# Patient Record
Sex: Male | Born: 1935 | Race: Black or African American | Hispanic: No | Marital: Married | Smoking: Former smoker
Health system: Southern US, Community
[De-identification: ages and names within clinical notes are randomized; demographics above are authoritative.]

## PROBLEM LIST (undated history)

## (undated) DIAGNOSIS — M1990 Unspecified osteoarthritis, unspecified site: Secondary | ICD-10-CM

## (undated) DIAGNOSIS — I10 Essential (primary) hypertension: Secondary | ICD-10-CM

## (undated) DIAGNOSIS — I429 Cardiomyopathy, unspecified: Secondary | ICD-10-CM

## (undated) DIAGNOSIS — I259 Chronic ischemic heart disease, unspecified: Secondary | ICD-10-CM

## (undated) DIAGNOSIS — I350 Nonrheumatic aortic (valve) stenosis: Secondary | ICD-10-CM

## (undated) DIAGNOSIS — E785 Hyperlipidemia, unspecified: Secondary | ICD-10-CM

## (undated) DIAGNOSIS — I2510 Atherosclerotic heart disease of native coronary artery without angina pectoris: Secondary | ICD-10-CM

## (undated) DIAGNOSIS — E119 Type 2 diabetes mellitus without complications: Secondary | ICD-10-CM

## (undated) DIAGNOSIS — I739 Peripheral vascular disease, unspecified: Secondary | ICD-10-CM

## (undated) HISTORY — DX: Unspecified osteoarthritis, unspecified site: M19.90

## (undated) HISTORY — DX: Peripheral vascular disease, unspecified: I73.9

## (undated) HISTORY — DX: Atherosclerotic heart disease of native coronary artery without angina pectoris: I25.10

## (undated) HISTORY — PX: NO PAST SURGERIES: SHX2092

## (undated) HISTORY — DX: Type 2 diabetes mellitus without complications: E11.9

## (undated) HISTORY — DX: Essential (primary) hypertension: I10

## (undated) HISTORY — DX: Nonrheumatic aortic (valve) stenosis: I35.0

## (undated) HISTORY — DX: Chronic ischemic heart disease, unspecified: I25.9

## (undated) HISTORY — DX: Cardiomyopathy, unspecified: I42.9

## (undated) HISTORY — DX: Hyperlipidemia, unspecified: E78.5

---

## 2003-09-22 ENCOUNTER — Ambulatory Visit (HOSPITAL_COMMUNITY): Admission: RE | Admit: 2003-09-22 | Discharge: 2003-09-22 | Payer: Self-pay | Admitting: Internal Medicine

## 2004-02-29 ENCOUNTER — Ambulatory Visit: Payer: Self-pay

## 2005-12-06 ENCOUNTER — Ambulatory Visit: Payer: Self-pay | Admitting: Cardiovascular Disease

## 2006-05-14 ENCOUNTER — Ambulatory Visit: Payer: Self-pay | Admitting: Cardiovascular Disease

## 2006-05-15 ENCOUNTER — Ambulatory Visit: Payer: Self-pay | Admitting: Vascular Surgery

## 2006-05-22 ENCOUNTER — Encounter (HOSPITAL_COMMUNITY): Admission: RE | Admit: 2006-05-22 | Discharge: 2006-06-21 | Payer: Self-pay | Admitting: Cardiovascular Disease

## 2006-05-22 ENCOUNTER — Ambulatory Visit: Payer: Self-pay | Admitting: Cardiovascular Disease

## 2006-07-05 ENCOUNTER — Ambulatory Visit: Payer: Self-pay | Admitting: Cardiovascular Disease

## 2006-12-04 ENCOUNTER — Ambulatory Visit: Payer: Self-pay | Admitting: Vascular Surgery

## 2007-01-28 ENCOUNTER — Ambulatory Visit: Payer: Self-pay | Admitting: Cardiovascular Disease

## 2007-06-11 ENCOUNTER — Ambulatory Visit: Payer: Self-pay | Admitting: Vascular Surgery

## 2007-07-31 ENCOUNTER — Ambulatory Visit: Payer: Self-pay | Admitting: Cardiovascular Disease

## 2007-08-05 ENCOUNTER — Emergency Department (HOSPITAL_COMMUNITY): Admission: EM | Admit: 2007-08-05 | Discharge: 2007-08-05 | Payer: Self-pay | Admitting: Emergency Medicine

## 2007-12-17 ENCOUNTER — Ambulatory Visit: Payer: Self-pay | Admitting: Vascular Surgery

## 2008-03-05 ENCOUNTER — Ambulatory Visit: Payer: Self-pay | Admitting: Cardiology

## 2008-03-06 DIAGNOSIS — I2510 Atherosclerotic heart disease of native coronary artery without angina pectoris: Secondary | ICD-10-CM | POA: Insufficient documentation

## 2008-03-06 DIAGNOSIS — I6529 Occlusion and stenosis of unspecified carotid artery: Secondary | ICD-10-CM | POA: Insufficient documentation

## 2008-03-06 DIAGNOSIS — E782 Mixed hyperlipidemia: Secondary | ICD-10-CM | POA: Insufficient documentation

## 2008-03-10 ENCOUNTER — Encounter: Payer: Self-pay | Admitting: Cardiology

## 2008-03-10 ENCOUNTER — Ambulatory Visit (HOSPITAL_COMMUNITY): Admission: RE | Admit: 2008-03-10 | Discharge: 2008-03-10 | Payer: Self-pay | Admitting: Cardiology

## 2008-03-10 ENCOUNTER — Ambulatory Visit: Payer: Self-pay | Admitting: Cardiology

## 2008-05-14 ENCOUNTER — Ambulatory Visit: Payer: Self-pay | Admitting: Cardiology

## 2008-05-14 ENCOUNTER — Observation Stay (HOSPITAL_COMMUNITY): Admission: EM | Admit: 2008-05-14 | Discharge: 2008-05-15 | Payer: Self-pay | Admitting: Emergency Medicine

## 2008-05-20 ENCOUNTER — Encounter (HOSPITAL_COMMUNITY): Admission: RE | Admit: 2008-05-20 | Discharge: 2008-06-19 | Payer: Self-pay | Admitting: Cardiology

## 2008-05-20 ENCOUNTER — Ambulatory Visit: Payer: Self-pay | Admitting: Cardiology

## 2008-06-02 ENCOUNTER — Encounter (INDEPENDENT_AMBULATORY_CARE_PROVIDER_SITE_OTHER): Payer: Self-pay | Admitting: *Deleted

## 2008-09-22 ENCOUNTER — Ambulatory Visit: Payer: Self-pay | Admitting: Vascular Surgery

## 2008-10-27 ENCOUNTER — Encounter (INDEPENDENT_AMBULATORY_CARE_PROVIDER_SITE_OTHER): Payer: Self-pay | Admitting: *Deleted

## 2008-10-27 LAB — CONVERTED CEMR LAB
ALT: 22 units/L
AST: 24 units/L
Bilirubin, Direct: 0.2 mg/dL
LDL Cholesterol: 56 mg/dL

## 2009-03-04 ENCOUNTER — Ambulatory Visit: Payer: Self-pay | Admitting: Vascular Surgery

## 2009-08-30 ENCOUNTER — Encounter (INDEPENDENT_AMBULATORY_CARE_PROVIDER_SITE_OTHER): Payer: Self-pay | Admitting: *Deleted

## 2009-09-02 ENCOUNTER — Ambulatory Visit: Payer: Self-pay | Admitting: Cardiology

## 2009-09-03 ENCOUNTER — Encounter: Payer: Self-pay | Admitting: Cardiology

## 2009-11-24 ENCOUNTER — Ambulatory Visit: Payer: Self-pay | Admitting: Vascular Surgery

## 2010-03-02 NOTE — Miscellaneous (Signed)
Summary: LABS LIVER,LIPID,A1C,10/27/2008  Clinical Lists Changes  Observations: Added new observation of ALBUMIN: 4.1 g/dL (04/54/0981 19:14) Added new observation of PROTEIN, TOT: 7.2 g/dL (78/29/5621 30:86) Added new observation of SGPT (ALT): 22 units/L (10/27/2008 16:04) Added new observation of SGOT (AST): 24 units/L (10/27/2008 16:04) Added new observation of ALK PHOS: 70 units/L (10/27/2008 16:04) Added new observation of BILI DIRECT: 0.2 mg/dL (57/84/6962 95:28) Added new observation of LDL: 56 mg/dL (41/32/4401 02:72) Added new observation of HDL: 40 mg/dL (53/66/4403 47:42) Added new observation of TRIGLYC TOT: 87 mg/dL (59/56/3875 64:33) Added new observation of CHOLESTEROL: 113 mg/dL (29/51/8841 66:06) Added new observation of HGB: 143 g/dL (30/16/0109 32:35) Added new observation of HGBA1C: 7.3 % (10/27/2008 16:04)

## 2010-03-02 NOTE — Assessment & Plan Note (Signed)
Summary: past due for f/u per [name]/tg  Medications Added PLAVIX 75 MG TABS (CLOPIDOGREL BISULFATE) TAKE 1 TAB DAILY VYTORIN 10-20 MG TABS (EZETIMIBE-SIMVASTATIN) TAKE 1 TAB DAILY METFORMIN HCL 500 MG TABS (METFORMIN HCL) TAKE 1 TAB DAILY METOPROLOL TARTRATE 50 MG TABS (METOPROLOL TARTRATE) TAKE 1/2 TAB two times a day AMLODIPINE BESYLATE 5 MG TABS (AMLODIPINE BESYLATE) TAKE 1 TAB DAILY ASPIR-TRIN 325 MG TBEC (ASPIRIN) TAKE 1 TAB DAILY      Allergies Added: NKDA  Visit Type:  Follow-up Primary Provider:  Dr. Butch Penny   History of Present Illness: 77 year old male presents for followup. He continues to deny any problems with angina and has NYHA class II dyspnea on exertion. Medical regimen is reviewed below. He had recent lab work with Dr. Renard Matter which we will review.  He continues to followup carotid artery disease with Dr. Edilia Bo, last duplex outlined below. He has moderate disease bilaterally.  No reports of orthopnea or PND. No palpitations or syncope. In general, LVEF has improved over time despite evidence of previous infarct scar in the inferior wall.  He remains comfortable with observation on medical therapy.    Current Medications (verified): 1)  Lisinopril 40 Mg Tabs (Lisinopril) .... Take One Tablet By Mouth Daily 2)  Plavix 75 Mg Tabs (Clopidogrel Bisulfate) .... Take 1 Tab Daily 3)  Vytorin 10-20 Mg Tabs (Ezetimibe-Simvastatin) .... Take 1 Tab Daily 4)  Metformin Hcl 500 Mg Tabs (Metformin Hcl) .... Take 1 Tab Daily 5)  Metoprolol Tartrate 50 Mg Tabs (Metoprolol Tartrate) .... Take 1/2 Tab Two Times A Day 6)  Amlodipine Besylate 5 Mg Tabs (Amlodipine Besylate) .... Take 1 Tab Daily 7)  Aspir-Trin 325 Mg Tbec (Aspirin) .... Take 1 Tab Daily  Allergies (verified): No Known Drug Allergies  Past History:  Social History: Last updated: 03/06/2008 Retired  Tobacco Use - Former.  Alcohol Use - no Regular Exercise - no  Past Medical  History: Arthritis CAD - based on Myoview, LVEF 43% Diabetes Type 2 Hyperlipidemia Myocardial Infarction - IMI Carotid artery disease - Dr. Edilia Bo Ischemic cardiomyopathy  Past Surgical History: Unremarkable  Review of Systems  The patient denies anorexia, fever, chest pain, syncope, dyspnea on exertion, peripheral edema, melena, and hematochezia.         Otherwise reviewed and negative.  Vital Signs:  Patient profile:   77 year old male Height:      67 inches Weight:      181 pounds BMI:     28.45 Pulse rate:   62 / minute BP sitting:   165 / 62  (right arm)  Vitals Entered By: Dreama Saa, CNA (September 02, 2009 11:30 AM)  Physical Exam  Additional Exam:  Normally nourished appearing male in no acute distress. HEENT: Conjunctiva and lids normal, oropharynx clear. Neck: Supple, no elevated JVP, bilateral carotid bruits, left greater than right. Lungs: Clear to auscultation, nonlabored. Cardiac: Regular rate and rhythm, no S3. Soft systolic murmur at the base. Abdomen: Soft, nontender, bowel sounds present. Extremities: No pitting edema.   Nuclear Study  Procedure date:  05/20/2008  Findings:      Scintigraphic Data: Acquisition notable for minor movement during   the resting portion of the study as well as mild diaphragmatic   attenuation.  Left ventricular size was at the upper limit of   normal.  On tomographic images reconstructed in standard planes,   there was a moderate sized defect  primarily involving the basilar   and mid inferolateral segments,  but extending inferoapically as   well.  By comparison to the resting portion of the study, this   defect was essentially fixed with no significant reversibility   appreciated.  The gated reconstruction demonstrated moderate to   severe hypokinesis of the inferior and inferoapical segments with   mild impairment in overall LV systolic function.  Estimated   ejection fraction was 0.43.  There was absent  systolic accentuation   of activity inferolaterally.    IMPRESSION:   Abnormal combined exercise and pharmacologic stress nuclear   myocardial study revealing impaired exercise capacity, chronotropic   incompetence, borderline left ventricular enlargement and the   mildly impaired LV systolic function in a segmental pattern.  There   was a moderate sized area of inferolateral infarction without   scintigraphic or electrocardiographic evidence for myocardial   ischemia.  Other findings as noted.   Echocardiogram  Procedure date:  03/10/2008  Findings:      SUMMARY   -  Overall left ventricular systolic function was normal. Left         ventricular ejection fraction was estimated to be 55 %.         Thinning and severe hypokinesis of the posterior wall. Left         ventricular wall thickness was moderately increased.   -  The aortic valve was mildly calcified. There was trivial aortic         valvular regurgitation.   -  There was mild fibrocalcific change of the aortic root.   -  There was mild mitral annular calcification. The effective         orifice of mitral regurgitation by proximal isovelocity         surface area was 0.03 cm^2. The volume of mitral         regurgitation by proximal isovelocity surface area was 7 cc.   -  Left atrial size was at the upper limits of normal.  Carotid Doppler  Procedure date:  03/04/2009  Findings:       IMPRESSION:   1. Bilateral internal carotid arteries suggests 40%-59% stenosis.   2. Greater than 50% stenosis of the left distal common carotid artery.   3. Antegrade flow in bilateral vertebrals.   4. Stable study in comparison to previous study.   EKG  Procedure date:  09/02/2009  Findings:      Sus rhythm at 63 beats per minute with LVH and repolarization abnormalities.  Impression & Recommendations:  Problem # 1:  CORONARY ATHEROSCLEROSIS NATIVE CORONARY ARTERY (ICD-414.01)  Symptomatically stable on medical therapy.  Continue observation. Myoview from last year was reviewed. Followup in 6 months.  His updated medication list for this problem includes:    Lisinopril 40 Mg Tabs (Lisinopril) .Marland Kitchen... Take one tablet by mouth daily    Plavix 75 Mg Tabs (Clopidogrel bisulfate) .Marland Kitchen... Take 1 tab daily    Metoprolol Tartrate 50 Mg Tabs (Metoprolol tartrate) .Marland Kitchen... Take 1/2 tab two times a day    Amlodipine Besylate 5 Mg Tabs (Amlodipine besylate) .Marland Kitchen... Take 1 tab daily    Aspir-trin 325 Mg Tbec (Aspirin) .Marland Kitchen... Take 1 tab daily  Problem # 2:  CAROTID ARTERY DISEASE (ICD-433.10)  Moderate bilateral disease, followed by Dr. Edilia Bo.  His updated medication list for this problem includes:    Plavix 75 Mg Tabs (Clopidogrel bisulfate) .Marland Kitchen... Take 1 tab daily    Aspir-trin 325 Mg Tbec (Aspirin) .Marland Kitchen... Take 1 tab daily  Problem # 3:  MIXED  HYPERLIPIDEMIA (ICD-272.2)  Followed by Dr. Renard Matter. Will request labs for review.  His updated medication list for this problem includes:    Vytorin 10-20 Mg Tabs (Ezetimibe-simvastatin) .Marland Kitchen... Take 1 tab daily  Patient Instructions: 1)  Your physician recommends that you schedule a follow-up appointment in: 6 MONTHS 2)  Your physician recommends that you continue on your current medications as directed. Please refer to the Current Medication list given to you today.

## 2010-03-02 NOTE — Letter (Signed)
Summary: progress note dr Renard Matter 07-28-09  progress note dr Renard Matter 07-28-09   Imported By: Faythe Ghee 09/03/2009 10:35:08  _____________________________________________________________________  External Attachment:    Type:   Image     Comment:   External Document

## 2010-05-11 LAB — GLUCOSE, CAPILLARY
Glucose-Capillary: 122 mg/dL — ABNORMAL HIGH (ref 70–99)
Glucose-Capillary: 130 mg/dL — ABNORMAL HIGH (ref 70–99)
Glucose-Capillary: 158 mg/dL — ABNORMAL HIGH (ref 70–99)

## 2010-05-11 LAB — POCT CARDIAC MARKERS
CKMB, poc: 6.8 ng/mL (ref 1.0–8.0)
Myoglobin, poc: 168 ng/mL (ref 12–200)
Myoglobin, poc: 206 ng/mL (ref 12–200)
Troponin i, poc: <0.05 ng/mL (ref 0.00–0.09)

## 2010-05-11 LAB — DIFFERENTIAL
Eosinophils Relative: 4 % (ref 0–5)
Lymphocytes Relative: 42 % (ref 12–46)
Lymphs Abs: 2.2 10*3/uL (ref 0.7–4.0)
Monocytes Absolute: 0.5 10*3/uL (ref 0.1–1.0)

## 2010-05-11 LAB — COMPREHENSIVE METABOLIC PANEL
ALT: 19 U/L (ref 0–53)
AST: 26 U/L (ref 0–37)
CO2: 24 mEq/L (ref 19–32)
Calcium: 9.6 mg/dL (ref 8.4–10.5)
Potassium: 3.7 mEq/L (ref 3.5–5.1)
Sodium: 137 mEq/L (ref 135–145)
Total Protein: 7.2 g/dL (ref 6.0–8.3)

## 2010-05-11 LAB — CARDIAC PANEL(CRET KIN+CKTOT+MB+TROPI): Relative Index: 2 (ref 0.0–2.5)

## 2010-05-11 LAB — CBC
MCV: 85.6 fL (ref 78.0–100.0)
Platelets: 160 10*3/uL (ref 150–400)
WBC: 5.3 10*3/uL (ref 4.0–10.5)

## 2010-05-25 ENCOUNTER — Other Ambulatory Visit (INDEPENDENT_AMBULATORY_CARE_PROVIDER_SITE_OTHER): Payer: Medicare Other

## 2010-05-25 DIAGNOSIS — I6529 Occlusion and stenosis of unspecified carotid artery: Secondary | ICD-10-CM

## 2010-06-01 NOTE — Procedures (Unsigned)
CAROTID DUPLEX EXAM  INDICATION:  Follow up carotid disease.  HISTORY: Diabetes:  Yes. Cardiac:  Yes. Hypertension:  Yes. Smoking:  No. Previous Surgery:  No. CV History: Amaurosis Fugax No, Paresthesias No, Hemiparesis No.                                      RIGHT               LEFT Brachial systolic pressure:         138                 140 Brachial Doppler waveforms:         WNL                 WNL Vertebral direction of flow:        Antegrade           Abnormal antegrade DUPLEX VELOCITIES (cm/sec) CCA peak systolic                   126                 95 (P), 319 (M) ECA peak systolic                   118                 177 ICA peak systolic                   69                  134 ICA end diastolic                   15                  29 PLAQUE MORPHOLOGY:                  Heterogenous/calcific                  Heterogenous/calcific PLAQUE AMOUNT:                      Mild                Moderate PLAQUE LOCATION:                    CCA/ECA/ICA         CCA/ECA/ICA  IMPRESSION: 1. 1% to 39% bilateral internal carotid artery stenosis, although left     is difficult to grade due to common carotid artery stenosis. 2. 50% to 75% left common carotid artery stenosis. 3. Abnormal antegrade left vertebral artery with essentially normal     left subclavian artery. 4. Right vertebral artery is within normal limits. 5. Stable results compared to previous study of 11/24/2009.  ___________________________________________ Di Kindle. Edilia Bo, M.D.  LT/MEDQ  D:  05/25/2010  T:  05/26/2010  Job:  [id number]

## 2010-06-14 NOTE — Assessment & Plan Note (Signed)
[facility]                       [place] CARDIOLOGY OFFICE NOTE   NAME:Grandmaison, KOLBEY TEICHERT                       MRN:          [id number]  DATE:01/28/2007                            DOB:          [birth year]    [name] is seen today in followup   The patient has a history of a coronary artery disease, previous  inferior wall MI in the 22s.  He also has apparently significant carotid  disease.  His last Myoview this year was nonischemic with an EF of 38%,  previous inferior wall infarction.   He has been doing well.  He has not had any significant chest pain.  He  has not had PND or orthopnea.  There has been no syncope or evidence of  ventricular arrhythmias.   He has not had any TIAs or CVA-like symptoms.   The patient sees Dr. Edilia Bo, apparently, for his carotid disease.  However, in talking to the patient he said the last two times he has  only had ultrasounds and has actually not seen a physician.  His last  carotid duplex did not apparently show significant ICA stenosis but  there was a question of greater than 80% left subclavian stenosis with  peak systolic velocities greater than 3 meters per second.  I told Huntley  that I wanted him to actually see Dr. Edilia Bo to go over his serial  studies.  The format in which CVTS does their exam in [facility] are  somewhat different and I am a little bit concerned.  I questioned him  closely regarding any possible symptoms of subclavian steal.  He has not  had any dizziness.  He does get pain in the left shoulder but this does  not appear to be due to ischemic arterial insufficiency.   REVIEW OF SYSTEMS:  Otherwise negative.   CURRENT MEDICATIONS:  1. Glucophage 500 mg a day.  2. An aspirin a day.  3. Vytorin 10/20.  4. Plavix 75 a day.  5. Metoprolol 25 b.i.d.  6. Lisinopril 40 a day.   EXAMINATION:  Remarkable for a healthy-appearing middle-aged black male  in no distress.  Blood pressures  are equal in each arm at 150/80.  There  is no discrepancy.  Pulse is 60 and regular, afebrile, weight 177,  respiratory rate 16.  HEENT:  Remarkable for a left subclavian bruit.  No JVD elevation,  lymphadenopathy, thyromegaly.  LUNGS:  Clear with good diaphragmatic motion.  No wheezing.  S1, S2 with normal heart sounds.  PMI normal.  ABDOMEN:  Benign, bowel sounds positive.  There is no bruit, no  hepatosplenomegaly, no hepatojugular reflux, no tenderness.  Distal  pulses are intact.  No edema.  NEUROLOGIC:  Nonfocal.  SKIN:  Warm and dry.  No muscular weakness.   IMPRESSION:  1. Coronary disease, previous inferior myocardial infarction,      nonischemic Myoview.  Follow up with Myoview again in March.      Continue aspirin and beta blocker.  2. History of carotid disease, not clearly defined as far as I can      tell.  He clearly has left subclavian stenosis but appears to be      asymptomatic from it with no blood pressure discrepancies.  At some      point in the future he may need a CTA.  I took the liberty of      calling CVTS to get him a followup with Dr. Edilia Bo.  He needs to      see a physician and not just have serial carotid duplexes.      Continue aspirin and Plavix.  3. Hypercholesterolemia.  LDL 60.  Continue Vytorin 10/20 in the face      of vascular disease.  4. Hypertension, currently better controlled.  Continue lisinopril 40      a day.  Low salt diet.   I will see him back in about 6 months.     Noralyn Pick. Eden Emms, MD, [facility]  Electronically Signed    PCN/MedQ  DD: 01/28/2007  DT: 01/28/2007  Job #: [id number]   cc:   Angus G. Renard Matter, MD  Di Kindle. Edilia Bo, M.D.

## 2010-06-14 NOTE — Assessment & Plan Note (Signed)
[facility]                       [place] CARDIOLOGY OFFICE NOTE   NAME:Charles Lester, Charles Lester                       MRN:          [id number]  DATE:03/05/2008                            DOB:          03/05/1924    PRIMARY CARE PHYSICIAN:  Charles G. Renard Matter, MD   REASON FOR VISIT:  Routine followup.   HISTORY OF PRESENT ILLNESS:  Charles Lester was last seen in the office by  Charles Lester in July 2009.  He has a history of cardiovascular disease  status post previous inferior wall myocardial infarction resulting in  ischemic cardiomyopathy, ejection fraction of 38% based on Myoview from  April 2008.  At that time, he had an evidence of inferior wall scar  without ischemia and does not report any active anginal symptoms.  He  has NYHA class II, dyspnea on exertion, and no orthopnea.  He had no  palpitations or syncope.  Medications were reviewed and are outlined  below.  These have been stable.  His electromyogram today shows sinus  rhythm at 60 beats per minute with anterolateral ST-segment elevation  which is old and likely reflective of repolarization abnormalities.  These also extend to the inferior leads and are stable.  Lipid control  has been good based on old labs and Charles Lester states that Charles Lester  has been following this closely.  Carotid artery disease is now being  followed by Charles Lester who saw Charles Lester in November.  He has  moderate bilateral disease, more prominent on the left.   ALLERGIES:  No known drug allergies.   MEDICATIONS:  1. Enteric-coated aspirin 325 mg p.o. daily.  2. Vytorin 10/20 mg p.o. daily.  3. Plavix 75 mg p.o. daily.  4. Metoprolol 25 mg p.o. b.i.d.  5. Lisinopril 40 mg p.o. daily.  6. Glucophage XR 500 mg p.o. daily.   REVIEW OF SYSTEMS:  As described in history of present illness.  No  focal weakness, speech problems, or visual problems.  He reports chronic  leg pain.  Peripheral edema.  Otherwise  negative.   PHYSICAL EXAMINATION:  VITAL SIGNS:  Blood pressure 138/80, heart rate  is 60, weight is 183 pounds which is stable.  GENERAL:  The patient is comfortable in no acute distress.  HEENT:  Conjunctivae are normal.  Oropharynx is clear.  NECK:  Supple.  No jugular venous pressure and left carotid bruit is  noted.  No thyromegaly.  LUNGS:  Clear.  Diminished breath sounds.  CARDIAC:  Regular rate and rhythm.  2/6 systolic murmur at the base,  preserved second heart sound.  No S3 gallop or pericardial rub.  ABDOMEN:  Soft, nontender.  Normoactive bowel sounds.  EXTREMITIES:  Exhibit no frank pitting edema.  Distal pulses are 1+.  SKIN:  Warm and dry.  MUSCULOSKELETAL:  No kyphosis noted.  NEUROPSYCHIATRIC:  The patient is alert and oriented x3.  Affect is  appropriate.   IMPRESSION AND RECOMMENDATIONS:  1. Ischemic cardiomyopathy with an ejection fraction of 38% based on      Myoview from April 2008.  At that time, he had evidence  of inferior      wall scar without ischemia and has had no progressive symptoms.  I      plan to continue medical therapy and follow up with her 2-D      echocardiogram for reassessment of cardiac function.  He will see      [place] back over the next 6 months.  2. Hyperlipidemia and diabetes mellitus, followed by Charles Lester.  3. Bilateral carotid artery disease, followed by Charles Lester.     Charles Sidle, MD  Electronically Signed    SGM/MedQ  DD: 03/05/2008  DT: 03/06/2008  Job #: [id number]   cc:   Charles G. Renard Matter, MD

## 2010-06-14 NOTE — Procedures (Signed)
CAROTID DUPLEX EXAM   INDICATION:  Carotid artery disease.   HISTORY:  Diabetes:  Yes.  Cardiac:  MI.  Hypertension:  Yes.  Smoking:  Previous.  Previous Surgery:  No.  CV History:  Currently asymptomatic.  Amaurosis Fugax No, Paresthesias No, Hemiparesis No                                       RIGHT             LEFT  Brachial systolic pressure:         156               150  Brachial Doppler waveforms:         Normal            Normal  Vertebral direction of flow:        Antegrade         Antegrade  DUPLEX VELOCITIES (cm/sec)  CCA peak systolic                   92                P=87/D=306  ECA peak systolic                   61                145  ICA peak systolic                   115               123  ICA end diastolic                   28                26  PLAQUE MORPHOLOGY:                  Mixed             Mixed  PLAQUE AMOUNT:                      Mild              Moderate  PLAQUE LOCATION:                    ICA/ECA           ICA/ECA/CCA   IMPRESSION:  1. A 40% to 59% stenosis of the bilateral internal carotid arteries.  2. Doppler velocities suggest a greater than 50% stenosis of the left      distal common carotid artery.  3. No significant change in the Doppler velocities when compared to      the previous studies from 2009 and 2008.   ___________________________________________  Di Kindle. Edilia Bo, M.D.   CH/MEDQ  D:  09/22/2008  T:  09/22/2008  Job:  [id number]

## 2010-06-14 NOTE — Assessment & Plan Note (Signed)
[facility]                       [place] CARDIOLOGY OFFICE NOTE   NAME:Charles Lester                       MRN:          [id number]  DATE:07/31/2007                            DOB:          [birth year]    [name] is seen today in followup.  He has previous inferior wall  MI with ischemic cardiomyopathy and EF in the 38% range.   He also has known moderate bilateral carotid disease being followed by  Dr. Edilia Bo.  I reviewed his last carotid duplex study from Jun 18, 2007, and it had 40-59% bilateral ICA stenosis; however, he also had a  60% stenosis in the left distal common carotid, which may cause  underestimation of the left sided ICA stenosis.   He is on aspirin and Plavix and not having any TIAs.  He is not having  significant chest pain.   His activity is limited by left hip pain.  He has had it for 3-4 days.  He is on Naprosyn for it.  He is following it up with Dr. Renard Matter.  I  encouraged him to follow up with Dr. Romeo Apple if it worsens since Priyansh  likes to stay locally in [place] and [place].   REVIEW OF SYSTEMS:  Otherwise negative.   MEDICATIONS:  1. Glucophage 500 a day.  2. Aspirin a day.  3. Plavix 75 a day.  4. Metoprolol 25 b.i.d.  5. Lisinopril 40 a day.   PHYSICAL EXAMINATION:  GENERAL:  Remarkable for healthy-appearing  elderly black male who looks younger than his stated age.  Affect  appropriate.  VITAL SIGNS:  Weight 182, blood pressure 146/76, pulse 60 and regular,  respiratory rate 14, and afebrile.  HEENT:  Unremarkable.  NECK:  Bilateral carotid bruits, left greater than right.  No  lymphadenopathy, thyromegaly, or JVP elevation.  LUNGS:  Clear with good diaphragmatic motion.  No wheezing.  S1 and S2  with normal heart sounds.  PMI normal.  ABDOMEN:  Benign.  Bowel sounds positive.  No AAA.  No tenderness.  No  bruit.  No hepatosplenomegaly or hepatojugular reflux.  Distal pulses  are intact.  No  edema.  NEURO:  Nonfocal.  SKIN:  Warm and dry.  No muscular weakness.   His EKG shows sinus rhythm with previous inferior wall MI and occasional  PVCs.   Carotid duplex exam from Dr. Adele Dan office reviewed.   IMPRESSION:  1. Coronary artery disease, ischemic cardiomyopathy, not having chest      pain, nonischemic Myoview.  Continue aspirin, Plavix, and beta-      blocker.  2. Hypertension, currently well controlled.  Continue lisinopril and      low-salt diet.  3. Hyperlipidemia.  Continue Vytorin 10/20.  Lipid and liver profile      in 6 months.  His last set of LFTs that I have from November 2008      were normal and his LDL was 60.  4. Carotid disease.  Continue aspirin and Plavix.  Followup duplex in      6 months with Dr. Edilia Bo.  5. Diabetes.  Hemoglobin A1c  quarterly.  Follow up with Dr. Renard Matter.      Continue Glucophage.  6. Left hip pain, likely arthritic.  Follow up with Dr. Renard Matter.      Referral to Dr. Romeo Apple for possible cortisone shot if needed.   He will be seen in the [employer] in 6 months.     Noralyn Pick. Eden Emms, MD, [facility]  Electronically Signed    PCN/MedQ  DD: 07/31/2007  DT: 08/01/2007  Job #: [id number]

## 2010-06-14 NOTE — Procedures (Signed)
CAROTID DUPLEX EXAM   INDICATION:  Follow-up evaluation of known carotid artery disease.   HISTORY:  Diabetes:  Yes.  Cardiac:  MI in 1990.  Hypertension:  Yes.  Smoking:  Former smoker.  Previous Surgery:  No.  CV History:  Previous duplex revealed 40-59% ICA stenosis bilaterally.  Amaurosis Fugax No, Paresthesias No, Hemiparesis No.                                       RIGHT             LEFT  Brachial systolic pressure:         159               158  Brachial Doppler waveforms:         Triphasic         Triphasic  Vertebral direction of flow:        Antegrade         Antegrade  DUPLEX VELOCITIES (cm/sec)  CCA peak systolic                   82                234  ECA peak systolic                   95                178  ICA peak systolic                   112               112  ICA end diastolic                   29                34  PLAQUE MORPHOLOGY:                  Soft              Calcified  PLAQUE AMOUNT:                      Mild              Moderate  PLAQUE LOCATION:                    Proximal ICA, distal CCA            Distal CCA, proximal  ICA, proximal ECA   IMPRESSION:  1. A 40-59% stenosis bilaterally.  2. Greater than 50% distal left CCA stenosis.  3. Left ECA stenosis.  4. No significant change from previous study.   ___________________________________________  Di Kindle. Edilia Bo, M.D.   MC/MEDQ  D:  12/17/2007  T:  12/17/2007  Job:  [id number]

## 2010-06-14 NOTE — Procedures (Signed)
CAROTID DUPLEX EXAM   INDICATION:  Follow up known carotid artery disease.   HISTORY:  Diabetes:  Yes.  Cardiac:  MI in 1990.  Hypertension:  Yes.  Smoking:  Quit about 10 years ago.  Previous Surgery:  CV History:  Amaurosis Fugax No, Paresthesias No, Hemiparesis No                                       RIGHT             LEFT  Brachial systolic pressure:         210               210  Brachial Doppler waveforms:         Biphasic          Biphasic  Vertebral direction of flow:        Antegrade         Antegrade  DUPLEX VELOCITIES (cm/sec)  CCA peak systolic                   98                282 (distal)  ECA peak systolic                   107               110  ICA peak systolic                   124               148  ICA end diastolic                   37                27  PLAQUE MORPHOLOGY:                  Heterogeneous     Heterogeneous  PLAQUE AMOUNT:                      Mild              Moderate  PLAQUE LOCATION:                    ICA and ECA       ICA and ECA   IMPRESSION:  1. Stenosis 40 to 59% noted in bilateral ICAs.  2. Approximately greater than 60% stenosis noted in the left distal      CCA.  3. Antegrade bilateral vertebral arteries.   ___________________________________________  Di Kindle. Edilia Bo, M.D.   MG/MEDQ  D:  06/11/2007  T:  06/11/2007  Job:  [id number]

## 2010-06-14 NOTE — Procedures (Signed)
CAROTID DUPLEX EXAM   INDICATION:  Carotid stenosis.   HISTORY:  Diabetes:  Yes.  Cardiac:  MI 15 years ago.  Hypertension:  Yes.  Smoking:  No.  Previous Surgery:  No.  CV History:  Currently asymptomatic.  Amaurosis Fugax , Paresthesias , Hemiparesis                                       RIGHT             LEFT  Brachial systolic pressure:         172               168  Brachial Doppler waveforms:         Within normal limits                Within normal limits  Vertebral direction of flow:        Antegrade         Antegrade  DUPLEX VELOCITIES (cm/sec)  CCA peak systolic                   103               P = 70, M = 381  ECA peak systolic                   124               184  ICA peak systolic                     114               121  ICA end diastolic                   29                25  PLAQUE MORPHOLOGY:                  Heterogenous      Heterogenous  PLAQUE AMOUNT:                      Mild              Moderate  PLAQUE LOCATION:                    ICA, bifurcation  CCA, ICA   IMPRESSION:  1. Bilateral internal carotid artery velocities suggest 20% to 39%      stenosis.  2. >50% stenosis of the left mid/distal common carotid artery.  3. Left vertebral artery shown with tardus parvus waveforms.   ___________________________________________  Di Kindle. Edilia Bo, M.D.   EM/MEDQ  D:  11/24/2009  T:  11/24/2009  Job:  [id number]

## 2010-06-14 NOTE — Procedures (Signed)
CAROTID DUPLEX EXAM   INDICATION:  Followup of known carotid artery disease.   HISTORY:  Diabetes:  Yes  Cardiac:  MI in 1990  Hypertension:  Yes  Smoking:  Quit 9 year ago                                       RIGHT             LEFT  Brachial systolic pressure:         150               160  Brachial Doppler waveforms:         Biphasic          Biphasic  Vertebral direction of flow:        Antegrade         Antegrade  DUPLEX VELOCITIES (cm/sec)  CCA peak systolic                   78                301 (distal)  ECA peak systolic                   119               165  ICA peak systolic                   95                61  ICA end diastolic                   25                17  PLAQUE MORPHOLOGY:                  Heterogenous      Heterogenous  PLAQUE AMOUNT:                      Mild              Mild  PLAQUE LOCATION:                    ICA and CCA       ICA and CCA   IMPRESSION:  1-30% stenosis noted in bilateral ICAs.  Possibly greater  than 60% stenosis (355/96) noted in the left proximal subclavian artery.  Antegrade bilateral vertebral artery spread. Increased velocity (301/22)  noted in the left distal CCA.   ___________________________________________  Di Kindle. Edilia Bo, M.D.   MG/MEDQ  D:  12/04/2006  T:  12/05/2006  Job:  [id number]

## 2010-06-14 NOTE — Group Therapy Note (Signed)
NAME:  Charles Lester, Charles Lester                ACCOUNT NO.:  [id number]   MEDICAL RECORD NO.:  [id number]          PATIENT TYPE:  OBV   LOCATION:  A302                          FACILITY:  [facility]   PHYSICIAN:  Angus G. Renard Matter, MD   DATE OF BIRTH:  03-12-1924   DATE OF PROCEDURE:  DATE OF DISCHARGE:  05/15/2008                                 PROGRESS NOTE   This patient has a history of coronary artery disease with previous  inferior wall myocardial infarction and ischemic cardiomyopathy.  He  developed numbness in his left arm stating that this was something he  had with his previous heart attack.  He entered through the ED and was  felt possibly to be admitted to the hospital as to rule out his coronary  artery disease and pending myocardial infarction.  The patient remains  fairly comfortable.   OBJECTIVE:  VITAL SIGNS:  Blood pressure 151/74, respirations 20, pulse  56, and temperature 97.1.  HEART:  Regular rhythm.  No murmurs.  LUNGS:  Clear to P&A.  ABDOMEN:  No palpable organs or masses.   Cardiac markers; CPK 96, CPK-MB 2.2, and troponin 0.02.   ASSESSMENT:  The patient does have a history of coronary artery disease  with previously diagnosed ischemic cardiomyopathy.   PLAN:  To continue current medications.  We will obtain Cardiology  consult today.      Angus G. Renard Matter, MD  Electronically Signed     AGM/MEDQ  D:  05/15/2008  T:  05/15/2008  Job:  [id number]

## 2010-06-14 NOTE — Procedures (Signed)
CAROTID DUPLEX EXAM   INDICATION:  Followup evaluation of known carotid artery disease.   HISTORY:  Diabetes:  Yes.  Cardiac:  MI 15 years ago.  Hypertension:  Yes.  Smoking:  No.  Previous Surgery:  No.  CV History:  No.  Amaurosis Fugax No, Paresthesias No, Hemiparesis No                                       RIGHT             LEFT  Brachial systolic pressure:         180               200  Brachial Doppler waveforms:         WNL               WNL  Vertebral direction of flow:        Antegrade         Antegrade  DUPLEX VELOCITIES (cm/sec)  CCA peak systolic                   104               87 mid, 301 distal  ECA peak systolic                   114               162  ICA peak systolic                   132               121  ICA end diastolic                   40                18  PLAQUE MORPHOLOGY:                  Heterogeneous     Heterogeneous  PLAQUE AMOUNT:                      Mild              Mild  PLAQUE LOCATION:                    ICA               ICA, ECA, CCA   IMPRESSION:  1. Bilateral internal carotid arteries suggests 40%-59% stenosis.  2. Greater than 50% stenosis of the left distal common carotid artery.  3. Antegrade flow in bilateral vertebrals.  4. Stable study in comparison to previous study.   ___________________________________________  Di Kindle. Edilia Bo, M.D.   CB/MEDQ  D:  03/04/2009  T:  03/04/2009  Job:  [id number]

## 2010-06-14 NOTE — H&P (Signed)
NAME:  Charles Lester, Charles Lester.             ACCOUNT NO.:  [id number]   MEDICAL RECORD NO.:  [id number]         PATIENT TYPE:  PINP   LOCATION:  A302                          FACILITY:  [facility]   PHYSICIAN:  Angus G. McInnis, MD   DATE OF BIRTH:  11/26/1924   DATE OF ADMISSION:  05/14/2008  DATE OF DISCHARGE:  04/16/2010LH                              HISTORY & PHYSICAL   HISTORY OF PRESENT ILLNESS:  An 77 year old Afro-American male with a  prior history of coronary artery disease with previous inferior wall  myocardial infarction, ischemic cardiomyopathy, 38% ejection fraction,  who developed numbness in his left arm which began yesterday.  He stated  this  was similar type symptoms that he had at the time of his  previous  myocardial infarction.  He was seen and evaluated by ED physician.  The  cardiac markers showed an MB of 6.8 with  troponin less than 0.05,  myoglobin 206 initially and subsequently MB was 4.9, troponin 0.05.  Electrolytes were essentially normal.  A CT of the head without contrast  was done which showed no acute intracranial process.  However, CT of the  abdomen showed acute infarction.  A chest x-ray was essentially  negative.  EKG showed sinus rhythm with sinus arrhythmia, first degree  AV block, ST elevation, T-wave abnormality, consider lateral ischemia  and with these problems present, it was felt he should be admitted at  least for observation.   SOCIAL HISTORY:  The patient does not smoke or drink alcohol or use  drugs.   PAST MEDICAL HISTORY:  The patient has history of hypertension,  diabetes, hyperlipidemia, and also a prior history of myocardial  infarction and ischemic cardiomyopathy with 38% ejection fraction.  The  patient has non-insulin-dependent diabetes and bilateral carotid artery  disease.   PAST SURGICAL HISTORY:  The patient has no prior history of surgery.   ALLERGIES:  No known drug allergies.   MEDICATION LIST:  1. Plavix 75 mg daily.  2.  Aspirin 81 mg daily.  3. Vytorin 10/20 at bedtime.  4. Amlodipine 5 mg at bedtime.  5. Lisinopril 40 mg daily.  6. Metoprolol tartrate 50 mg b.i.d.  7. Metformin 500 mg once a day.   REVIEW OF SYSTEMS:  HEENT:  Negative.  CARDIOPULMONARY:  No cough,  hemoptysis, or dyspnea.  GI:  No bowel irregularity or bleeding.  GU:  No dysuria or hematuria.  NEUROLOGIC:  The patient complains of numbness  in the left arm.   PHYSICAL EXAMINATION:  GENERAL:  The patient is alert.  VITAL SIGNS:  Blood pressure 152/73, heart rate 58, respirations 12, and  temperature 100.  HEENT:  Eyes, PERRLA.  TMs negative.  Oropharynx benign.  NECK:  Supple.  No JVD or thyroid abnormalities.  HEART:  Regular rhythm.  No murmurs.  LUNGS:  Clear to P and A.  ABDOMEN:  No palpable organs or masses or organomegaly.  EXTREMITIES:  Free of edema.  NEUROLOGIC:  No motor abnormalities or sensory disturbance except for  slight numbness in the left arm.  SKIN:  Warm and dry.   ASSESSMENT:  The patient does have a history of coronary artery disease  and ischemic cardiomyopathy and hyperlipidemia.  The patient also has  bilateral carotid disease.   PLAN:  To admit the patient to rule out acute coronary event.      Angus G. Renard Matter, MD  Electronically Signed     AGM/MEDQ  D:  05/14/2008  T:  05/15/2008  Job:  [id number]

## 2010-06-14 NOTE — Assessment & Plan Note (Signed)
OFFICE VISIT   JUANYA, VILLAVICENCIO  DOB:  September 08, 1924                                       06/11/2007  OZHYQ#:[id number]   I saw the patient in the office today for continued followup of his  carotid disease.  He has a moderate distal left common carotid artery  stenosis which I have been following.  Since I saw him last he has had  no history of stroke, TIAs, expressive or receptive aphasia or amaurosis  fugax.   His only complaint has been some bilateral lower extremity claudication  which occurs at approximately half a block.  His symptoms are relieved  with rest and aggravated by ambulation.  He has had no history of rest  pain and no history of nonhealing wounds.  His symptoms have remained  fairly stable over the last 6 months.   PAST MEDICAL HISTORY:  Significant for type 2 diabetes, hypertension,  hypercholesterolemia and a history of a myocardial infarction  approximately 20 years ago.  He denies any history of congestive heart  failure or history of emphysema.   FAMILY HISTORY:  There is no history of premature cardiovascular  disease.   SOCIAL HISTORY:  He has four children.  He is retired.  He quit tobacco  in 1999.   MEDICATIONS:  List is in the chart as documented.   REVIEW OF SYSTEMS:  VASCULAR:  He has had bilateral lower extremity  claudication as described above.  He has had no history of DVT or  phlebitis.  NEURO:  He had no dizziness, blackouts, headaches or seizures.  GENERAL:  He has had no weight loss, weight gain, problem with his  appetite or fever.  CARDIAC:  He has had no chest pain, chest pressure, palpitations or  arrhythmias.  He has had no murmur that he is aware of.  PULMONARY, GI, GU, NEUROLOGIC, PSYCHIATRIC, ENT and HEMATOLOGIC review  of systems unremarkable.   PHYSICAL EXAMINATION:  General:  This is a pleasant 77 year old  gentleman who appears stated age.  Vital signs:  Blood pressure is  185/80, heart rate is  59.  Neck:  Is supple.  There is no cervical  lymphadenopathy.  He has bilateral carotid bruits.  Lungs:  Are clear  bilaterally to auscultation.  Cardiac:  He has a regular rate and  rhythm.  Abdomen:  His abdomen is soft and nontender.  He has normal  pitched bowel sounds.  He has normal femoral pulses.  I cannot palpate  popliteal or pedal pulses on either side.  He has no ischemic ulcers on  his feet.  Both feet appear adequately perfused.  He has no significant  lower extremity swelling.  Neurologic:  Exam is nonfocal.  He has good  strength in his upper extremities and lower extremities bilaterally with  no significant paresthesias.   Doppler study in our office today shows a bilateral 40-59% internal  carotid artery stenosis.  He has a 60-79% left distal common carotid  artery stenosis.  The velocities on the left have not changed  significantly in the last 6 months.   With respect to his carotid disease he understands we would not consider  left carotid endarterectomy unless the stenosis progressed to greater  than 80% or he developed new neurologic symptoms.  I have reviewed the  potential symptoms of cerebrovascular disease with  the patient.  I plan  on a followup carotid duplex scan in 6 months and I will see him back at  that time.   With respect to his claudication we have discussed the importance of a  structured walking program in detail in the office today.  Fortunately  he is not a smoker.  He seems motivated to get on a structured walking  program and we will check some baseline ABIs when he comes back in 6  months.  He knows to call sooner if he has problems.   Di Kindle. Edilia Bo, M.D.  Electronically Signed   CSD/MEDQ  D:  06/11/2007  T:  06/12/2007  Job:  [id number]   cc:   Charlton Haws, MD

## 2010-06-14 NOTE — Assessment & Plan Note (Signed)
[facility]                        CARDIOLOGY OFFICE NOTE   NAME:Charles Lester, Charles Lester                       MRN:          [id number]  DATE:07/05/2006                            DOB:          [birth year]    Charles Lester returns today for followup.  I last saw him in April 2008.   The patient has multiple cardiac issues.  He has had a previous inferior  wall MI in the late 80s.  He has significant carotid disease.  He also  has hypertension and diabetes.   Since I last saw Charles Lester, we did a stress Myoview study on him.  He had an  old inferior wall infarction with ejection fraction of 38%.  He has not  had any significant chest pains.  I talked to Charles Lester at length regarding  some fluctuation in his ejection fraction.  It had been previously 45%  by echo, and 45% by [facility] in July 2005.  It is now 38%.  I think it is  important to quantitate his ejection fraction in the setting of ischemic  cardiomyopathy, and we may do a cardiac MRI on him in 6 months.   In regards to his carotid disease, he has had a history of 60% to 80%  left internal carotid artery stenosis, as well as right.  He had a  followup duplex scan with Charles Lester a few weeks ago, and we need to  get these results.   He has not had significant TIA or CVA-type symptoms.   Last time I saw him, he was quite hypertensive.  We increased his  lisinopril to 40 mg a day.  He has been doing okay with this.   REVIEW OF SYSTEMS:  Otherwise negative.   His medications include:  1. Glucophage 500 mg a day.  2. Aspirin a day.  3. Lisinopril 40 a day.  4. Plavix 75 a day.  5. Lopressor 25 b.i.d.  6. Vytorin 10/20.   EXAMINATION:  Remarkable for a healthy-appearing black male in no  distress.  He appears younger than his stated age.  Affect is jovial.  VITAL SIGNS:  Remarkable for a weight of 186.  Blood pressure 155/60,  which is much better than his previous systolic of 190.  Respiratory  rate 14.   Pulse is 66 and regular.  He is afebrile.  HEENT:  Normal.  Carotids have faint bilateral bruits.  There is no parvus, no tardus.  No JVP elevation.  No thyromegaly.  LUNGS:  Clear with normal diaphragmatic motion, and no wheezing.  There is an S1 and S2 with a systolic ejection murmur.  PMI is normal.  ABDOMEN:  Benign.  There is no tenderness.  No organomegaly.  Bowel  sounds are positive.  There is no AAA.  No hepatosplenomegaly.  No  hepatojugular reflux.  Distal pulses are intact with no edema.  There is no femoral bruit.  PTs  are +2 bilaterally.  NEUROLOGIC:  Nonfocal.  There is no muscular weakness.  SKIN:  Warm and dry.   IMPRESSION:  1. Coronary artery disease with previous inferior wall myocardial  infarction.  Nonischemic Myoview with fairly significant old      inferior wall myocardial infarction.  Ejection fraction now      supposedly 38% by Myoview.  Probable followup cardiac MRI in 6      months, and consideration for the Determine Trial.  2. Hypertension.  Improved with increased lisinopril.  Continue this,      beta blocker, as well as low-salt diet.  3. Carotid disease.  We will get the records from Dr. Edilia Lester faxed to      [place].  As long as his stenoses are under 80%, he will have a followup      duplex in 6 months to a year.  4. In terms of risk factor modification, we will continue his Vytorin.      He will have a followup lipid and liver profile in 6 months.  5. In regards to his diabetes, he will continue his Glucophage.  There      has been no evidence of acidosis.  Charles Lester will follow his      hemoglobin A1c quarterly.   Otherwise, I think Charles Lester is doing well, and I will see him back in 6  months before his cardiac MRI.     Charles Lester. Eden Emms, MD, [facility]  Electronically Signed    PCN/MedQ  DD: 07/05/2006  DT: 07/05/2006  Job #: [id number]

## 2010-06-14 NOTE — Assessment & Plan Note (Signed)
OFFICE VISIT   BASEL, DEFALCO  DOB:  02-Aug-1924                                       12/17/2007  BJYNW#:[id number]   Send copy of carotid duplex scan to Dr. Noralyn Pick. Nishan.  I saw the  patient in the office today for continued followup of his peripheral  vascular disease and carotid disease.  I have been following moderate  stenosis of the distal left common carotid artery and moderate bilateral  internal carotid artery disease.  Since I saw him last in May of this  year he denies any history of stroke, TIAs, expressive or receptive  aphasia or amaurosis fugax.   He also has a history of a stable bilateral lower extremity  claudication.  He experiences pain in both calves associated with  ambulation and relieved with rest.  He has had no significant hip or  thigh claudication.  He has had no rest pain or nonhealing ulcers.  He  feels that his symptoms in his legs have remained stable over the last 6  months.   REVIEW OF SYSTEMS:  On review of systems he has had no recent chest  pain, chest pressure, palpitations or arrhythmias.  He has had no  bronchitis, asthma or wheezing.   PHYSICAL EXAMINATION:  On physical examination this is a pleasant 77-  year-old gentleman who appears his stated age.  His blood pressure is  146/69, heart rate is 62.  Neck is supple.  He has bilateral carotid  bruits.  Lungs are clear bilaterally to auscultation.  On cardiac exam  he has a regular rate and rhythm.  Abdomen is soft and nontender.  I  cannot palpate an aneurysm.  He has palpable femoral pulses bilaterally.  I cannot palpate pedal pulses however both feet are warm and well-  perfused without ischemic ulcers.  Neurologic exam is nonfocal.   ABIs in our office today showed an ABI of 63% on the right and 73% on  the left.  These are stable compared to his previous study.   His carotid duplex scan shows a 40-59% internal carotid artery stenosis  bilaterally.   These velocities are stable.  He has a left common carotid  artery stenosis in the 60-79% range.  The velocities in his distal left  common carotid artery stenosis, however, have improved compared to a  study 6 months ago.   I have recommend we extend his followup out to 9 months and I will see  him back at that time with a carotid duplex scan.  He knows to call  sooner if he has problems.  In the meantime he knows to continue taking  his aspirin.   Di Kindle. Edilia Bo, M.D.  Electronically Signed   CSD/MEDQ  D:  12/17/2007  T:  12/18/2007  Job:  [id number]   cc:   Noralyn Pick. Eden Emms, MD, [facility]

## 2010-06-14 NOTE — Consult Note (Signed)
NAME:  JEP, DYAS                ACCOUNT NO.:  [id number]   MEDICAL RECORD NO.:  [id number]          PATIENT TYPE:  OBV   LOCATION:  A302                          FACILITY:  [facility]   PHYSICIAN:  Maisie Fus C. Wall, MD, FACCDATE OF BIRTH:  [birth year]   DATE OF CONSULTATION:  05/15/2008  DATE OF DISCHARGE:                                 CONSULTATION   CARDIOLOGIST:  Jonelle Sidle, M.D.   REASON FOR CONSULTATION:  Left arm pain.   HISTORY OF PRESENT ILLNESS:  Mr. Kyel Purk is an 77 year old male  patient with a history of coronary disease, status post remote inferior  myocardial infarction in 1988, and ischemic cardiomyopathy, with  evidence of an EF of 38% by nuclear study in 2008, who recently  established with Dr. Diona Browner who set him up for an echocardiogram that  demonstrated normalized LV function with an EF of 55% in February 2010.  He has been in his usual state of health until about two days ago when  he developed left arm numbness.  He describes it as intermittent.  It is  not exertional.  It lasts for just a few minutes.  He notes an  associated sensation in his left chest and axilla, described as  something moving.  He thinks it was related to constipation.  No  aggravating symptoms have been described.  His symptoms sometimes  improve with positional changes.  He denies associated shortness of  breath, nausea or diaphoresis.  He denies associated chest discomfort.  He has had no recent history of exertional chest pain or dyspnea with  exertion.  He had left arm symptoms somewhat similar to this in 1988,  when he had his myocardial infarction.  He became concerned and  eventually presented to the emergency room for further evaluation and  treatment.   PAST MEDICAL HISTORY:  1. Coronary artery disease, status post inferior myocardial infarction      1988.      a.     Myoview study in April 2008:  EF 38%; inferior scar; no       ischemia.      b.     Echocardiogram  in February 2010:  EF 55%; posterior       hypokinesis; moderate LVH.  2. Carotid stenosis with bilateral ICA stenosis of 40-59% by Dopplers      in November 2009.      a.     Followed by Dr. Di Kindle. Edilia Bo.  3. Peripheral arterial disease.      a.     ABI's in 2009:  Right 0.63; left 0.73.      b.     History of stable claudication.      c.     Followed by Dr. Edilia Bo.  4. Hypertension.  5. Hyperlipidemia,  6. Diabetes mellitus.   MEDICATIONS AT HOME:  1. Plavix 75 mg daily,  2. Aspirin 81 mg daily.  3. Vytorin 10/20 mg daily.  4. Amlodipine 5 mg daily.  5. Lisinopril 40 mg daily.  6. Metoprolol 50 mg daily.  7. Metformin 5 mg  daily.   ALLERGIES:  NO KNOWN DRUG ALLERGIES.   SOCIAL HISTORY:  The patient lives in [place] with his wife.  He is an ex-  smoker.  He denies alcohol abuse.   FAMILY HISTORY:  Insignificant for premature CAD.   REVIEW OF SYSTEMS:  CONSTITUTIONAL:  Please see HPI.  Denies fevers,  headache.  LUNGS:  Denies cough.  GI:  Denies dysuria, hematuria, bright blood per or melena, dysphagia.  CARDIOVASCULAR:  Denies orthopnea, PND, pedal edema, syncope or near  syncope.  He does have stable intermittent claudication.  All other  systems reviewed and negative.   PHYSICAL EXAMINATION:  GENERAL:  He is a well-nourished, well-developed  male in no distress.  VITAL SIGNS:  Blood pressure 159/76, pulse 66, respirations 20,  temperature 98.2, oxygen saturation 100 percent on room air.  HEENT is normal.  NECK:  Without jugular venous distention, without lymphadenopathy.  ENDOCRINE:  Without thyromegaly.  CARDIAC:  Normal S1 and S2.  Regular rate and rhythm.  A 1-2/6 systolic  ejection murmur heard best at the right upper sternal border.  LUNGS:  Lungs are clear to auscultation bilaterally.  No rales.  No  wheezes.  SKIN:  Without rash.  ABDOMEN:  Soft, nontender with normoactive bowel sounds.  No  organomegaly.  EXTREMITIES:  Without clubbing, cyanosis  or edema.  MUSCULOSKELETAL:  Without joint deformity.  NEUROLOGIC:  He is alert and oriented x3.  Cranial nerves II-XII grossly  intact.  VASCULAR:  Exam with bilateral carotid bruits noted.  Dorsalis pedis and  posterior tibialis pulses are diminished bilaterally.   LABORATORY DATA:  His head CT with no acute findings.  Chest x-ray with  borderline cardiomegaly, no active lung disease.  EKG with sinus rhythm, with a heart rate of 61, anterolateral ST  elevation, probably representing repolarization abnormality, which has  been described on EKGs in the past.  There has been no significant  change since March 05, 2008.   Hemoglobin 11.9, MCV 85.6.  Potassium 3.7, creatinine 1.11.  Cardiac  markers negative x3.  INR 1.   ASSESSMENT:  1. Left arm numbness in an 77 year old male with a history of coronary      disease, status post remote inferior myocardial infarction with a      nonischemic Myoview in April 2008, prior ejection fraction of 38%,      with recent echocardiogram demonstrating normal LV function with an      EF of 55%.  Cardiac markers have been negative x3.  His EKGs are      unchanged from prior tracings.  2. Cerebrovascular disease with 40% to 59% bilateral internal carotid      artery stenosis.  3. Hypertension.  4. Diabetes mellitus.  5. Hyperlipidemia.  6. Peripheral arterial disease.   RECOMMENDATIONS:  The patient was also interviewed and examined by Dr.  Valera Castle.  His symptoms are somewhat atypical and his EKG changes are  chronic, without significant change.  Cardiac markers were negative x3.  He is ruled out for a myocardial infarction by enzymes.  His symptoms  are probably noncardiac.  We will set him up for an outpatient stress  Myoview study and early followup with Dr. Diona Browner for further  recommendations, if any.  His amlodipine has been adjusted to 10 mg a  day for better blood pressure control.  Otherwise no medication change  will be made.   He will be provided with sublingual nitroglycerin to take  at home, should he  need this.  As noted, no further inpatient cardiac  workup is planned at this time.   Thank you very much for the consultation.      Tereso Newcomer, PA-C      Jesse Sans. Daleen Squibb, MD, [facility]  Electronically Signed    SW/MEDQ  D:  05/15/2008  T:  05/15/2008  Job:  [id number]

## 2010-06-17 NOTE — Assessment & Plan Note (Signed)
[facility]                       [place] CARDIOLOGY OFFICE NOTE   NAME:Oki, DIANNE BADY                       MRN:          [id number]  DATE:05/14/2006                            DOB:          Apr 21, 1924    Mr. Biegel was seen today in follow-up.  He has a history of coronary  artery disease with previous inferior wall MI in 1988.  He also has a  history of peripheral vascular disease, remote tobacco abuse,  hyperlipidemia, diabetes.  He has carotid disease followed by  Di Kindle. Edilia Bo, M.D.  Apparently he had right ICA stenosis of 60-  80% by Duplex in 2005.   In talking to the patient, he has been doing well.  He has not had any  significant chest pain.  His 10-point review of systems is remarkable  for no significant claudication, no chest pain.  He gets some mild  chronic exertional dyspnea and also has some arthritis in his knees and  ankles.   PAST MEDICAL HISTORY:  Remarkable for inferior apical MI in 1988, PVD  with moderate decrease in ABI's apparently by Dopplers in 2005,  hypertension, remote tobacco use, hyperlipidemia, carotid disease.   MEDICATIONS:  1. Glucophage 500 a day.  2. Aspirin a day.  3. Lisinopril 10 a day.  4. Plavix 75 a day.  5. Lopressor 25 b.i.d.   PHYSICAL EXAMINATION:  VITAL SIGNS:  His blood pressure is quite  elevated.  Initially it was 190 systolic.  After I took it at the end of  the interview it was 180/80.  Pulse was 60 and regular.  HEENT:  Normal.  He has a loud left carotid bruit.  LUNGS:  Clear.  HEART:  There is an S1 and S2 with an S4 gallop.  There is a soft  systolic murmur.  ABDOMEN:  Benign.  EXTREMITIES:  Lower extremities with intact pulses, no edema.  His  femoral pulses are palpable without bruits.  His PT's are palpable  bilaterally.  NEUROLOGY:  Nonfocal.  SKIN:  Warm and dry.   His baseline EKG shows LVH with early repolarization.   IMPRESSION:  Jaiden has multiple issues,  the first of which is he needs a  follow-up Myoview.  He has not had a stress test in quite some time and  has a history of a distant MI and is essentially a vasculopathy.   The patient's last LDL cholesterol in October of 2007 was 39 with normal  LFT's and we will continue his Vytorin.   His blood pressures seem suboptimally controlled.  I did notice that  during his last stress test, his systolics reached over 200.  We will  increase his Lisinopril from 10 mg to 40 mg.  In regards to his  vasculopathy, he has carotid bruits, peripheral vascular disease by  history, and coronary disease.  He will be maintained on aspirin and  Plavix.   Angus G. McInnis, M.D. will follow up his hemoglobin A1C.   In regards to his carotids, he needs follow-up for this.  We will  arrange for him to see Di Kindle. Edilia Bo,  M.D. in [place] and  have a follow-up carotid Duplex.   Overall hopefully the patient's Myoview will be nonischemic, although, I  think there is a high likelihood of progressive coronary disease.  He  may need further workup of his heart depending on his carotids, but  again if they have progressed to over 80%, he may need surgical  clearance.   We will see him back in about 3 months to reassess his blood pressure on  the higher dose of Lisinopril.     Noralyn Pick. Eden Emms, MD, [facility]  Electronically Signed    PCN/MedQ  DD: 05/14/2006  DT: 05/14/2006  Job #: [id number]

## 2010-06-17 NOTE — Op Note (Signed)
NAME:  Charles Lester, Charles Lester                          ACCOUNT NO.:  [id number]   MEDICAL RECORD NO.:  [id number]                   PATIENT TYPE:  AMB   LOCATION:  DAY                                  FACILITY:  [facility]   PHYSICIAN:  R. Roetta Sessions, M.D.              DATE OF BIRTH:  [birth year]   DATE OF PROCEDURE:  09/22/2003  DATE OF DISCHARGE:                                 OPERATIVE REPORT   INDICATIONS FOR PROCEDURE:  The patient is a 77 year old gentleman sent over  at the courtesy of Dr. Butch Penny for colorectal cancer screening. He is  devoid of any lower GI tract symptoms. No family history of colorectal  neoplasia. He may have had some type of lower gastrointestinal endoscopy at  the [facility] [place] a couple of decades ago. Colonoscopy is now being done as  a screening maneuver. Procedure has been discussed with patient at length.  Potential risks, benefits, and alternatives have been reviewed and questions  answered. He is agreeable. Please see documentation in the medical record  for more information.   PROCEDURE NOTE:  O2 saturation, blood pressure, pulse, and respirations  monitored throughout the entirety of the procedure. Conscious sedation with  Versed 4 mg IV and Demerol 50 mg IV in divided doses.   INSTRUMENT:  Olympus video chip system.   FINDINGS:  Digital exam revealed no abnormalities.  Prep was adequate.   Rectum:  Examination of the rectal mucosa included retroflexed view of anal  verge revealed no abnormalities.   Colon:  Colonic mucosa was ___________ from the rectosigmoid junction  through the left, transverse, and right colon to the appendiceal orifice,  ileocecal valve, and cecum. These structures were well seen and photographed  for the record. Olympus videoscope was slowly withdrawn, and all previously  mentioned ___________ were again seen. Colonic mucosa appeared normal. The  patient tolerated the procedure well.   IMPRESSION:  1. Normal rectum.  2.  Normal colon.   RECOMMENDATIONS:  If Mr. Sobolewski remains in good health in 10 years, would  consider one more screening colonoscopy at that time.      ___________________________________________                                            Jonathon Bellows, M.D.   RMR/MEDQ  D:  09/22/2003  T:  09/22/2003  Job:  [id number]   cc:   Angus G. Renard Matter, M.D.  [address]  [place]  [place] [address]  Fax: [phone]

## 2010-06-17 NOTE — Assessment & Plan Note (Signed)
[facility]                         [place] CARDIOLOGY OFFICE NOTE   NAME:Charles Lester, Charles Lester                       MRN:          [id number]  DATE:12/06/2005                            DOB:          1924-05-22    Charles Lester is a patient previously seen by Dr. Dorethea Clan.   He has history of hyperlipidemia, hypertension and remote tobacco use.   There is a clinical history of an old IMI, but he has not been catheterized.  He has moderate left carotid disease that is being followed by CV[facility]. He is  not having any significant TIAs or strokes.   REVIEW OF SYSTEMS:  Otherwise remarkable for no significant chest pain,  palpitations, or syncope.   The patient is retired and fairly active.   He has been compliant with his medications.   His primary care M.D. is Dr. Butch Penny.   His medications include:  1. Lopressor 25 b.i.d.  2. Glucophage 500 a day.  3. Aspirin a day.  4. Mavik 2 a day.  5. Vytorin 10/20.  6. Lisinopril 10 a day.  7. Plavix 75 a day.   On exam, his blood pressure is 140/78, pulse is 70 and regular.  SKIN:  Is warm and dry.  HEENT:  Is normal. There is no lymphadenopathy. There is no thyromegaly.  LUNGS:  Clear.  He has a left carotid bruit. There is a S1 and S2 with a soft systolic  murmur.  ABDOMEN:  Benign.  Distal pulses are intact with no edema.   His LDL cholesterol is 58. His LFTs were normal.   IMPRESSION:  Stable. Clinical history of old myocardial infarction with  nonischemic Myoview a year ago. The patient is not having angina. He will  continue his aspirin, Plavix and beta blocker therapy. His LDL is well  treated.   I do not think he needs a followup stress test since he is asymptomatic.   He will continue to follow up the [facility] for his moderate left carotid  disease.   His low LDL and anticoagulation with aspirin and Plavix will help this as  well.   His blood pressure was well controlled with his ARB,  and overall, the  patient has good quality of life. He will follow up with Dr. Renard Matter for  quarterly checks of his hemoglobin A1c and diabetes followup.   He is not smoking. I will see him back in six months.    ______________________________  Noralyn Pick. Eden Emms, MD, [facility]    PCN/MedQ  DD: 12/06/2005  DT: 12/06/2005  Job #: [id number]

## 2011-01-30 ENCOUNTER — Encounter: Payer: Self-pay | Admitting: Cardiology

## 2011-03-07 ENCOUNTER — Encounter: Payer: Self-pay | Admitting: Vascular Surgery

## 2011-03-08 ENCOUNTER — Encounter: Payer: Self-pay | Admitting: Vascular Surgery

## 2011-03-08 ENCOUNTER — Ambulatory Visit (INDEPENDENT_AMBULATORY_CARE_PROVIDER_SITE_OTHER): Payer: Medicare Other | Admitting: Vascular Surgery

## 2011-03-08 ENCOUNTER — Other Ambulatory Visit (INDEPENDENT_AMBULATORY_CARE_PROVIDER_SITE_OTHER): Payer: Medicare Other | Admitting: *Deleted

## 2011-03-08 VITALS — BP 161/63 | HR 61 | Resp 16 | Ht 67.0 in | Wt 178.0 lb

## 2011-03-08 DIAGNOSIS — I6529 Occlusion and stenosis of unspecified carotid artery: Secondary | ICD-10-CM

## 2011-03-08 DIAGNOSIS — I739 Peripheral vascular disease, unspecified: Secondary | ICD-10-CM | POA: Insufficient documentation

## 2011-03-08 NOTE — Progress Notes (Signed)
[facility]ist of [facility]  Patient name: Charles Lester MRN: [id number] DOB: 1924-04-27 Sex: male  REASON FOR VISIT: follow up of carotid disease  HPI: Charles Lester is a 78 y.o. male returns for a routine follow up visit. He has a known left common carotid artery stenosis. Since I saw him last she's had no history of stroke, TIAs, expressive or receptive aphasia, or amaurosis fugax.  The patient does complain of some mild bilateral lower from a claudication. His symptoms are brought on by ambulation and relieved with rest. Involves his calf muscles only. He's had no thigh or hip claudication. His had no rest pain or nonhealing ulcers.  Past Medical History  Diagnosis Date  . Coronary artery disease   . Hyperlipidemia   . Diabetes mellitus   . Arthritis   . Myocardial infarction   . Ischemic cardiomyopathy   . Hypertension     Family History  Problem Relation Age of Onset  . Coronary artery disease Neg Hx     SOCIAL HISTORY: History  Substance Use Topics  . Smoking status: Former Games developer  . Smokeless tobacco: Not on file  . Alcohol Use: No    Not on File  Current Outpatient Prescriptions  Medication Sig Dispense Refill  . amLODipine (NORVASC) 5 MG tablet Take 5 mg by mouth daily.        Marland Kitchen aspirin 325 MG tablet Take 325 mg by mouth daily.        . clopidogrel (PLAVIX) 75 MG tablet Take 75 mg by mouth daily.        Marland Kitchen ezetimibe-simvastatin (VYTORIN) 10-20 MG per tablet Take 1 tablet by mouth at bedtime.        Marland Kitchen lisinopril (PRINIVIL,ZESTRIL) 40 MG tablet Take 40 mg by mouth daily.        . metFORMIN (GLUCOPHAGE) 500 MG tablet Take 500 mg by mouth daily.        . metoprolol (LOPRESSOR) 50 MG tablet Take 25 mg by mouth 2 (two) times daily.          REVIEW OF SYSTEMS: Arly.Keller ] denotes positive finding; [  ] denotes negative finding  CARDIOVASCULAR:  [ ]  chest pain   [ ]  chest pressure   [ ]  palpitations   [ ]  orthopnea   [ ]  dyspnea on exertion   [ ]   claudication   [ ]  rest pain   [ ]  DVT   [ ]  phlebitis PULMONARY:   [ ]  productive cough   [ ]  asthma   [ ]  wheezing NEUROLOGIC:   [ ]  weakness  [ ]  paresthesias  [ ]  aphasia  [ ]  amaurosis  [ ]  dizziness HEMATOLOGIC:   [ ]  bleeding problems   [ ]  clotting disorders MUSCULOSKELETAL:  [ ]  joint pain   [ ]  joint swelling [ ]  leg swelling GASTROINTESTINAL: [ ]   blood in stool  [ ]   hematemesis GENITOURINARY:  [ ]   dysuria  [ ]   hematuria PSYCHIATRIC:  [ ]  history of major depression INTEGUMENTARY:  [ ]  rashes  [ ]  ulcers CONSTITUTIONAL:  [ ]  fever   [ ]  chills  PHYSICAL EXAM: Filed Vitals:   03/08/11 1157 03/08/11 1158  BP: 176/69 161/63  Pulse: 61 61  Resp: 16   Height: 5\' 7"  (1.702 m)   Weight: 178 lb (80.74 kg)   SpO2: 99% 100%   Body mass index is 27.88 kg/(m^2). GENERAL: The patient is a well-nourished male, in no  acute distress. The vital signs are documented above. CARDIOVASCULAR: There is a regular rate and rhythm without significant murmur appreciated. He has bilateral carotid bruits. He has palpable femoral pulses and palpable popliteal pulses bilaterally. I cannot palpate pedal pulses. He has no significant lower extremity swelling. PULMONARY: There is good air exchange bilaterally without wheezing or rales. ABDOMEN: Soft and non-tender with normal pitched bowel sounds. I cannot palpate an abdominal aortic aneurysm. MUSCULOSKELETAL: There are no major deformities or cyanosis. NEUROLOGIC: No focal weakness or paresthesias are detected. SKIN: There are no ulcers or rashes noted. PSYCHIATRIC: The patient has a normal affect.  DATA:  I have independently interpreted his carotid duplex scan. This shows no significant internal carotid artery stenoses on either side. He does have a 60-79% left common carotid artery stenosis.   MEDICAL ISSUES:  CAROTID ARTERY DISEASE The patient has no significant internal carotid artery disease but has a moderate 60-79% left common carotid  artery stenosis. He understands we would not consider left carotid endarterectomy was the stenosis progressed to greater than 80%. He is asymptomatic. I have ordered a fall carotid duplex scan in 6 months. I'll plan on seeing him back in one year and last is any significant change in his duplex in 6 months. Likewise if he develops symptoms I would see him sooner. He knows to continue taking his aspirin.  Peripheral vascular disease Patient has some mild bilateral lower extremity claudication. Based on his exam he has evidence of tibial artery occlusive disease bilaterally. I've encouraged him to stay as active as possible. I've ordered follow up ABIs for 1 year. I'll see him back at that time. He knows to call sooner if he has problems.  Fortunately he  he is not a smoker.    Niccole Witthuhn S [facility] [place] [facility]: [phone]

## 2011-03-08 NOTE — Assessment & Plan Note (Signed)
The patient has no significant internal carotid artery disease but has a moderate 60-79% left common carotid artery stenosis. He understands we would not consider left carotid endarterectomy was the stenosis progressed to greater than 80%. He is asymptomatic. I have ordered a fall carotid duplex scan in 6 months. I'll plan on seeing him back in one year and last is any significant change in his duplex in 6 months. Likewise if he develops symptoms I would see him sooner. He knows to continue taking his aspirin.

## 2011-03-08 NOTE — Assessment & Plan Note (Signed)
Patient has some mild bilateral lower extremity claudication. Based on his exam he has evidence of tibial artery occlusive disease bilaterally. I've encouraged him to stay as active as possible. I've ordered follow up ABIs for 1 year. I'll see him back at that time. He knows to call sooner if he has problems.  Fortunately he  he is not a smoker.

## 2011-03-15 NOTE — Procedures (Unsigned)
CAROTID DUPLEX EXAM  INDICATION:  Follow up carotid disease.  HISTORY: Diabetes:  Yes Cardiac:  Yes. Hypertension:  Yes. Smoking:  No. Previous Surgery:  No. CV History:  Currently asymptomatic. Amaurosis Fugax No, Paresthesias No, Hemiparesis No                                      RIGHT             LEFT Brachial systolic pressure:         162               158 Brachial Doppler waveforms:         Normal            Normal Vertebral direction of flow:        Antegrade         Antegrade/abnormal DUPLEX VELOCITIES (cm/sec) CCA peak systolic                   106               364 ECA peak systolic                   118               188 ICA peak systolic                   127               97 ICA end diastolic                   33                28 PLAQUE MORPHOLOGY:                  Mixed             Mixed PLAQUE AMOUNT:                      Mild              Moderate PLAQUE LOCATION:                    CCA/ECA/ICA       CCA/ECA/ICA  IMPRESSION: 1. Bilateral internal carotid artery velocities suggest 1-39%     stenosis. 2. Left common carotid artery stenosis. 3. Abnormal antegrade left vertebral artery. 4. Stabe in comparison to the previous exam.  ___________________________________________ Di Kindle. Edilia Bo, M.D.  EM/MEDQ  D:  03/09/2011  T:  03/09/2011  Job:  [id number]

## 2011-09-05 ENCOUNTER — Encounter: Payer: Self-pay | Admitting: Neurosurgery

## 2011-09-06 ENCOUNTER — Ambulatory Visit: Payer: Medicare Other | Admitting: Neurosurgery

## 2011-09-06 ENCOUNTER — Other Ambulatory Visit: Payer: Medicare Other

## 2011-10-04 ENCOUNTER — Encounter: Payer: Self-pay | Admitting: Neurosurgery

## 2011-10-05 ENCOUNTER — Ambulatory Visit (INDEPENDENT_AMBULATORY_CARE_PROVIDER_SITE_OTHER): Payer: Medicare Other | Admitting: *Deleted

## 2011-10-05 ENCOUNTER — Ambulatory Visit (INDEPENDENT_AMBULATORY_CARE_PROVIDER_SITE_OTHER): Payer: Medicare Other | Admitting: Neurosurgery

## 2011-10-05 DIAGNOSIS — I6529 Occlusion and stenosis of unspecified carotid artery: Secondary | ICD-10-CM

## 2012-02-09 ENCOUNTER — Ambulatory Visit: Payer: Medicare Other | Admitting: Urology

## 2012-02-23 ENCOUNTER — Other Ambulatory Visit: Payer: Self-pay | Admitting: *Deleted

## 2012-02-23 DIAGNOSIS — I70219 Atherosclerosis of native arteries of extremities with intermittent claudication, unspecified extremity: Secondary | ICD-10-CM

## 2012-02-23 DIAGNOSIS — I6529 Occlusion and stenosis of unspecified carotid artery: Secondary | ICD-10-CM

## 2012-03-12 ENCOUNTER — Encounter: Payer: Self-pay | Admitting: Vascular Surgery

## 2012-03-13 ENCOUNTER — Ambulatory Visit: Payer: Medicare Other | Admitting: Vascular Surgery

## 2012-03-13 ENCOUNTER — Other Ambulatory Visit: Payer: Medicare Other

## 2012-04-09 ENCOUNTER — Encounter: Payer: Self-pay | Admitting: Vascular Surgery

## 2012-04-10 ENCOUNTER — Ambulatory Visit: Payer: Medicare Other | Admitting: Vascular Surgery

## 2012-04-10 ENCOUNTER — Other Ambulatory Visit: Payer: Medicare Other

## 2012-04-30 ENCOUNTER — Encounter: Payer: Self-pay | Admitting: Vascular Surgery

## 2012-05-01 ENCOUNTER — Ambulatory Visit: Payer: Medicare Other | Admitting: Vascular Surgery

## 2012-05-01 ENCOUNTER — Other Ambulatory Visit: Payer: Medicare Other

## 2012-06-18 ENCOUNTER — Encounter: Payer: Self-pay | Admitting: Vascular Surgery

## 2012-06-19 ENCOUNTER — Encounter (INDEPENDENT_AMBULATORY_CARE_PROVIDER_SITE_OTHER): Payer: Medicare Other | Admitting: *Deleted

## 2012-06-19 ENCOUNTER — Other Ambulatory Visit (INDEPENDENT_AMBULATORY_CARE_PROVIDER_SITE_OTHER): Payer: Medicare Other | Admitting: *Deleted

## 2012-06-19 ENCOUNTER — Other Ambulatory Visit: Payer: Self-pay | Admitting: *Deleted

## 2012-06-19 ENCOUNTER — Ambulatory Visit (INDEPENDENT_AMBULATORY_CARE_PROVIDER_SITE_OTHER): Payer: Medicare Other | Admitting: Vascular Surgery

## 2012-06-19 ENCOUNTER — Encounter: Payer: Self-pay | Admitting: Vascular Surgery

## 2012-06-19 DIAGNOSIS — I6529 Occlusion and stenosis of unspecified carotid artery: Secondary | ICD-10-CM

## 2012-06-19 DIAGNOSIS — I70219 Atherosclerosis of native arteries of extremities with intermittent claudication, unspecified extremity: Secondary | ICD-10-CM

## 2012-06-19 DIAGNOSIS — I739 Peripheral vascular disease, unspecified: Secondary | ICD-10-CM

## 2012-06-19 NOTE — Assessment & Plan Note (Signed)
He has stable peripheral vascular disease. He has evidence of superficial femoral artery occlusive disease on the left and tibial artery occlusive disease on the right based on his exam. Fortunately he is not a smoker. I've encouraged him to stay as active as possible. We would not consider further workup unless he developed rest pain or nonhealing ulcer. I'll see him back in one year and I ordered ABIs for that time. He knows to call sooner if he has problems.

## 2012-06-19 NOTE — Progress Notes (Signed)
Vascular and Vein Specialist of [facility]  Patient name: Charles Lester MRN: [id number] DOB: Jun 12, 1924 Sex: male  REASON FOR VISIT: follow up of peripheral vascular disease and carotid disease.  HPI: Charles Lester is a 79 y.o. male I been following with peripheral vascular disease and also a left common carotid artery stenosis. With respect to his peripheral vascular disease, he has stable claudication of both lower extremities. He experiences pain in his calves which is brought on by ambulation and relieved with rest. There are no other aggravating or alleviating factors. He denies any history of rest pain or nonhealing ulcers.  Is respect to his carotid disease, he denies any history of stroke, TIAs, expressive or receptive aphasia, or amaurosis fugax.  Past Medical History  Diagnosis Date  . Coronary artery disease   . Hyperlipidemia   . Diabetes mellitus   . Arthritis   . Myocardial infarction   . Ischemic cardiomyopathy   . Hypertension   . Carotid artery occlusion    Family History  Problem Relation Age of Onset  . Coronary artery disease Neg Hx    SOCIAL HISTORY: History  Substance Use Topics  . Smoking status: Former Games developer  . Smokeless tobacco: Not on file  . Alcohol Use: No   No Known Allergies  Current Outpatient Prescriptions  Medication Sig Dispense Refill  . amLODipine (NORVASC) 5 MG tablet Take 5 mg by mouth daily.        Marland Kitchen aspirin 325 MG tablet Take 325 mg by mouth daily.        . clopidogrel (PLAVIX) 75 MG tablet Take 75 mg by mouth daily.        Marland Kitchen ezetimibe-simvastatin (VYTORIN) 10-20 MG per tablet Take 1 tablet by mouth at bedtime.        . finasteride (PROSCAR) 5 MG tablet Take 1 tablet by mouth daily.      Marland Kitchen lisinopril (PRINIVIL,ZESTRIL) 40 MG tablet Take 40 mg by mouth daily.        . metFORMIN (GLUCOPHAGE-XR) 500 MG 24 hr tablet Take 1 tablet by mouth 2 (two) times daily.      . niacin (NIASPAN) 500 MG CR tablet Take 500 mg by mouth at bedtime.       . tamsulosin (FLOMAX) 0.4 MG CAPS Take 1 capsule by mouth daily.      . metoprolol (LOPRESSOR) 50 MG tablet Take 25 mg by mouth 2 (two) times daily.         No current facility-administered medications for this visit.    REVIEW OF SYSTEMS: Arly.Keller ] denotes positive finding; [  ] denotes negative finding  CARDIOVASCULAR:  [ ]  chest pain   [ ]  chest pressure   [ ]  palpitations   [ ]  orthopnea   Arly.Keller ] dyspnea on exertion   [ ]  claudication   [ ]  rest pain   [ ]  DVT   [ ]  phlebitis PULMONARY:   [ ]  productive cough   [ ]  asthma   [ ]  wheezing NEUROLOGIC:   [ ]  weakness  [ ]  paresthesias  [ ]  aphasia  [ ]  amaurosis  [ ]  dizziness HEMATOLOGIC:   [ ]  bleeding problems   [ ]  clotting disorders MUSCULOSKELETAL:  [ ]  joint pain   [ ]  joint swelling [ ]  leg swelling GASTROINTESTINAL: [ ]   blood in stool  [ ]   hematemesis GENITOURINARY:  [ ]   dysuria  [ ]   hematuria PSYCHIATRIC:  [ ]  history of major  depression INTEGUMENTARY:  [ ]  rashes  [ ]  ulcers CONSTITUTIONAL:  [ ]  fever   [ ]  chills  PHYSICAL EXAM: Filed Vitals:   06/19/12 1100 06/19/12 1101  BP: 172/56 161/53  Pulse: 58   Height: 5\' 7"  (1.702 m)   Weight: 177 lb 9.6 oz (80.559 kg)   SpO2: 100%    Body mass index is 27.81 kg/(m^2). GENERAL: The patient is a well-nourished male, in no acute distress. The vital signs are documented above. CARDIOVASCULAR: There is a regular rate and rhythm. He has bilateral carotid bruits. He has palpable femoral pulses. He has a palpable right popliteal pulse. I cannot palpate a left popliteal pulse. I cannot palpate pedal pulses. PULMONARY: There is good air exchange bilaterally without wheezing or rales. ABDOMEN: Soft and non-tender with normal pitched bowel sounds.  MUSCULOSKELETAL: There are no major deformities or cyanosis. NEUROLOGIC: No focal weakness or paresthesias are detected. SKIN: There are no ulcers or rashes noted. PSYCHIATRIC: The patient has a normal affect.  DATA:  Have independently  interpreted his arterial Doppler study which shows an ankle-brachial index of 62% on the right with a toe brachial index of 60%. He has monophasic Doppler signals in the right posterior tibial position a biphasic dorsalis pedis signal. On the left side he has an ankle-brachial index of 68% with a digital brachial index of 33%. He has monophasic Doppler signals on the left.  I have independently interpreted his carotid duplex scan. He has no significant internal carotid artery disease. He does however have a 60-79% left common carotid artery stenosis in the midsegment of the common carotid artery.  MEDICAL ISSUES:  CAROTID ARTERY DISEASE He has a 60-79% stenosis in the midportion of the left common carotid artery. This is asymptomatic. He has no significant stenosis on the right side. He understands we would not consider addressing this stenosis unless it progressed to greater than 80%. I have ordered a follow carotid duplex scan in 6 months. I'll plan on seeing him back in one year and last is any change in his duplex in 6 months. He does know to continue taking his aspirin.  Peripheral vascular disease He has stable peripheral vascular disease. He has evidence of superficial femoral artery occlusive disease on the left and tibial artery occlusive disease on the right based on his exam. Fortunately he is not a smoker. I've encouraged him to stay as active as possible. We would not consider further workup unless he developed rest pain or nonhealing ulcer. I'll see him back in one year and I ordered ABIs for that time. He knows to call sooner if he has problems.   DICKSON,CHRISTOPHER S [facility] [place] [facility]: [phone]

## 2012-06-19 NOTE — Assessment & Plan Note (Signed)
He has a 60-79% stenosis in the midportion of the left common carotid artery. This is asymptomatic. He has no significant stenosis on the right side. He understands we would not consider addressing this stenosis unless it progressed to greater than 80%. I have ordered a follow carotid duplex scan in 6 months. I'll plan on seeing him back in one year and last is any change in his duplex in 6 months. He does know to continue taking his aspirin.

## 2012-12-17 ENCOUNTER — Encounter: Payer: Self-pay | Admitting: Vascular Surgery

## 2012-12-18 ENCOUNTER — Other Ambulatory Visit (HOSPITAL_COMMUNITY): Payer: Medicare Other

## 2012-12-18 ENCOUNTER — Ambulatory Visit: Payer: Medicare Other | Admitting: Vascular Surgery

## 2012-12-25 ENCOUNTER — Ambulatory Visit: Payer: Medicare Other | Admitting: Vascular Surgery

## 2012-12-25 ENCOUNTER — Other Ambulatory Visit: Payer: Medicare Other

## 2013-03-18 ENCOUNTER — Other Ambulatory Visit: Payer: Self-pay | Admitting: Vascular Surgery

## 2013-03-18 DIAGNOSIS — I739 Peripheral vascular disease, unspecified: Secondary | ICD-10-CM

## 2013-06-24 ENCOUNTER — Encounter: Payer: Self-pay | Admitting: Family

## 2013-06-25 ENCOUNTER — Ambulatory Visit: Payer: Medicare Other | Admitting: Family

## 2013-06-25 ENCOUNTER — Inpatient Hospital Stay (HOSPITAL_COMMUNITY): Admission: RE | Admit: 2013-06-25 | Payer: Medicare Other | Source: Ambulatory Visit

## 2014-03-10 ENCOUNTER — Other Ambulatory Visit: Payer: Self-pay | Admitting: *Deleted

## 2014-03-10 DIAGNOSIS — I6523 Occlusion and stenosis of bilateral carotid arteries: Secondary | ICD-10-CM

## 2014-03-11 ENCOUNTER — Ambulatory Visit (INDEPENDENT_AMBULATORY_CARE_PROVIDER_SITE_OTHER)
Admission: RE | Admit: 2014-03-11 | Discharge: 2014-03-11 | Disposition: A | Discharge status: Home / Self Care | Payer: Medicare Other | Source: Ambulatory Visit | Attending: Vascular Surgery | Admitting: Vascular Surgery

## 2014-03-11 ENCOUNTER — Ambulatory Visit: Payer: Medicare Other | Admitting: Vascular Surgery

## 2014-03-11 ENCOUNTER — Ambulatory Visit (HOSPITAL_COMMUNITY)
Admission: RE | Admit: 2014-03-11 | Discharge: 2014-03-11 | Disposition: A | Discharge status: Home / Self Care | Payer: Medicare Other | Source: Ambulatory Visit | Attending: Vascular Surgery | Admitting: Vascular Surgery

## 2014-03-11 DIAGNOSIS — I6523 Occlusion and stenosis of bilateral carotid arteries: Secondary | ICD-10-CM

## 2014-03-11 DIAGNOSIS — I739 Peripheral vascular disease, unspecified: Secondary | ICD-10-CM

## 2014-03-31 ENCOUNTER — Encounter: Payer: Self-pay | Admitting: Vascular Surgery

## 2014-04-01 ENCOUNTER — Encounter: Payer: Self-pay | Admitting: Vascular Surgery

## 2014-04-01 ENCOUNTER — Ambulatory Visit (INDEPENDENT_AMBULATORY_CARE_PROVIDER_SITE_OTHER): Payer: Medicare Other | Admitting: Vascular Surgery

## 2014-04-01 VITALS — BP 163/60 | HR 72 | Ht 67.0 in | Wt 175.0 lb

## 2014-04-01 DIAGNOSIS — I70209 Unspecified atherosclerosis of native arteries of extremities, unspecified extremity: Secondary | ICD-10-CM

## 2014-04-01 DIAGNOSIS — I6522 Occlusion and stenosis of left carotid artery: Secondary | ICD-10-CM

## 2014-04-01 NOTE — Addendum Note (Signed)
Addended by: Dorthula Rue L on: 04/01/2014 11:27 AM   Modules accepted: Orders

## 2014-04-01 NOTE — Progress Notes (Signed)
Vascular and Vein Specialist of [facility]  Patient name: Charles Lester MRN: [id number] DOB: [birth year] Sex: male  REASON FOR VISIT: Follow up of peripheral vascular disease and carotid disease.  HPI: Charles Lester is a 81 y.o. male who I last saw on 06/19/2012. He has a history of claudication and also a left carotid stenosis. At the time of his last visit, his left carotid stenosis was 60-79%. There was no significant stenosis on the right. He was asymptomatic. His peripheral vascular disease was stable. ABI on the left was 68%. ABI on the right was 62%.  Since I saw him last, he describes stiffness in his thighs but I do not get any clear-cut history of calf or thigh claudication. I believe that his activity is fairly limited. He denies any history of rest pain or nonhealing ulcers. He denies any history of stroke, TIAs, expressive or receptive aphasia, or amaurosis fugax.  Past Medical History  Diagnosis Date  . Coronary artery disease   . Hyperlipidemia   . Diabetes mellitus   . Arthritis   . Myocardial infarction   . Ischemic cardiomyopathy   . Hypertension   . Carotid artery occlusion    Family History  Problem Relation Age of Onset  . Coronary artery disease Neg Hx    SOCIAL HISTORY: History  Substance Use Topics  . Smoking status: Former Research scientist (life sciences)  . Smokeless tobacco: Not on file  . Alcohol Use: No   No Known Allergies Current Outpatient Prescriptions  Medication Sig Dispense Refill  . amLODipine (NORVASC) 5 MG tablet Take 5 mg by mouth daily.      Marland Kitchen aspirin 325 MG tablet Take 325 mg by mouth daily.      . clopidogrel (PLAVIX) 75 MG tablet Take 75 mg by mouth daily.      Marland Kitchen ezetimibe-simvastatin (VYTORIN) 10-20 MG per tablet Take 1 tablet by mouth at bedtime.      . finasteride (PROSCAR) 5 MG tablet Take 1 tablet by mouth daily.    Marland Kitchen lisinopril (PRINIVIL,ZESTRIL) 40 MG tablet Take 40 mg by mouth daily.      . metFORMIN (GLUCOPHAGE-XR) 500 MG 24 hr tablet Take 1  tablet by mouth 2 (two) times daily.    . metoprolol (LOPRESSOR) 50 MG tablet Take 25 mg by mouth 2 (two) times daily.      . niacin (NIASPAN) 500 MG CR tablet Take 500 mg by mouth at bedtime.    . tamsulosin (FLOMAX) 0.4 MG CAPS Take 1 capsule by mouth daily.     No current facility-administered medications for this visit.   REVIEW OF SYSTEMS: Valu.Nieves ] denotes positive finding; [  ] denotes negative finding  CARDIOVASCULAR:  [ ]  chest pain   [ ]  chest pressure   [ ]  palpitations   [ ]  orthopnea   [ ]  dyspnea on exertion   [ ]  claudication   [ ]  rest pain   [ ]  DVT   [ ]  phlebitis PULMONARY:   [ ]  productive cough   [ ]  asthma   [ ]  wheezing NEUROLOGIC:   [ ]  weakness  [ ]  paresthesias  [ ]  aphasia  [ ]  amaurosis  [ ]  dizziness HEMATOLOGIC:   [ ]  bleeding problems   [ ]  clotting disorders MUSCULOSKELETAL:  [ ]  joint pain   [ ]  joint swelling [ ]  leg swelling GASTROINTESTINAL: [ ]   blood in stool  [ ]   hematemesis GENITOURINARY:  [ ]   dysuria  [ ]   hematuria PSYCHIATRIC:  [ ]  history of major depression INTEGUMENTARY:  [ ]  rashes  [ ]  ulcers CONSTITUTIONAL:  [ ]  fever   [ ]  chills  PHYSICAL EXAM: Filed Vitals:   04/01/14 0840 04/01/14 0842  BP: 176/60 163/60  Pulse: 72   Height: 5\' 7"  (1.702 m)   Weight: 175 lb (79.379 kg)   SpO2: 100%    Body mass index is 27.4 kg/(m^2). GENERAL: The patient is a well-nourished male, in no acute distress. The vital signs are documented above. CARDIOVASCULAR: There is a regular rate and rhythm. He has bilateral carotid bruits. He has palpable femoral pulses. I cannot palpate pedal pulses. PULMONARY: There is good air exchange bilaterally without wheezing or rales. ABDOMEN: Soft and non-tender with normal pitched bowel sounds. I cannot palpate an abdominal aortic aneurysm. MUSCULOSKELETAL: There are no major deformities or cyanosis. NEUROLOGIC: No focal weakness or paresthesias are detected. SKIN: There are no ulcers or rashes noted. PSYCHIATRIC: The  patient has a normal affect.  DATA:  I have independently interpreted his carotid Doppler study from 03/11/2014. On the left side, which is the site of concern, he has a 40-59% left internal carotid artery stenosis but has a 60-79% stenosis in the common carotid artery below the bifurcation. This is the stenosis that I have been following. On the right side he has no significant disease.  I have independently interpreted his arterial Doppler study. He has monophasic Doppler signals in both feet with an ABI of 57% on the right and 72% on the left. These ABIs are stable.  MEDICAL ISSUES: CAROTID DISEASE: He has a 60-79% stenosis in the midportion of the left common carotid artery with an approximate 50% left internal carotid artery stenosis. There is no significant stenosis on the right. He is asymptomatic. Given that the stenosis is in the common carotid artery and is asymptomatic and clearly less than 80% I have recommended a follow up study in 9 months. Given his age I would be reluctant to consider surgery unless this progressed significantly or he became symptomatic. He is on aspirin and is on a statin. I have encouraged him to stay as active as possible.  PERIPHERAL VASCULAR DISEASE: He has stable peripheral vascular disease. I have not ordered follow up ABIs in 9 months as his symptoms have been stable. I have encouraged in stay as active as possible. I explained that I think his stiffness is more likely from arthritis and not his peripheral vascular disease.  He is scheduled to see our nurse practitioner in 9 months.   Return in about 9 months (around 01/01/2015).   [place] [facility] [place] Beeper: [phone]

## 2015-01-06 ENCOUNTER — Ambulatory Visit: Payer: Medicare Other | Admitting: Family

## 2015-01-06 ENCOUNTER — Encounter (HOSPITAL_COMMUNITY): Payer: Medicare Other

## 2015-03-09 ENCOUNTER — Other Ambulatory Visit (HOSPITAL_COMMUNITY): Payer: Self-pay | Admitting: Internal Medicine

## 2015-03-09 DIAGNOSIS — R0989 Other specified symptoms and signs involving the circulatory and respiratory systems: Secondary | ICD-10-CM

## 2015-03-12 ENCOUNTER — Ambulatory Visit (HOSPITAL_COMMUNITY)
Admission: RE | Admit: 2015-03-12 | Discharge: 2015-03-12 | Disposition: A | Discharge status: Home / Self Care | Payer: Medicare HMO | Source: Ambulatory Visit | Attending: Internal Medicine | Admitting: Internal Medicine

## 2015-03-12 DIAGNOSIS — R0989 Other specified symptoms and signs involving the circulatory and respiratory systems: Secondary | ICD-10-CM | POA: Diagnosis present

## 2015-03-12 DIAGNOSIS — I6523 Occlusion and stenosis of bilateral carotid arteries: Secondary | ICD-10-CM | POA: Diagnosis not present

## 2015-08-26 ENCOUNTER — Ambulatory Visit: Payer: Medicare HMO | Admitting: Cardiology

## 2015-10-12 ENCOUNTER — Encounter: Payer: Self-pay | Admitting: Cardiology

## 2015-10-12 ENCOUNTER — Ambulatory Visit (INDEPENDENT_AMBULATORY_CARE_PROVIDER_SITE_OTHER): Payer: Medicare HMO | Admitting: Cardiology

## 2015-10-12 VITALS — BP 140/64 | HR 70 | Ht 67.5 in | Wt 165.0 lb

## 2015-10-12 DIAGNOSIS — E785 Hyperlipidemia, unspecified: Secondary | ICD-10-CM | POA: Diagnosis not present

## 2015-10-12 DIAGNOSIS — R011 Cardiac murmur, unspecified: Secondary | ICD-10-CM

## 2015-10-12 DIAGNOSIS — I6523 Occlusion and stenosis of bilateral carotid arteries: Secondary | ICD-10-CM

## 2015-10-12 DIAGNOSIS — I10 Essential (primary) hypertension: Secondary | ICD-10-CM

## 2015-10-12 DIAGNOSIS — I2510 Atherosclerotic heart disease of native coronary artery without angina pectoris: Secondary | ICD-10-CM | POA: Diagnosis not present

## 2015-10-12 NOTE — Patient Instructions (Signed)
Medication Instructions:  DECREASE ASPIRIN TO 81 MG DAILY   Labwork: NONE  Testing/Procedures: Your physician has requested that you have an echocardiogram. Echocardiography is a painless test that uses sound waves to create images of your heart. It provides your doctor with information about the size and shape of your heart and how well your heart's chambers and valves are working. This procedure takes approximately one hour. There are no restrictions for this procedure.    Follow-Up: Your physician recommends that you schedule a follow-up appointment in: 3 MONTHS    Any Other Special Instructions Will Be Listed Below (If Applicable).  WE HAVE PUT IN A REFERRAL TO DR. DICKSON, SOMEONE FROM THEIR OFFICE WILL CONTACT YOU SOON.    If you need a refill on your cardiac medications before your next appointment, please call your pharmacy.

## 2015-10-12 NOTE — Progress Notes (Signed)
Clinical Summary Mr. Jin is a 82 y.o.male seen as a new patient, he is referred by Dr Maudie Mercury.   1. Heart murmur - recently detected by pcp - he denies any chest pain, SOB/DOE, or syncope.   2. Carotid stenosis - followed by vascular  3. HTN - does not check regularly at home - compliant with meds  4. Hyperlipidemia - he is on zetia/simva and niacin.  - 07/2015 TC 127 TG 47 HDL 59 LDL 59   5. CAD - presumed based on prior nuclear imaging. 04/2008 nuke imaging with moderate sized inferolateral infarction w/o active ischemia.  - no recent chest pain. No SOB or DOE. Limited due to chronic leg pains - he has been on plavix for 10 years for unclear reasons, he thinks started by Dr Scotty Court.    Past Medical History:  Diagnosis Date  . Arthritis   . Carotid artery occlusion   . Coronary artery disease   . Diabetes mellitus   . Hyperlipidemia   . Hypertension   . Ischemic cardiomyopathy   . Myocardial infarction ([place])      No Known Allergies   Current Outpatient Prescriptions  Medication Sig Dispense Refill  . amLODipine (NORVASC) 5 MG tablet Take 5 mg by mouth daily.      Marland Kitchen aspirin 325 MG tablet Take 325 mg by mouth daily.      . clopidogrel (PLAVIX) 75 MG tablet Take 75 mg by mouth daily.      Marland Kitchen ezetimibe-simvastatin (VYTORIN) 10-20 MG per tablet Take 1 tablet by mouth at bedtime.      . finasteride (PROSCAR) 5 MG tablet Take 1 tablet by mouth daily.    Marland Kitchen lisinopril (PRINIVIL,ZESTRIL) 40 MG tablet Take 40 mg by mouth daily.      . metFORMIN (GLUCOPHAGE-XR) 500 MG 24 hr tablet Take 1 tablet by mouth 2 (two) times daily.    . metoprolol (LOPRESSOR) 50 MG tablet Take 25 mg by mouth 2 (two) times daily.      . niacin (NIASPAN) 500 MG CR tablet Take 500 mg by mouth at bedtime.    . tamsulosin (FLOMAX) 0.4 MG CAPS Take 1 capsule by mouth daily.     No current facility-administered medications for this visit.      No past surgical history on file.   No Known  Allergies    Family History  Problem Relation Age of Onset  . Coronary artery disease Neg Hx      Social History Mr. Bielec reports that he has quit smoking. He does not have any smokeless tobacco history on file. Mr. Kleinke reports that he does not drink alcohol.   Review of Systems CONSTITUTIONAL: No weight loss, fever, chills, weakness or fatigue.  HEENT: Eyes: No visual loss, blurred vision, double vision or yellow sclerae.No hearing loss, sneezing, congestion, runny nose or sore throat.  SKIN: No rash or itching.  CARDIOVASCULAR: per HPI RESPIRATORY: No shortness of breath, cough or sputum.  GASTROINTESTINAL: No anorexia, nausea, vomiting or diarrhea. No abdominal pain or blood.  GENITOURINARY: No burning on urination, no polyuria NEUROLOGICAL: No headache, dizziness, syncope, paralysis, ataxia, numbness or tingling in the extremities. No change in bowel or bladder control.  MUSCULOSKELETAL: No muscle, back pain, joint pain or stiffness.  LYMPHATICS: No enlarged nodes. No history of splenectomy.  PSYCHIATRIC: No history of depression or anxiety.  ENDOCRINOLOGIC: No reports of sweating, cold or heat intolerance. No polyuria or polydipsia.  Marland Kitchen   Physical Examination  Vitals:   10/12/15 1320  BP: 140/64  Pulse: 70   Vitals:   10/12/15 1320  Weight: 165 lb (74.8 kg)  Height: 5' 7.5" (1.715 m)    Gen: resting comfortably, no acute distress HEENT: no scleral icterus, pupils equal round and reactive, no palptable cervical adenopathy,  CV: RRR, 3/6 systolic murmur apex,  no jvd Resp: Clear to auscultation bilaterally GI: abdomen is soft, non-tender, non-distended, normal bowel sounds, no hepatosplenomegaly MSK: extremities are warm, no edema.  Skin: warm, no rash Neuro:  no focal deficits Psych: appropriate affect     Assessment and Plan  1. Heart murmur - obtain echo  2. HTN  - at goal, continue current meds  3. Hyperlipidemia - recommend stopping  niacin and vytorin, starting high dose statin with atorva 80mg  daily  4. CAD - no recent symptoms, continue current meds     Arnoldo Lenis, MD

## 2015-10-13 ENCOUNTER — Ambulatory Visit (HOSPITAL_COMMUNITY)
Admission: RE | Admit: 2015-10-13 | Discharge: 2015-10-13 | Disposition: A | Discharge status: Home / Self Care | Payer: Medicare HMO | Source: Ambulatory Visit | Attending: Cardiology | Admitting: Cardiology

## 2015-10-13 DIAGNOSIS — I340 Nonrheumatic mitral (valve) insufficiency: Secondary | ICD-10-CM | POA: Insufficient documentation

## 2015-10-13 DIAGNOSIS — I071 Rheumatic tricuspid insufficiency: Secondary | ICD-10-CM | POA: Insufficient documentation

## 2015-10-13 DIAGNOSIS — I2510 Atherosclerotic heart disease of native coronary artery without angina pectoris: Secondary | ICD-10-CM | POA: Diagnosis not present

## 2015-10-13 DIAGNOSIS — R011 Cardiac murmur, unspecified: Secondary | ICD-10-CM | POA: Diagnosis not present

## 2015-10-13 DIAGNOSIS — Z87891 Personal history of nicotine dependence: Secondary | ICD-10-CM | POA: Diagnosis not present

## 2015-10-13 DIAGNOSIS — E785 Hyperlipidemia, unspecified: Secondary | ICD-10-CM | POA: Insufficient documentation

## 2015-10-13 DIAGNOSIS — I352 Nonrheumatic aortic (valve) stenosis with insufficiency: Secondary | ICD-10-CM | POA: Diagnosis not present

## 2015-10-13 DIAGNOSIS — I517 Cardiomegaly: Secondary | ICD-10-CM | POA: Insufficient documentation

## 2015-10-13 LAB — ECHOCARDIOGRAM COMPLETE
AOPV: 0.47 m/s
AOVTI: 51 cm
AV Area mean vel: 1.51 cm2
AV VEL mean LVOT/AV: 0.48
AV area mean vel ind: 0.81 cm2/m2
AVA: 1.5 cm2
AVAREAVTI: 1.47 cm2
AVAREAVTIIND: 0.8 cm2/m2
AVG: 9 mmHg
AVLVOTPG: 4 mmHg
AVPG: 20 mmHg
AVPHT: 661 ms
AVPKVEL: 224 cm/s
Area-P 1/2: 2.39 cm2
CHL CUP AV PEAK INDEX: 0.79
CHL CUP AV VEL: 1.5
CHL CUP DOP CALC LVOT VTI: 24.3 cm
CHL CUP LVOT MV VTI INDEX: 0.92 cm2/m2
CHL CUP MV M VEL: 76.1
DOP CAL AO MEAN VELOCITY: 141 cm/s
E decel time: 194 msec
E/e' ratio: 13.85
FS: 15 % — AB (ref 28–44)
IVS/LV PW RATIO, ED: 1.18
LA ID, A-P, ES: 39 mm
LA diam end sys: 39 mm
LA diam index: 2.09 cm/m2
LA vol A4C: 63 ml
LA vol: 63.8 mL
LAVOLIN: 34.1 mL/m2
LV E/e' medial: 13.85
LV TDI E'LATERAL: 6.2
LV e' LATERAL: 6.2 cm/s
LVEEAVG: 13.85
LVOT MV VTI: 1.72
LVOT area: 3.14 cm2
LVOT peak vel: 105 cm/s
LVOTD: 20 mm
LVOTSV: 76 mL
LVOTVTI: 0.48 cm
MV Dec: 194
MV Peak grad: 3 mmHg
MV pk E vel: 85.9 m/s
MVANNULUSVTI: 44.4 cm
MVG: 3 mmHg
MVPKAVEL: 115 m/s
P 1/2 time: 92 ms
PW: 11.8 mm — AB (ref 0.6–1.1)
RV TAPSE: 26.5 mm
TDI e' medial: 5.98
Valve area index: 0.8

## 2015-10-13 NOTE — Progress Notes (Signed)
*  PRELIMINARY RESULTS* Echocardiogram 2D Echocardiogram has been performed.  [name] 10/13/2015, 10:27 AM

## 2015-10-20 ENCOUNTER — Other Ambulatory Visit: Payer: Self-pay | Admitting: *Deleted

## 2015-10-20 DIAGNOSIS — I6523 Occlusion and stenosis of bilateral carotid arteries: Secondary | ICD-10-CM

## 2015-11-11 ENCOUNTER — Encounter: Payer: Self-pay | Admitting: Vascular Surgery

## 2015-11-15 ENCOUNTER — Other Ambulatory Visit: Payer: Self-pay | Admitting: *Deleted

## 2015-11-15 DIAGNOSIS — I6523 Occlusion and stenosis of bilateral carotid arteries: Secondary | ICD-10-CM

## 2015-11-15 DIAGNOSIS — I739 Peripheral vascular disease, unspecified: Secondary | ICD-10-CM

## 2015-11-17 ENCOUNTER — Ambulatory Visit (INDEPENDENT_AMBULATORY_CARE_PROVIDER_SITE_OTHER): Payer: Medicare HMO | Admitting: Vascular Surgery

## 2015-11-17 ENCOUNTER — Ambulatory Visit (HOSPITAL_COMMUNITY)
Admission: RE | Admit: 2015-11-17 | Discharge: 2015-11-17 | Disposition: A | Discharge status: Home / Self Care | Payer: Medicare HMO | Source: Ambulatory Visit | Attending: Vascular Surgery | Admitting: Vascular Surgery

## 2015-11-17 ENCOUNTER — Encounter: Payer: Self-pay | Admitting: Vascular Surgery

## 2015-11-17 ENCOUNTER — Ambulatory Visit (INDEPENDENT_AMBULATORY_CARE_PROVIDER_SITE_OTHER)
Admission: RE | Admit: 2015-11-17 | Discharge: 2015-11-17 | Disposition: A | Discharge status: Home / Self Care | Payer: Medicare HMO | Source: Ambulatory Visit | Attending: Vascular Surgery | Admitting: Vascular Surgery

## 2015-11-17 VITALS — BP 155/61 | HR 62 | Temp 97.6°F | Resp 16 | Ht 67.5 in | Wt 163.0 lb

## 2015-11-17 DIAGNOSIS — I6522 Occlusion and stenosis of left carotid artery: Secondary | ICD-10-CM | POA: Diagnosis not present

## 2015-11-17 DIAGNOSIS — I6523 Occlusion and stenosis of bilateral carotid arteries: Secondary | ICD-10-CM | POA: Diagnosis not present

## 2015-11-17 DIAGNOSIS — I739 Peripheral vascular disease, unspecified: Secondary | ICD-10-CM

## 2015-11-17 LAB — VAS US CAROTID
LCCAPDIAS: 9 cm/s
LEFT ECA DIAS: 0 cm/s
LEFT VERTEBRAL DIAS: 141 cm/s
LICAPDIAS: 14 cm/s
Left CCA dist dias: -35 cm/s
Left CCA dist sys: -152 cm/s
Left CCA prox sys: 37 cm/s
Left ICA dist dias: 19 cm/s
Left ICA dist sys: 46 cm/s
Left ICA prox sys: 70 cm/s
RCCADSYS: -72 cm/s
RCCAPDIAS: -18 cm/s
RCCAPSYS: -141 cm/s
RIGHT CCA MID DIAS: 15 cm/s
RIGHT ECA DIAS: 0 cm/s

## 2015-11-17 NOTE — Progress Notes (Signed)
Patient name: Charles Lester MRN: [id number] DOB: 23-Mar-1924 Sex: male  REASON FOR VISIT: Follow up of carotid disease and peripheral vascular disease.  HPI: Charles Lester is a 82 y.o. male who I last saw in March of 2016. He has peripheral vascular disease and carotid disease.  With respect to his carotid disease, at the time of his last visit he had a 40-59% left carotid stenosis but a 60-79% stenosis at the common carotid artery. There was no significant disease on the right. Since I saw him last, he denies any history of stroke, TIAs, expressive or receptive aphasia, or amaurosis fugax. He is on aspirin, Plavix, and a statin.  With respect to his peripheral vascular disease he had an ABI 57% on the right and 72% on the left. He has stable bilateral lower extremity claudication. He denies any rest pain or history of nonhealing ulcers. He is not a smoker.  The patient did have an echo on 10/13/2015. This showed that his systolic function was normal with an ejection fraction of 55-60%. He had mild to moderate aortic stenosis.  Past Medical History:  Diagnosis Date  . Arthritis   . Carotid artery occlusion   . Coronary artery disease   . Diabetes mellitus   . Hyperlipidemia   . Hypertension   . Ischemic cardiomyopathy   . Myocardial infarction     Family History  Problem Relation Age of Onset  . Coronary artery disease Neg Hx     SOCIAL HISTORY: Social History  Substance Use Topics  . Smoking status: Former Smoker    Quit date: 02/10/2005  . Smokeless tobacco: Never Used  . Alcohol use No    No Known Allergies  Current Outpatient Prescriptions  Medication Sig Dispense Refill  . amLODipine (NORVASC) 5 MG tablet Take 5 mg by mouth daily.      Marland Kitchen aspirin EC 81 MG tablet Take 81 mg by mouth daily.    . clopidogrel (PLAVIX) 75 MG tablet Take 75 mg by mouth daily.      . cyanocobalamin 2000 MCG tablet Take 2,000 mcg by mouth daily.    Marland Kitchen ezetimibe-simvastatin (VYTORIN) 10-20  MG per tablet Take 1 tablet by mouth at bedtime.      . finasteride (PROSCAR) 5 MG tablet Take 1 tablet by mouth daily.    Marland Kitchen lisinopril (PRINIVIL,ZESTRIL) 40 MG tablet Take 40 mg by mouth daily.      . metFORMIN (GLUCOPHAGE-XR) 500 MG 24 hr tablet Take 1 tablet by mouth 2 (two) times daily.    . metoprolol (LOPRESSOR) 50 MG tablet Take 25 mg by mouth 2 (two) times daily.      . niacin (NIASPAN) 500 MG CR tablet Take 500 mg by mouth at bedtime.    . tamsulosin (FLOMAX) 0.4 MG CAPS Take 1 capsule by mouth daily.     No current facility-administered medications for this visit.     REVIEW OF SYSTEMS:  [X]  denotes positive finding, [ ]  denotes negative finding Cardiac  Comments:  Chest pain or chest pressure:    Shortness of breath upon exertion:    Short of breath when lying flat:    Irregular heart rhythm:        Vascular    Pain in calf, thigh, or hip brought on by ambulation: X   Pain in feet at night that wakes you up from your sleep:     Blood clot in your veins:    Leg swelling:  Pulmonary    Oxygen at home:    Productive cough:     Wheezing:         Neurologic    Sudden weakness in arms or legs:     Sudden numbness in arms or legs:     Sudden onset of difficulty speaking or slurred speech:    Temporary loss of vision in one eye:     Problems with dizziness:         Gastrointestinal    Blood in stool:     Vomited blood:         Genitourinary    Burning when urinating:     Blood in urine:        Psychiatric    Major depression:         Hematologic    Bleeding problems:    Problems with blood clotting too easily:        Skin    Rashes or ulcers:        Constitutional    Fever or chills:      PHYSICAL EXAM: Vitals:   11/17/15 1519 11/17/15 1520  BP: (!) 158/63 (!) 155/61  Pulse: 62   Resp: 16   Temp: 97.6 F (36.4 C)   TempSrc: Oral   SpO2: 98%   Weight: 163 lb (73.9 kg)   Height: 5' 7.5" (1.715 m)     GENERAL: The patient is a  well-nourished male, in no acute distress. The vital signs are documented above. CARDIAC: There is a regular rate and rhythm.  VASCULAR: He has bilateral carotid bruits. On the right side, he has a palpable femoral and popliteal pulse. I cannot palpate pedal pulses. On the left side he has a palpable femoral pulse. I cannot palpate a popliteal or pedal pulses. PULMONARY: There is good air exchange bilaterally without wheezing or rales. ABDOMEN: Soft and non-tender with normal pitched bowel sounds.  MUSCULOSKELETAL: There are no major deformities or cyanosis. NEUROLOGIC: No focal weakness or paresthesias are detected. SKIN: There are no ulcers or rashes noted. PSYCHIATRIC: The patient has a normal affect.  DATA:   LOWER EXTREMITY ARTERIAL DOPPLER STUDY: I have independently interpreted his lower extremity arterial Doppler study.  On the right side, there is a monophasic dorsalis pedis and posterior tibial signal. ABI on the right is 68%. Temperature on the right is 68 mmHg.  On the left side, there is a monophasic dorsalis pedis and posterior tibial signal. A BIOM the left is 76%. Toe pressure on the left is 67 mmHg.  CAROTID DUPLEX: I have independently interpreted his carotid duplex scan. On the left side, there is evidence of a stenosis in the mid common carotid artery of greater than 50%. These velocities have increased significantly in the left common carotid artery. There is no significant internal carotid artery stenosis. On the right side there is no significant carotid stenosis.  MEDICAL ISSUES:  LEFT COMMON CAROTID ARTERY STENOSIS: This patient does not have any significant internal carotid artery disease but does have a significant left common carotid artery stenosis which has progressed compared to his previous study. He is asymptomatic. However, I have recommended a CT angiogram to further evaluate the stenosis. CT angiogram of the neck would also help [place] determine if he was a  candidate for stent if it was felt that this should be addressed. However, currently the patient does not wish to consider endarterectomy or carotid stenting and therefore we will hold off on the CT angiogram  of the neck. I have explained that there is risk of stroke given the carotid stenosis. However, he also understands that there is a small risk of stroke with carotid stenting or carotid endarterectomy. Given that he is 82 years old and asymptomatic, I do not think his choice to hold off on considering surgery for the left carotid stenosis is unreasonable. He is agreeable to schedule a carotid duplex in 6 months and I'll see him back at that time. He is not a smoker. He knows to continue his aspirin, Plavix, and statin.  PERIPHERAL VASCULAR DISEASE: He has evidence of infrainguinal arterial occlusive disease bilaterally.   His symptoms are stable. I have ordered a follow up ABIs in 1 year. I've encouraged him to stay as active as possible. He is not a smoker.   Deitra [facility] [place] [phone]

## 2015-12-14 NOTE — Addendum Note (Signed)
Addended by: Mena Goes on: 12/14/2015 03:40 PM   Modules accepted: Orders

## 2016-01-14 ENCOUNTER — Encounter: Payer: Self-pay | Admitting: Cardiology

## 2016-01-14 NOTE — Progress Notes (Signed)
Cardiology Office Note  Date: 01/17/2016   ID: Zigmont, Feingold [birth year], MRN [id number]  PCP: [name], MD  Evaluating Cardiologist: Rozann Lesches, MD   Chief Complaint  Patient presents with  . Aortic Stenosis  . Coronary Artery Disease    History of Present Illness: Charles Lester is an 82 y.o. male that I have not seen in several years. He was more recently assessed by Dr. Harl Bowie in September.He presents for a follow-up visit. Reports no exertional chest pain, no palpitations or syncope. He is limited by chronic leg pain, uses a cane.  He has evidence of mild to moderate aortic stenosis by echocardiogram in September. Also suspected ischemic heart disease with evidence of inferolateral scar but no active ischemia by Myoview in 2010. He continue to follow with [facility] for management of carotid artery disease. He sees Dr. Scot Dock.  I reviewed his medications which are outlined below. He follows regularly with Dr. Maudie Mercury, I reviewed his most recent lab work showing good LDL control and hemoglobin A1c.  We discussed his echocardiogram findings. Plan to continue observation for now.   Past Medical History:  Diagnosis Date  . Aortic stenosis    Mild to moderate 2017  . Arthritis   . CAD (coronary artery disease)   . Essential hypertension   . Hyperlipidemia   . Ischemic heart disease    Based on Myoview 2010 revealing inferolateral scar but no active ischemia  . PAD (peripheral artery disease) ([place])   . Type 2 diabetes mellitus ([place])     History reviewed. No pertinent surgical history.  Current Outpatient Prescriptions  Medication Sig Dispense Refill  . amLODipine (NORVASC) 5 MG tablet Take 5 mg by mouth daily.      Marland Kitchen aspirin EC 81 MG tablet Take 81 mg by mouth daily.    . clopidogrel (PLAVIX) 75 MG tablet Take 75 mg by mouth daily.      . cyanocobalamin 2000 MCG tablet Take 2,000 mcg by mouth daily.    Marland Kitchen ezetimibe-simvastatin (VYTORIN) 10-20 MG per tablet Take 1  tablet by mouth at bedtime.      . finasteride (PROSCAR) 5 MG tablet Take 1 tablet by mouth daily.    Marland Kitchen lisinopril (PRINIVIL,ZESTRIL) 40 MG tablet Take 40 mg by mouth daily.      . metFORMIN (GLUCOPHAGE-XR) 500 MG 24 hr tablet Take 1 tablet by mouth 2 (two) times daily.    . metoprolol (LOPRESSOR) 50 MG tablet Take 25 mg by mouth 2 (two) times daily.      . niacin (NIASPAN) 500 MG CR tablet Take 500 mg by mouth at bedtime.    . tamsulosin (FLOMAX) 0.4 MG CAPS Take 1 capsule by mouth daily.     No current facility-administered medications for this visit.    Allergies:  Patient has no known allergies.   Social History: The patient  reports that he quit smoking about 10 years ago. He has never used smokeless tobacco. He reports that he does not drink alcohol or use drugs.   ROS:  Please see the history of present illness. Otherwise, complete review of systems is positive for arthritic pains and stiffness.  All other systems are reviewed and negative.   Physical Exam: VS:  BP (!) 142/58   Pulse 84   Ht 5\' 7"  (1.702 m)   Wt 162 lb (73.5 kg)   SpO2 99%   BMI 25.37 kg/m , BMI Body mass index is 25.37 kg/m.  Wt  Readings from Last 3 Encounters:  01/17/16 162 lb (73.5 kg)  11/17/15 163 lb (73.9 kg)  10/12/15 165 lb (74.8 kg)    General: Elderly male, appears comfortable at rest. HEENT: Conjunctiva and lids normal, oropharynx clear. Neck: Supple, no elevated JVP or carotid bruits, no thyromegaly. Lungs: Clear to auscultation, nonlabored breathing at rest. Cardiac: Regular rate and rhythm, no S3, 99991111 basal systolic murmur, no pericardial rub. Abdomen: Soft, nontender, bowel sounds present, no guarding or rebound. Extremities: No pitting edema, distal pulses 1-2+. Skin: Warm and dry. Musculoskeletal: No kyphosis. Neuropsychiatric: Alert and oriented x3, affect grossly appropriate.  ECG: I personally reviewed the tracing from 10/12/2015 which showed sinus rhythm with LVH and  repolarization abnormalities.  Recent Labwork:  July 2017: Hemoglobin 10.7, platelet 143, BUN 11, creatinine 1.0, AST 21, ALT 10, cholesterol 127, triglycerides 47, HDL 59, LDL 59, hemoglobin A1c 5.5  Other Studies Reviewed Today:  Echocardiogram 10/13/2015: Study Conclusions  - Left ventricle: The cavity size was normal. Systolic function was   normal. The estimated ejection fraction was in the range of 55%   to 60%. Wall motion was normal; there were no regional wall   motion abnormalities. Doppler parameters are consistent with   abnormal left ventricular relaxation (grade 1 diastolic   dysfunction). Mild concentric and moderate focal basal septal   hypertrophy. - Aortic valve: Moderately calcified annulus. Mildly calcified   leaflets. There was mild to moderate stenosis. There was mild to   moderate regurgitation. Peak velocity (S): 224 cm/s. Mean   gradient (S): 9 mm Hg. Valve area (VTI): 1.5 cm^2. Valve area   (Vmax): 1.47 cm^2. Valve area (Vmean): 1.51 cm^2. - Mitral valve: Calcified annulus. Mildly calcified leaflets .   There was mild regurgitation. - Left atrium: The atrium was moderately dilated. - Tricuspid valve: There was mild regurgitation.  Lexiscan Myoview 05/20/2008: IMPRESSION: Abnormal combined exercise and pharmacologic stress nuclear myocardial study revealing impaired exercise capacity, chronotropic incompetence, borderline left ventricular enlargement and the mildly impaired LV systolic function in a segmental pattern.  There was a moderate sized area of inferolateral infarction without scintigraphic or electrocardiographic evidence for myocardial ischemia.  Other findings as noted.  Carotid Dopplers 11/17/2015: 1-39% RICA stenosis, 123456 LICA stenosis.  Assessment and Plan:  1. Mild to moderate aortic stenosis with mild to moderate aortic regurgitation. Echocardiogram from September is reviewed above. He is asymptomatic at this time and we will  follow him clinically.  2. Ischemic heart disease based on prior Myoview indicating inferolateral scar. He does not report any angina symptoms. Cardiac regimen includes antiplatelet, beta blocker, ACE inhibitor, and statin. We will continue observation at this time.  3. Hyperlipidemia, on Vytorin. Most recent LDL 59.  4. Carotid artery disease, mild by recent carotid Dopplers and followed by [facility].  Current medicines were reviewed with the patient today.  Disposition: Follow-up in one year, sooner if needed.  Signed, Satira Sark, MD, [facility] 01/17/2016 9:26 AM    [place] [facility] [place]. [address] [place], [place] [address] Phone: [phone]; Fax: [phone]

## 2016-01-17 ENCOUNTER — Ambulatory Visit (INDEPENDENT_AMBULATORY_CARE_PROVIDER_SITE_OTHER): Payer: Medicare HMO | Admitting: Cardiology

## 2016-01-17 ENCOUNTER — Encounter: Payer: Self-pay | Admitting: Cardiology

## 2016-01-17 VITALS — BP 142/58 | HR 84 | Ht 67.0 in | Wt 162.0 lb

## 2016-01-17 DIAGNOSIS — I6523 Occlusion and stenosis of bilateral carotid arteries: Secondary | ICD-10-CM

## 2016-01-17 DIAGNOSIS — I350 Nonrheumatic aortic (valve) stenosis: Secondary | ICD-10-CM

## 2016-01-17 DIAGNOSIS — E782 Mixed hyperlipidemia: Secondary | ICD-10-CM | POA: Diagnosis not present

## 2016-01-17 DIAGNOSIS — I2510 Atherosclerotic heart disease of native coronary artery without angina pectoris: Secondary | ICD-10-CM

## 2016-01-17 NOTE — Patient Instructions (Signed)
Your physician wants you to follow-up in:  1 year Dr McDowell You will receive a reminder letter in the mail two months in advance. If you don't receive a letter, please call our office to schedule the follow-up appointment.    Your physician recommends that you continue on your current medications as directed. Please refer to the Current Medication list given to you today.    If you need a refill on your cardiac medications before your next appointment, please call your pharmacy.    Thank you for choosing [facility] !        

## 2016-05-17 ENCOUNTER — Encounter: Payer: Self-pay | Admitting: Vascular Surgery

## 2016-05-24 ENCOUNTER — Ambulatory Visit: Payer: Medicare HMO | Admitting: Vascular Surgery

## 2016-05-24 ENCOUNTER — Encounter (HOSPITAL_COMMUNITY): Payer: Medicare HMO

## 2016-11-22 ENCOUNTER — Encounter (HOSPITAL_COMMUNITY): Payer: Medicare HMO

## 2016-11-22 ENCOUNTER — Ambulatory Visit: Payer: Medicare HMO | Admitting: Vascular Surgery

## 2016-12-04 ENCOUNTER — Encounter (HOSPITAL_COMMUNITY): Payer: Self-pay | Admitting: Emergency Medicine

## 2016-12-04 ENCOUNTER — Emergency Department (HOSPITAL_COMMUNITY): Payer: Medicare HMO

## 2016-12-04 ENCOUNTER — Emergency Department (HOSPITAL_COMMUNITY)
Admission: EM | Admit: 2016-12-04 | Discharge: 2016-12-04 | Disposition: A | Discharge status: Home / Self Care | Payer: Medicare HMO | Attending: Nephrology | Admitting: Nephrology

## 2016-12-04 DIAGNOSIS — I2510 Atherosclerotic heart disease of native coronary artery without angina pectoris: Secondary | ICD-10-CM | POA: Insufficient documentation

## 2016-12-04 DIAGNOSIS — S79911A Unspecified injury of right hip, initial encounter: Secondary | ICD-10-CM | POA: Diagnosis present

## 2016-12-04 DIAGNOSIS — S72001A Fracture of unspecified part of neck of right femur, initial encounter for closed fracture: Secondary | ICD-10-CM | POA: Diagnosis not present

## 2016-12-04 DIAGNOSIS — Z79899 Other long term (current) drug therapy: Secondary | ICD-10-CM | POA: Diagnosis not present

## 2016-12-04 DIAGNOSIS — Y929 Unspecified place or not applicable: Secondary | ICD-10-CM | POA: Insufficient documentation

## 2016-12-04 DIAGNOSIS — E785 Hyperlipidemia, unspecified: Secondary | ICD-10-CM | POA: Diagnosis not present

## 2016-12-04 DIAGNOSIS — Z7984 Long term (current) use of oral hypoglycemic drugs: Secondary | ICD-10-CM | POA: Diagnosis not present

## 2016-12-04 DIAGNOSIS — I10 Essential (primary) hypertension: Secondary | ICD-10-CM | POA: Diagnosis not present

## 2016-12-04 DIAGNOSIS — Y998 Other external cause status: Secondary | ICD-10-CM | POA: Insufficient documentation

## 2016-12-04 DIAGNOSIS — E119 Type 2 diabetes mellitus without complications: Secondary | ICD-10-CM | POA: Insufficient documentation

## 2016-12-04 DIAGNOSIS — Z87891 Personal history of nicotine dependence: Secondary | ICD-10-CM | POA: Diagnosis not present

## 2016-12-04 DIAGNOSIS — W19XXXA Unspecified fall, initial encounter: Secondary | ICD-10-CM | POA: Insufficient documentation

## 2016-12-04 DIAGNOSIS — Y939 Activity, unspecified: Secondary | ICD-10-CM | POA: Insufficient documentation

## 2016-12-04 LAB — BASIC METABOLIC PANEL
ANION GAP: 9 (ref 5–15)
BUN: 13 mg/dL (ref 6–20)
CHLORIDE: 102 mmol/L (ref 101–111)
CO2: 26 mmol/L (ref 22–32)
CREATININE: 1.17 mg/dL (ref 0.61–1.24)
Calcium: 9.3 mg/dL (ref 8.9–10.3)
GFR calc non Af Amer: 53 mL/min — ABNORMAL LOW (ref >60)
Glucose, Bld: 112 mg/dL — ABNORMAL HIGH (ref 65–99)
POTASSIUM: 3.9 mmol/L (ref 3.5–5.1)
Sodium: 137 mmol/L (ref 135–145)

## 2016-12-04 LAB — CBC WITH DIFFERENTIAL/PLATELET
Basophils Absolute: 0 10*3/uL (ref 0.0–0.1)
Basophils Relative: 1 %
Eosinophils Absolute: 0.3 10*3/uL (ref 0.0–0.7)
Eosinophils Relative: 8 %
HEMATOCRIT: 28.9 % — AB (ref 39.0–52.0)
HEMOGLOBIN: 9.9 g/dL — AB (ref 13.0–17.0)
Lymphocytes Relative: 30 %
Lymphs Abs: 1.3 10*3/uL (ref 0.7–4.0)
MCH: 29.4 pg (ref 26.0–34.0)
MCHC: 34.3 g/dL (ref 30.0–36.0)
MCV: 85.8 fL (ref 78.0–100.0)
MONOS PCT: 7 %
Monocytes Absolute: 0.3 10*3/uL (ref 0.1–1.0)
NEUTROS ABS: 2.3 10*3/uL (ref 1.7–7.7)
NEUTROS PCT: 54 %
Platelets: 160 10*3/uL (ref 150–400)
RBC: 3.37 MIL/uL — AB (ref 4.22–5.81)
RDW: 13.9 % (ref 11.5–15.5)
WBC: 4.2 10*3/uL (ref 4.0–10.5)

## 2016-12-04 LAB — TYPE AND SCREEN
ABO/RH(D): A POS
ANTIBODY SCREEN: NEGATIVE

## 2016-12-04 LAB — PROTIME-INR
INR: 1.02
PROTHROMBIN TIME: 13.3 s (ref 11.4–15.2)

## 2016-12-04 NOTE — ED Triage Notes (Signed)
PT c/o fall in his yard landing on his left hip x10 days ago but felt a pull in his right hip. PT c/o right hip (joint) pain with ambulation since fall.

## 2016-12-04 NOTE — ED Notes (Signed)
Pt refusing hospital admission.  Signing out [facility]

## 2016-12-04 NOTE — ED Provider Notes (Signed)
[facility] EMERGENCY DEPARTMENT Provider Note   CSN: [id number] Arrival date & time: 12/04/16  1013     History   Chief Complaint Chief Complaint  Patient presents with  . Fall    HPI MARCKUS HANOVER is a 83 y.o. male.  HPI  The patient is a 83 year old male who lives with his wife, has a history of ischemic heart disease and peripheral artery disease with diabetes hyperlipidemia and mild aortic stenosis.  He presents to the hospital approximately 1 week after having a fall and landing on his lower back on the right side.  He states that he was able to get up and walk but has had significant difficulty with walking and is now using a walker whereas he previously only used a cane.  He denies any difficulty breathing chest pain head injury neck pain numbness or weakness but notes significant difficulty with any internal or external rotation of his right lower extremity.  He is able to lay on his side but with pressure on the hip it causes pain.  He denies any numbness or weakness of the legs.  His pain has been persistent, it is exacerbated with certain movements, it is not associated with swelling.  Past Medical History:  Diagnosis Date  . Aortic stenosis    Mild to moderate 2017  . Arthritis   . CAD (coronary artery disease)   . Essential hypertension   . Hyperlipidemia   . Ischemic heart disease    Based on Myoview 2010 revealing inferolateral scar but no active ischemia  . PAD (peripheral artery disease) ([place])   . Type 2 diabetes mellitus [facility])     Patient Active Problem List   Diagnosis Date Noted  . Atherosclerosis of native arteries of the extremities with intermittent claudication 06/19/2012  . Peripheral vascular disease ([place]) 03/08/2011  . MIXED HYPERLIPIDEMIA 03/06/2008  . CORONARY ATHEROSCLEROSIS NATIVE CORONARY ARTERY 03/06/2008  . CAROTID ARTERY DISEASE 03/06/2008    History reviewed. No pertinent surgical history.     Home Medications    Prior to  Admission medications   Medication Sig Start Date End Date Taking? Authorizing Provider  amLODipine (NORVASC) 5 MG tablet Take 5 mg by mouth daily.     Yes [provider]  aspirin EC 81 MG tablet Take 81 mg by mouth daily.   Yes [provider]  clopidogrel (PLAVIX) 75 MG tablet Take 75 mg by mouth daily.     Yes [provider]  ezetimibe-simvastatin (VYTORIN) 10-20 MG per tablet Take 1 tablet by mouth at bedtime.     Yes [provider]  finasteride (PROSCAR) 5 MG tablet Take 1 tablet by mouth daily. 06/11/12  Yes [provider]  lisinopril (PRINIVIL,ZESTRIL) 40 MG tablet Take 40 mg by mouth daily.     Yes [provider]  metFORMIN (GLUCOPHAGE-XR) 500 MG 24 hr tablet Take 1 tablet by mouth 2 (two) times daily. 05/24/12  Yes [provider]  metoprolol (LOPRESSOR) 50 MG tablet Take 25 mg by mouth 2 (two) times daily.     Yes [provider]  niacin (NIASPAN) 500 MG CR tablet Take 500 mg by mouth at bedtime.   Yes [provider]  tamsulosin (FLOMAX) 0.4 MG CAPS Take 1 capsule by mouth daily. 06/01/12  Yes [provider]    Family History Family History  Problem Relation Age of Onset  . Hypertension Father   . Coronary artery disease Neg Hx     Social  History Social History   Tobacco Use  . Smoking status: Former Smoker    Last attempt to quit: 02/10/2005    Years since quitting: 11.8  . Smokeless tobacco: Never Used  Substance Use Topics  . Alcohol use: No  . Drug use: No     Allergies   Patient has no known allergies.   Review of Systems Review of Systems  All other systems reviewed and are negative.    Physical Exam Updated Vital Signs BP (!) 189/51   Pulse 60   Temp 98.1 F (36.7 C) (Oral)   Resp 18   Ht 5' 7.5" (1.715 m)   Wt 72.6 kg (160 lb)   SpO2 99%   BMI 24.69 kg/m   Physical Exam  Constitutional: He appears well-developed and well-nourished. No distress.    HENT:  Head: Normocephalic and atraumatic.  Mouth/Throat: Oropharynx is clear and moist. No oropharyngeal exudate.  Eyes: Conjunctivae and EOM are normal. Pupils are equal, round, and reactive to light. Right eye exhibits no discharge. Left eye exhibits no discharge. No scleral icterus.  Neck: Normal range of motion. Neck supple. No JVD present. No thyromegaly present.  Cardiovascular: Normal rate, regular rhythm and intact distal pulses. Exam reveals no gallop and no friction rub.  Murmur ( Mild systolic) heard. Pulmonary/Chest: Effort normal and breath sounds normal. No respiratory distress. He has no wheezes. He has no rales.  Abdominal: Soft. Bowel sounds are normal. He exhibits no distension and no mass. There is no tenderness.  Musculoskeletal: He exhibits tenderness. He exhibits no edema.  Decreased range of motion of the right lower extremity with internal and external rotation secondary to pain in the hip.  He is able to flex and extend with minimal pain, there is no leg length discrepancy and he has normal range of motion of the knee and the ankle on the right side.  All other joints are supple and compartments are soft.  Lymphadenopathy:    He has no cervical adenopathy.  Neurological: He is alert. Coordination normal.  Skin: Skin is warm and dry. No rash noted. No erythema.  Psychiatric: He has a normal mood and affect. His behavior is normal.  Nursing note and vitals reviewed.    ED Treatments / Results  Labs (all labs ordered are listed, but only abnormal results are displayed) Labs Reviewed  CBC WITH DIFFERENTIAL/PLATELET - Abnormal; Notable for the following components:      Result Value   RBC 3.37 (*)    Hemoglobin 9.9 (*)    HCT 28.9 (*)    All other components within normal limits  BASIC METABOLIC PANEL - Abnormal; Notable for the following components:   Glucose, Bld 112 (*)    GFR calc non Af Amer 53 (*)    All other components within normal limits  PROTIME-INR   TYPE AND SCREEN    EKG  EKG Interpretation None       Radiology Ct Hip Right Wo Contrast  Result Date: 12/04/2016 CLINICAL DATA:  Patient fell landing on left hip 10 days ago but felt a pull in his right hip. Complains of right hip joint pain with ambulation since the fall. EXAM: CT OF THE RIGHT HIP WITHOUT CONTRAST TECHNIQUE: Multidetector CT imaging of the right hip was performed according to the standard protocol. Multiplanar CT image reconstructions were also generated. COMPARISON:  Same day right hip radiographs. FINDINGS: Bones/Joint/Cartilage Acute lateral subcapital impacted femoral neck fracture without dislocation. Intact right acetabulum and pubic rami.  Osteoarthritis of the adjacent right SI joint with bony bridging. Ligaments Suboptimally assessed by CT. Muscles and Tendons No intramuscular atrophy or hemorrhage. Soft tissues Moderate atherosclerosis of the included common iliac, internal and external iliac arteries with right internal iliac artery ectasia up to 13 mm. Mild soft tissue contusion along the lateral aspect of the right hip. IMPRESSION: 1. Acute impacted lateral subcapital femoral neck fracture without joint dislocation or significant joint effusion. Associated soft tissue contusion along the lateral aspect of the right hip. 2. Osteoarthritis of the right SI joint. 3. Atherosclerosis of the included common and internal as well as external iliac arteries with ectasia of the right internal iliac artery up to 13 mm in caliber. Electronically Signed   By: Ashley Royalty M.D.   On: 12/04/2016 13:53   Dg Hip Unilat W Or Wo Pelvis 2-3 Views Right  Result Date: 12/04/2016 CLINICAL DATA:  Right hip pain after working in the yard and falling over a rate 2 weeks ago. Pain is worse with rotation. EXAM: DG HIP (WITH OR WITHOUT PELVIS) 2-3V RIGHT COMPARISON:  AP view of both hips from a left hip series of August 05, 2007 FINDINGS: The bones are subjectively adequately mineralized. There is a  probable impacted fracture of the neck of the right femoral head. The right hip joint space is well maintained. The intertrochanteric and sub trochanteric regions appear normal. The bony pelvis exhibits no acute abnormality. IMPRESSION: Probable mildly impacted subcapital fracture of the right femur. MRI or CT scanning would be useful next imaging steps. Electronically Signed   By: David  Martinique M.D.   On: 12/04/2016 11:05    Procedures Procedures (including critical care time)  Medications Ordered in ED Medications - No data to display   Initial Impression / Assessment and Plan / ED Course  I have reviewed the triage vital signs and the nursing notes.  Pertinent labs & imaging results that were available during my care of the patient were reviewed by me and considered in my medical decision making (see chart for details).     Plain film imaging shows a probable impacted subcapital fracture of the right femur.  A CT scan has been ordered to confirm and to help surgical approach if needed.  The patient has been ambulatory though with some difficulty.  Will discuss with orthopedics once CT scan has been performed.  D/w Dr. Jonnie Finner - willa dmit D/w Dr. Aline Brochure - will consult regarding the orthopedic hip fractures  Final Clinical Impressions(s) / ED Diagnoses   Final diagnoses:  Closed fracture of right hip, initial encounter [facility])    ED Discharge Orders    None       Noemi Chapel, MD 12/04/16 1551

## 2017-01-10 ENCOUNTER — Encounter (HOSPITAL_COMMUNITY): Payer: Medicare HMO

## 2017-01-10 ENCOUNTER — Ambulatory Visit: Payer: Medicare HMO | Admitting: Vascular Surgery

## 2017-01-12 ENCOUNTER — Ambulatory Visit: Payer: Medicare HMO | Admitting: Cardiology

## 2017-01-16 ENCOUNTER — Ambulatory Visit: Payer: Medicare HMO | Admitting: Cardiology

## 2017-02-20 NOTE — Progress Notes (Signed)
Cardiology Office Note  Date: 02/21/2017   ID: Maximilliano, Kersh 10/09/1924, MRN [id number]  PCP: [name], MD  Primary Cardiologist: Rozann Lesches, MD   Chief Complaint  Patient presents with  . Aortic Stenosis    History of Present Illness: Charles Lester is a 84 y.o. male last seen in December 2017. He presents for a routine follow-up visit. Describes no significant change in stamina. He is limited by chronic right knee pain, using a cane at this time to get around. He reports NYHA class II dyspnea, no exertional chest pain or syncope.  He has not had follow-up with [facility] in the last year. Carotid Dopplers from October 2017 revealed 1-39% bilateral ICA stenoses with greater than 50% left mid common carotid artery stenosis.  Echocardiogram from September 2017 showed mild to moderate aortic stenosis with mean gradient 9 mmHg.  I reviewed his medications which are stable and outlined below.  Past Medical History:  Diagnosis Date  . Aortic stenosis    Mild to moderate 2017  . Arthritis   . CAD (coronary artery disease)   . Essential hypertension   . Hyperlipidemia   . Ischemic heart disease    Based on Myoview 2010 revealing inferolateral scar but no active ischemia  . PAD (peripheral artery disease) ([place])   . Type 2 diabetes mellitus ([place])     History reviewed. No pertinent surgical history.  Current Outpatient Medications  Medication Sig Dispense Refill  . amLODipine (NORVASC) 5 MG tablet Take 5 mg by mouth daily.      Marland Kitchen aspirin EC 81 MG tablet Take 81 mg by mouth daily.    Marland Kitchen atorvastatin (LIPITOR) 20 MG tablet Take 20 mg by mouth daily.    . clopidogrel (PLAVIX) 75 MG tablet Take 75 mg by mouth daily.      . ferrous sulfate 325 (65 FE) MG tablet Take 325 mg by mouth daily with breakfast.    . finasteride (PROSCAR) 5 MG tablet Take 1 tablet by mouth daily.    Marland Kitchen lisinopril (PRINIVIL,ZESTRIL) 40 MG tablet Take 40 mg by mouth daily.      . metFORMIN  (GLUCOPHAGE-XR) 500 MG 24 hr tablet Take 250 tablets by mouth daily.     . metoprolol (LOPRESSOR) 50 MG tablet Take 25 mg by mouth 2 (two) times daily.      . niacin (NIASPAN) 500 MG CR tablet Take 500 mg by mouth at bedtime.    . tamsulosin (FLOMAX) 0.4 MG CAPS Take 1 capsule by mouth daily.    . vitamin C (ASCORBIC ACID) 500 MG tablet Take 500 mg by mouth daily.     No current facility-administered medications for this visit.    Allergies:  Patient has no known allergies.   Social History: The patient  reports that he quit smoking about 12 years ago. he has never used smokeless tobacco. He reports that he does not drink alcohol or use drugs.   ROS:  Please see the history of present illness. Otherwise, complete review of systems is positive for hearing loss, arthritic pains.  All other systems are reviewed and negative.   Physical Exam: VS:  BP 110/60   Pulse (!) 59   Ht 5' 7.5" (1.715 m)   Wt 150 lb (68 kg)   SpO2 98%   BMI 23.15 kg/m , BMI Body mass index is 23.15 kg/m.  Wt Readings from Last 3 Encounters:  02/21/17 150 lb (68 kg)  12/04/16 160 lb (  72.6 kg)  01/17/16 162 lb (73.5 kg)    General: Elderly male, appears comfortable at rest. HEENT: Conjunctiva and lids normal, oropharynx clear. Neck: Supple, no elevated JVP or carotid bruits, no thyromegaly. Lungs: Clear to auscultation, nonlabored breathing at rest. Cardiac: Regular rate and rhythm, no S3, 3-5/7 systolic murmur, no pericardial rub. Abdomen: Soft, nontender, bowel sounds present. Extremities: No pitting edema, distal pulses 1-2+. Skin: Warm and dry. Musculoskeletal: No kyphosis. Neuropsychiatric: Alert and oriented x3, affect grossly appropriate.  ECG: I personally reviewed the tracing from 12/04/2016 which showed sinus rhythm with nonspecific ST-T changes.  Recent Labwork: 12/04/2016: BUN 13; Creatinine, Ser 1.17; Hemoglobin 9.9; Platelets 160; Potassium 3.9; Sodium 137   Other Studies Reviewed  Today:  Echocardiogram 10/13/2015: Study Conclusions  - Left ventricle: The cavity size was normal. Systolic function was   normal. The estimated ejection fraction was in the range of 55%   to 60%. Wall motion was normal; there were no regional wall   motion abnormalities. Doppler parameters are consistent with   abnormal left ventricular relaxation (grade 1 diastolic   dysfunction). Mild concentric and moderate focal basal septal   hypertrophy. - Aortic valve: Moderately calcified annulus. Mildly calcified   leaflets. There was mild to moderate stenosis. There was mild to   moderate regurgitation. Peak velocity (S): 224 cm/s. Mean   gradient (S): 9 mm Hg. Valve area (VTI): 1.5 cm^2. Valve area   (Vmax): 1.47 cm^2. Valve area (Vmean): 1.51 cm^2. - Mitral valve: Calcified annulus. Mildly calcified leaflets .   There was mild regurgitation. - Left atrium: The atrium was moderately dilated. - Tricuspid valve: There was mild regurgitation.  Assessment and Plan:  1. Mild to moderate aortic stenosis and regurgitation, last assessed in 2017. Follow-up echocardiogram will be obtained.  2. Ischemic heart disease based on previous Myoview. He does not report any active angina on medical therapy which is stable as outlined above. No changes were made today. Anticipate conservative management.  3. Mixed hyperlipidemia, on statin therapy. He is following lipids with PCP.  4. Nonobstructive carotid artery disease. He is on aspirin and statin therapy.  Current medicines were reviewed with the patient today.  Disposition: Follow-up in one year.  Signed, Satira Sark, MD, [facility] 02/21/2017 11:58 AM    [place] [facility] [place], [place], [place] [address] Phone: [phone]; Fax: [phone]

## 2017-02-21 ENCOUNTER — Ambulatory Visit: Payer: Medicare HMO | Admitting: Cardiology

## 2017-02-21 ENCOUNTER — Encounter: Payer: Self-pay | Admitting: Cardiology

## 2017-02-21 VITALS — BP 110/60 | HR 59 | Ht 67.5 in | Wt 150.0 lb

## 2017-02-21 DIAGNOSIS — I350 Nonrheumatic aortic (valve) stenosis: Secondary | ICD-10-CM | POA: Diagnosis not present

## 2017-02-21 DIAGNOSIS — E782 Mixed hyperlipidemia: Secondary | ICD-10-CM

## 2017-02-21 DIAGNOSIS — I2510 Atherosclerotic heart disease of native coronary artery without angina pectoris: Secondary | ICD-10-CM

## 2017-02-21 DIAGNOSIS — I6523 Occlusion and stenosis of bilateral carotid arteries: Secondary | ICD-10-CM | POA: Diagnosis not present

## 2017-02-21 NOTE — Patient Instructions (Signed)
Medication Instructions:  Your physician recommends that you continue on your current medications as directed. Please refer to the Current Medication list given to you today.  Labwork: NONE  Testing/Procedures: Your physician has requested that you have an echocardiogram. Echocardiography is a painless test that uses sound waves to create images of your heart. It provides your doctor with information about the size and shape of your heart and how well your heart's chambers and valves are working. This procedure takes approximately one hour. There are no restrictions for this procedure.  Follow-Up: Your physician wants you to follow-up in: 1 YEAR WITH DR. MCDOWELL. You will receive a reminder letter in the mail two months in advance. If you don't receive a letter, please call our office to schedule the follow-up appointment.  Any Other Special Instructions Will Be Listed Below (If Applicable).  If you need a refill on your cardiac medications before your next appointment, please call your pharmacy. 

## 2017-02-21 NOTE — Addendum Note (Signed)
Addended by: Acquanetta Chain on: 02/21/2017 11:59 AM   Modules accepted: Orders

## 2017-02-27 ENCOUNTER — Ambulatory Visit: Payer: Medicare HMO | Admitting: Urology

## 2017-02-27 DIAGNOSIS — R972 Elevated prostate specific antigen [PSA]: Secondary | ICD-10-CM

## 2017-02-27 DIAGNOSIS — N401 Enlarged prostate with lower urinary tract symptoms: Secondary | ICD-10-CM | POA: Diagnosis not present

## 2017-02-27 DIAGNOSIS — N5201 Erectile dysfunction due to arterial insufficiency: Secondary | ICD-10-CM

## 2017-02-27 DIAGNOSIS — R351 Nocturia: Secondary | ICD-10-CM | POA: Diagnosis not present

## 2017-02-28 ENCOUNTER — Ambulatory Visit (HOSPITAL_COMMUNITY)
Admission: RE | Admit: 2017-02-28 | Discharge: 2017-02-28 | Disposition: A | Discharge status: Home / Self Care | Payer: Medicare HMO | Source: Ambulatory Visit | Attending: Vascular Surgery | Admitting: Vascular Surgery

## 2017-02-28 ENCOUNTER — Ambulatory Visit: Payer: Medicare HMO | Admitting: Vascular Surgery

## 2017-02-28 ENCOUNTER — Encounter: Payer: Self-pay | Admitting: Vascular Surgery

## 2017-02-28 VITALS — BP 175/62 | HR 60 | Temp 97.5°F | Resp 16 | Ht 67.5 in | Wt 154.0 lb

## 2017-02-28 DIAGNOSIS — I739 Peripheral vascular disease, unspecified: Secondary | ICD-10-CM

## 2017-02-28 DIAGNOSIS — I2510 Atherosclerotic heart disease of native coronary artery without angina pectoris: Secondary | ICD-10-CM | POA: Insufficient documentation

## 2017-02-28 DIAGNOSIS — Z87891 Personal history of nicotine dependence: Secondary | ICD-10-CM | POA: Diagnosis not present

## 2017-02-28 DIAGNOSIS — I10 Essential (primary) hypertension: Secondary | ICD-10-CM | POA: Insufficient documentation

## 2017-02-28 DIAGNOSIS — E785 Hyperlipidemia, unspecified: Secondary | ICD-10-CM | POA: Diagnosis not present

## 2017-02-28 DIAGNOSIS — E1151 Type 2 diabetes mellitus with diabetic peripheral angiopathy without gangrene: Secondary | ICD-10-CM | POA: Diagnosis not present

## 2017-02-28 NOTE — Progress Notes (Signed)
History of Present Illness:  Patient is a 84 y.o. year old male who presents for follow up evaluation of claudication. He has known claudication symptoms bilaterally which he can tolerate.  He has no history of ulcers and he denise rest pain.  We are also following him for carotid stenosis.  He has had no history of stroke or TIA.  He denise amaurosis, aphasia and sudden weakness in his extremities.  He continues to take a daily aspirin, Lipitor and manages his BP and DM.    Past Medical History:  Diagnosis Date  . Aortic stenosis    Mild to moderate 2017  . Arthritis   . CAD (coronary artery disease)   . Essential hypertension   . Hyperlipidemia   . Ischemic heart disease    Based on Myoview 2010 revealing inferolateral scar but no active ischemia  . PAD (peripheral artery disease) ([place])   . Type 2 diabetes mellitus (HCC)     No past surgical history on file.  ROS:   General:  No weight loss, Fever, chills  HEENT: No recent headaches, no nasal bleeding, no visual changes, no sore throat  Neurologic: No dizziness, blackouts, seizures. No recent symptoms of stroke or mini- stroke. No recent episodes of slurred speech, or temporary blindness.  Cardiac: No recent episodes of chest pain/pressure, no shortness of breath at rest.  No shortness of breath with exertion.  Denies history of atrial fibrillation or irregular heartbeat  Vascular: No history of rest pain in feet.  No history of claudication.  No history of non-healing ulcer, No history of DVT   Pulmonary: No home oxygen, no productive cough, no hemoptysis,  No asthma or wheezing  Musculoskeletal:  [ x] Arthritis, [ ]  Low back pain,  [ ]  Joint pain  Hematologic:No history of hypercoagulable state.  No history of easy bleeding.  No history of anemia  Gastrointestinal: No hematochezia or melena,  No gastroesophageal reflux, no trouble swallowing  Urinary: [ ]  chronic Kidney disease, [ ]  on HD - [ ]  MWF or [ ]  TTHS, [ ]   Burning with urination, [ ]  Frequent urination, [ ]  Difficulty urinating;   Skin: No rashes  Psychological: No history of anxiety,  No history of depression  Social History Social History   Tobacco Use  . Smoking status: Former Smoker    Last attempt to quit: 02/10/2005    Years since quitting: 12.0  . Smokeless tobacco: Never Used  Substance Use Topics  . Alcohol use: No  . Drug use: No    Family History Family History  Problem Relation Age of Onset  . Hypertension Father   . Coronary artery disease Neg Hx     Allergies  No Known Allergies   Current Outpatient Medications  Medication Sig Dispense Refill  . amLODipine (NORVASC) 5 MG tablet Take 5 mg by mouth daily.      Marland Kitchen aspirin EC 81 MG tablet Take 81 mg by mouth daily.    Marland Kitchen atorvastatin (LIPITOR) 20 MG tablet Take 20 mg by mouth daily.    . clopidogrel (PLAVIX) 75 MG tablet Take 75 mg by mouth daily.      . ferrous sulfate 325 (65 FE) MG tablet Take 325 mg by mouth daily with breakfast.    . finasteride (PROSCAR) 5 MG tablet Take 1 tablet by mouth daily.    Marland Kitchen lisinopril (PRINIVIL,ZESTRIL) 40 MG tablet Take 40 mg by mouth daily.      . metFORMIN (  GLUCOPHAGE-XR) 500 MG 24 hr tablet Take 250 tablets by mouth daily.     . metoprolol (LOPRESSOR) 50 MG tablet Take 25 mg by mouth 2 (two) times daily.      . niacin (NIASPAN) 500 MG CR tablet Take 500 mg by mouth at bedtime.    . tamsulosin (FLOMAX) 0.4 MG CAPS Take 1 capsule by mouth daily.    . vitamin C (ASCORBIC ACID) 500 MG tablet Take 500 mg by mouth daily.     No current facility-administered medications for this visit.     Physical Examination  There were no vitals filed for this visit.  There is no height or weight on file to calculate BMI.  General:  Alert and oriented, no acute distress HEENT: Normal Neck: No bruit or JVD Pulmonary: Clear to auscultation bilaterally Cardiac: Regular Rate and Rhythm with murmur Abdomen: Soft, non-tender, non-distended,  no mass, no scars Skin: No rash, chronic left pre tibial skin darkening discoloration without wounds. Extremity Pulses:  2+ radial, brachial, femoral, left popliteal ,  Non palpable dorsalis pedis, posterior tibial pulses bilaterally Musculoskeletal: No deformity, minimal left LE edema compared to the right  Neurologic: Upper and lower extremity motor grossly and symmetric  DATA:  ABI Right 0.64 TBI 0.41 Left 0.68 TBI 0.29   ASSESSMENT:  Stable PAD with claudication and no rest pain of non healing ulcers Carotid stenosis B asymptomatic    PLAN:  He has known claudication symptoms that are stable and tolerable.  He has essentially unchanged ABI's from previous visit and no rest pain.  We will repeat ABI's and carotid duplex study B in 1 year.  We reviewed signs and symptoms of stroke.  If he has any new symptoms concerting stroke or rest pain with or without non healing wounds he will call sooner.  He is a non smoker and takes an aspirin and statin daily.     Roxy Horseman PA-C [facility]  The patient was seen in conjunction with Dr. Scot Dock

## 2017-03-07 ENCOUNTER — Ambulatory Visit (INDEPENDENT_AMBULATORY_CARE_PROVIDER_SITE_OTHER): Payer: Medicare HMO

## 2017-03-07 ENCOUNTER — Telehealth: Payer: Self-pay

## 2017-03-07 ENCOUNTER — Other Ambulatory Visit: Payer: Self-pay

## 2017-03-07 DIAGNOSIS — I350 Nonrheumatic aortic (valve) stenosis: Secondary | ICD-10-CM

## 2017-03-07 NOTE — Telephone Encounter (Signed)
Line busy. Unable to LM

## 2017-03-07 NOTE — Telephone Encounter (Signed)
-----   Message from Merlene Laughter, LPN sent at 10/04/3965  2:58 PM EST -----   ----- Message ----- From: Satira Sark, MD Sent: 03/07/2017   2:53 PM To: [name], MD, Merlene Laughter, LPN  Results reviewed.  LVEF remains normal at 55-60%.  Aortic stenosis graded in mild range with mild to moderate aortic regurgitation.  Reassuring findings, continue with routine follow-up. A copy of this test should be forwarded to [name], MD.

## 2017-03-09 NOTE — Telephone Encounter (Signed)
-----   Message from Merlene Laughter, LPN sent at 03/07/6387  2:58 PM EST -----   ----- Message ----- From: Satira Sark, MD Sent: 03/07/2017   2:53 PM To: [name], MD, Merlene Laughter, LPN  Results reviewed.  LVEF remains normal at 55-60%.  Aortic stenosis graded in mild range with mild to moderate aortic regurgitation.  Reassuring findings, continue with routine follow-up. A copy of this test should be forwarded to [name], MD.

## 2017-03-09 NOTE — Telephone Encounter (Signed)
Patient notified. Routed to PCP 

## 2018-02-19 ENCOUNTER — Ambulatory Visit: Payer: Medicare HMO | Admitting: Urology

## 2018-02-19 DIAGNOSIS — N5201 Erectile dysfunction due to arterial insufficiency: Secondary | ICD-10-CM

## 2018-02-19 DIAGNOSIS — R351 Nocturia: Secondary | ICD-10-CM | POA: Diagnosis not present

## 2018-02-19 DIAGNOSIS — N401 Enlarged prostate with lower urinary tract symptoms: Secondary | ICD-10-CM | POA: Diagnosis not present

## 2018-02-27 NOTE — Progress Notes (Signed)
Cardiology Office Note  Date: 02/28/2018   ID: [name], [name] [birth year], MRN [id number]  PCP: [name], MD  Primary Cardiologist: Rozann Lesches, MD   Chief Complaint  Patient presents with  . Cardiac follow-up    History of Present Illness: Charles Lester is a 85 y.o. male last seen in January 2019.  He is here for a routine follow-up visit.  He tells me that he has had no major change in health, no hospitalizations in the last year.  He still lives in his own home and functions with ADLs.  He does not report any angina symptoms, has had no palpitations or syncope.  Follow-up echocardiogram in February 2019 revealed LVEF 55 to 60% with mild diastolic dysfunction, overall mild aortic stenosis with mean gradient 11 mmHg and mild to moderate aortic regurgitation.  I discussed the results with him today.  We went over his medications.  He reports compliance and continues to follow with Dr. Maudie Mercury.  I personally reviewed his ECG today which shows sinus rhythm with PACs, diffuse nonspecific ST-T changes.  Past Medical History:  Diagnosis Date  . Aortic stenosis    Mild to moderate 2017  . Arthritis   . CAD (coronary artery disease)   . Essential hypertension   . Hyperlipidemia   . Ischemic heart disease    Based on Myoview 2010 revealing inferolateral scar but no active ischemia  . PAD (peripheral artery disease) ([place])   . Type 2 diabetes mellitus ([place])     History reviewed. No pertinent surgical history.  Current Outpatient Medications  Medication Sig Dispense Refill  . amLODipine (NORVASC) 5 MG tablet Take 5 mg by mouth daily.      Marland Kitchen aspirin EC 81 MG tablet Take 81 mg by mouth daily.    Marland Kitchen atorvastatin (LIPITOR) 20 MG tablet Take 20 mg by mouth daily.    . clopidogrel (PLAVIX) 75 MG tablet Take 75 mg by mouth daily.      . ferrous sulfate 325 (65 FE) MG tablet Take 325 mg by mouth daily with breakfast.    . finasteride (PROSCAR) 5 MG tablet Take 1 tablet by mouth  daily.    Marland Kitchen lisinopril (PRINIVIL,ZESTRIL) 40 MG tablet Take 40 mg by mouth daily.      . metFORMIN (GLUCOPHAGE-XR) 500 MG 24 hr tablet Take 250 tablets by mouth daily.     . metoprolol (LOPRESSOR) 50 MG tablet Take 25 mg by mouth 2 (two) times daily.      . niacin (NIASPAN) 500 MG CR tablet Take 500 mg by mouth at bedtime.    . tamsulosin (FLOMAX) 0.4 MG CAPS Take 1 capsule by mouth daily.    . vitamin C (ASCORBIC ACID) 500 MG tablet Take 500 mg by mouth daily.     No current facility-administered medications for this visit.    Allergies:  Patient has no known allergies.   Social History: The patient  reports that he quit smoking about 13 years ago. He has never used smokeless tobacco. He reports that he does not drink alcohol or use drugs.   ROS:  Please see the history of present illness. Otherwise, complete review of systems is positive for hearing loss.  All other systems are reviewed and negative.   Physical Exam: VS:  BP (!) 166/57   Pulse 70   Ht 5\' 7"  (1.702 m)   Wt 153 lb 9.6 oz (69.7 kg)   SpO2 100%   BMI 24.06 kg/m ,  BMI Body mass index is 24.06 kg/m.  Wt Readings from Last 3 Encounters:  02/28/18 153 lb 9.6 oz (69.7 kg)  02/28/17 154 lb (69.9 kg)  02/21/17 150 lb (68 kg)    General: Elderly male, appears comfortable at rest.  Uses a cane. HEENT: Conjunctiva and lids normal, oropharynx clear. Neck: Supple, no elevated JVP or carotid bruits, no thyromegaly. Lungs: Clear to auscultation, nonlabored breathing at rest. Cardiac: Regular rate and rhythm, no S3, 2/6 systolic murmur. Abdomen: Soft, nontender, bowel sounds present. Extremities: No pitting edema, distal pulses 2+. Skin: Warm and dry. Musculoskeletal: No kyphosis. Neuropsychiatric: Alert and oriented x3, affect grossly appropriate.  ECG: I personally reviewed the tracing from 12/04/2016 which showed sinus rhythm with nonspecific ST-T changes, early repolarization.  Recent Labwork:  12/04/2016: BUN 13;  Creatinine, Ser 1.17; Hemoglobin 9.9; Platelets 160; Potassium 3.9; Sodium 137   Other Studies Reviewed Today:  Echocardiogram 03/07/2017: Study Conclusions  - Left ventricle: The cavity size was normal. Wall thickness was   normal. Systolic function was normal. The estimated ejection   fraction was in the range of 55% to 60%. Wall motion was normal;   there were no regional wall motion abnormalities. Doppler   parameters are consistent with abnormal left ventricular   relaxation (grade 1 diastolic dysfunction). - Aortic valve: Mildly calcified annulus. Mildly thickened   leaflets. There was mild stenosis. There was mild to moderate   regurgitation. Mean gradient (S): 11 mm Hg. Valve area (VTI):   1.51 cm^2. Valve area (Vmax): 1.57 cm^2. Valve area (Vmean): 1.52   cm^2. - Mitral valve: Mildly calcified annulus. Mildly thickened leaflets. - Left atrium: The atrium was moderately dilated. - Technically adequate study.  Assessment and Plan:  1.  Aortic valve disease, mild stenosis and mild to moderate regurgitation by echocardiogram last year.  He is asymptomatic.  2.  Ischemic heart disease based on previous noninvasive imaging.  He does not report progressive angina and we plan to continue medical therapy and observation.  ECG reviewed.  3.  Mixed hyperlipidemia, continues on Lipitor with follow-up per Dr. Maudie Mercury.  4.  Asymptomatic, nonobstructive carotid artery disease.  He remains on aspirin and statin.  Current medicines were reviewed with the patient today.   Orders Placed This Encounter  Procedures  . EKG 12-Lead    Disposition: Follow-up in 1 year.  Signed, Satira Sark, MD, [facility] 02/28/2018 9:32 AM    [facility] [place], [place], [place] [address] Phone: [phone]; Fax: [phone]

## 2018-02-28 ENCOUNTER — Encounter: Payer: Self-pay | Admitting: *Deleted

## 2018-02-28 ENCOUNTER — Ambulatory Visit (INDEPENDENT_AMBULATORY_CARE_PROVIDER_SITE_OTHER): Payer: Medicare HMO | Admitting: Cardiology

## 2018-02-28 VITALS — BP 166/57 | HR 70 | Ht 67.0 in | Wt 153.6 lb

## 2018-02-28 DIAGNOSIS — I2510 Atherosclerotic heart disease of native coronary artery without angina pectoris: Secondary | ICD-10-CM

## 2018-02-28 DIAGNOSIS — E782 Mixed hyperlipidemia: Secondary | ICD-10-CM

## 2018-02-28 DIAGNOSIS — I6523 Occlusion and stenosis of bilateral carotid arteries: Secondary | ICD-10-CM | POA: Diagnosis not present

## 2018-02-28 DIAGNOSIS — I350 Nonrheumatic aortic (valve) stenosis: Secondary | ICD-10-CM | POA: Diagnosis not present

## 2018-02-28 NOTE — Patient Instructions (Addendum)

## 2018-10-18 IMAGING — CT CT HIP*R* W/O CM
2 of 3 series · 17 of 46 positions shown, 19 images · non-contrast
Comparison: Same day right hip radiographs.

CLINICAL DATA: Patient fell landing on left hip 10 days ago but
felt a pull in his right hip. Complains of right hip joint pain with
ambulation since the fall.

EXAM:
CT OF THE RIGHT HIP WITHOUT CONTRAST
TECHNIQUE: Multidetector CT imaging of the right hip was performed according to
the standard protocol. Multiplanar CT image reconstructions were
also generated.

[Series 3: axial st · axial · 0.40mm/px · z∈[+1009,+1229]mm · 14 of 128 slices shown, 16 images]
[im 9/128  soft-tissue]
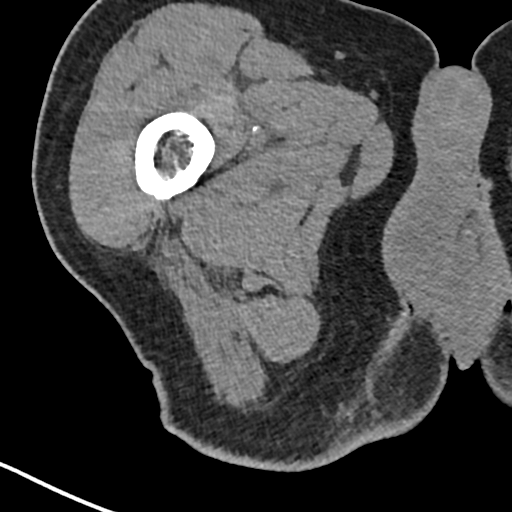
[im 9/128  bone]
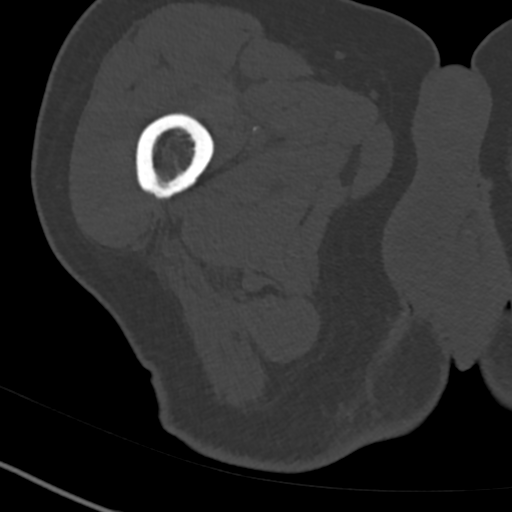
[im 17/128  soft-tissue]
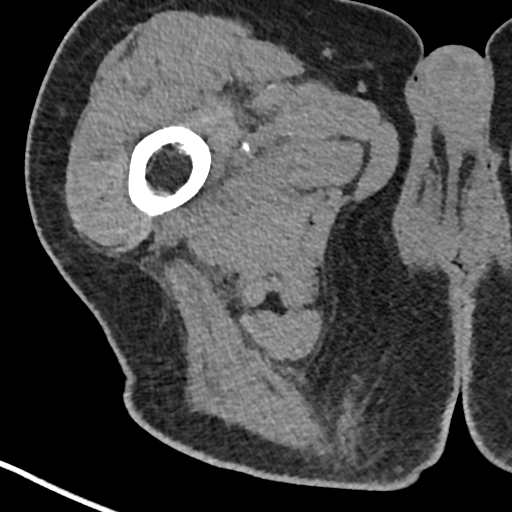
[im 25/128  soft-tissue]
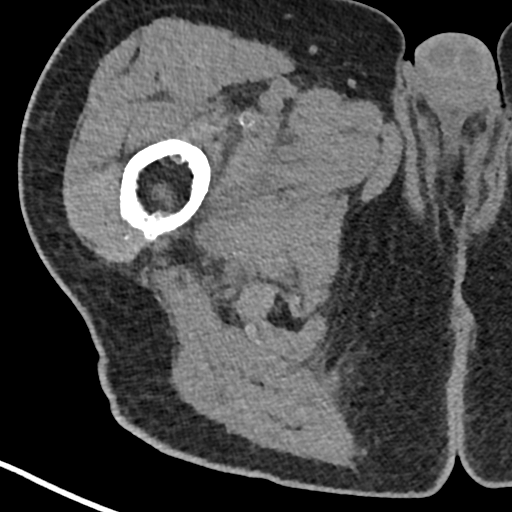
[im 33/128  soft-tissue]
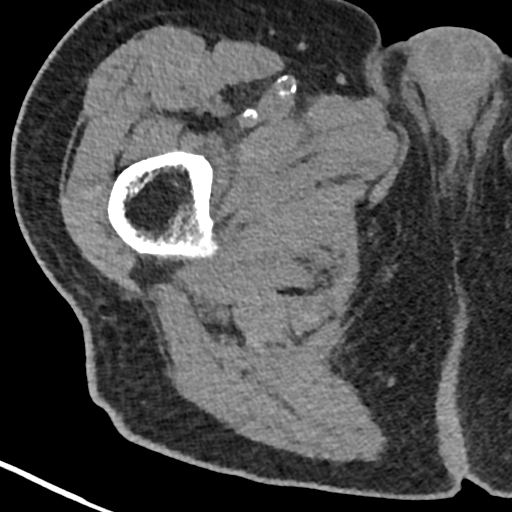
[im 41/128  soft-tissue]
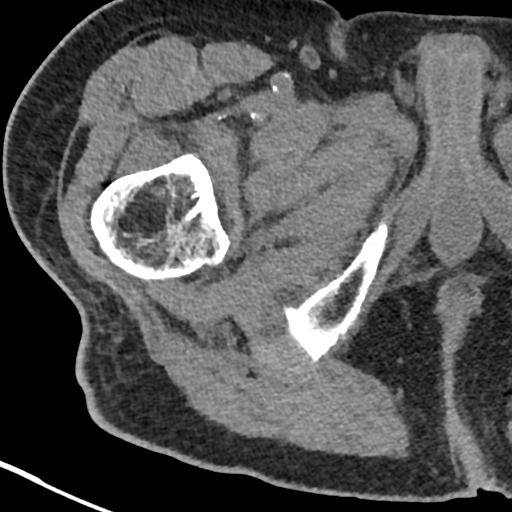
[im 50/128  soft-tissue]
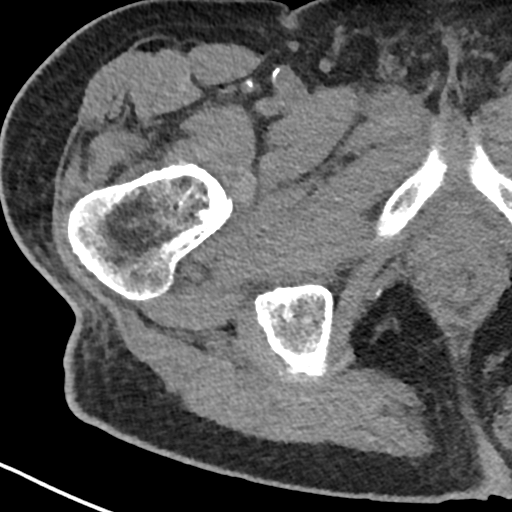
[im 58/128  soft-tissue]
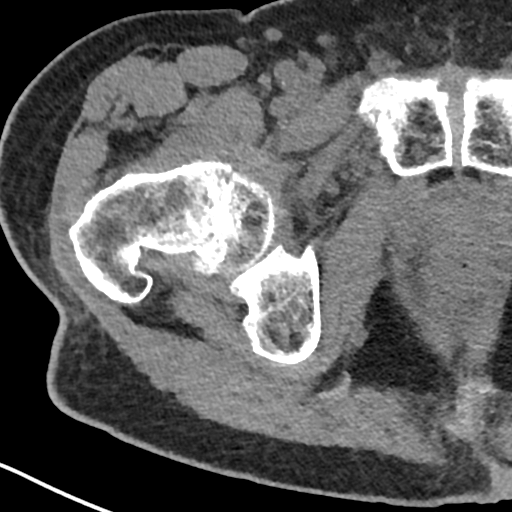
[im 70/128  soft-tissue]
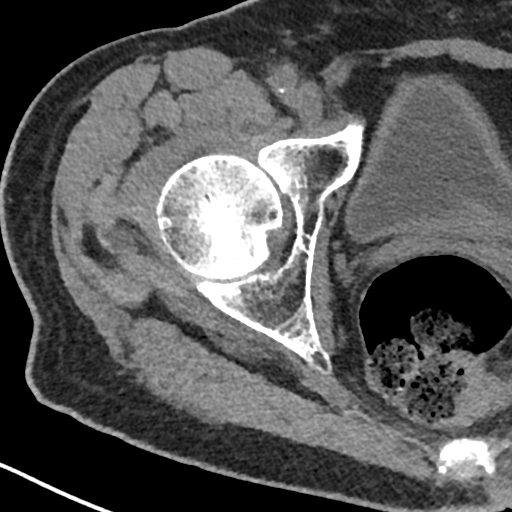
[im 78/128  soft-tissue]
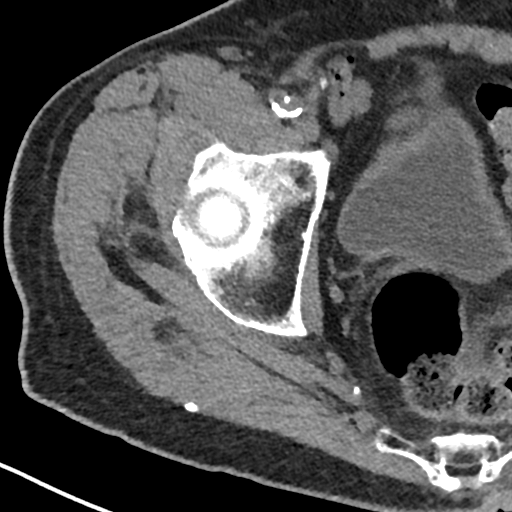
[im 78/128  bone]
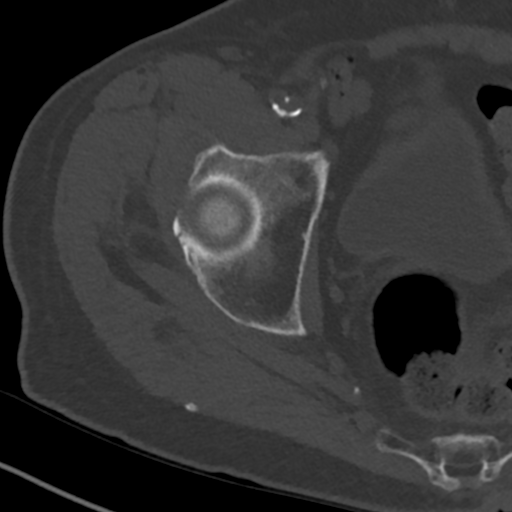
[im 87/128  soft-tissue]
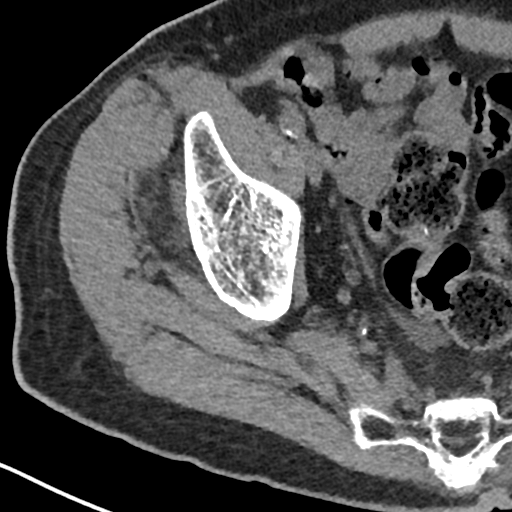
[im 95/128  soft-tissue]
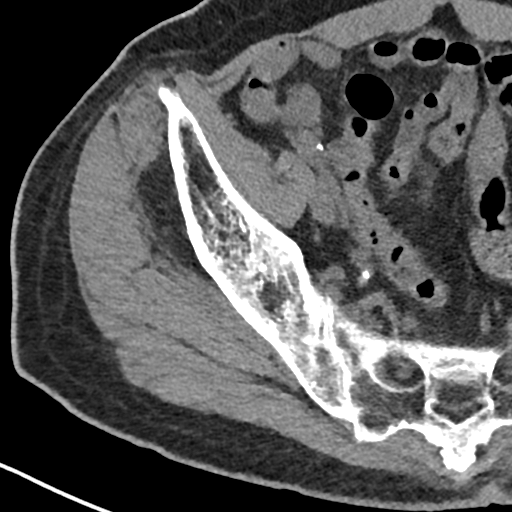
[im 103/128  soft-tissue]
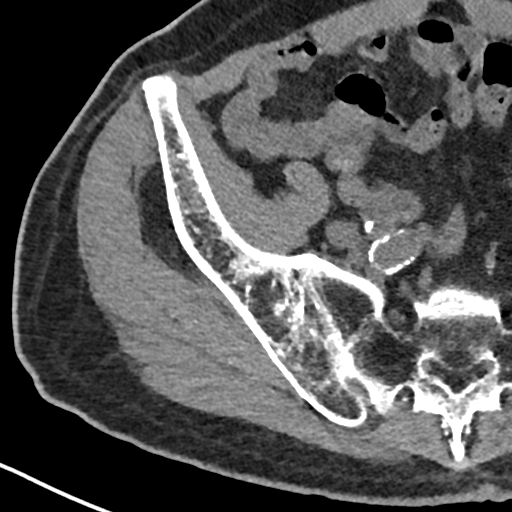
[im 111/128  soft-tissue]
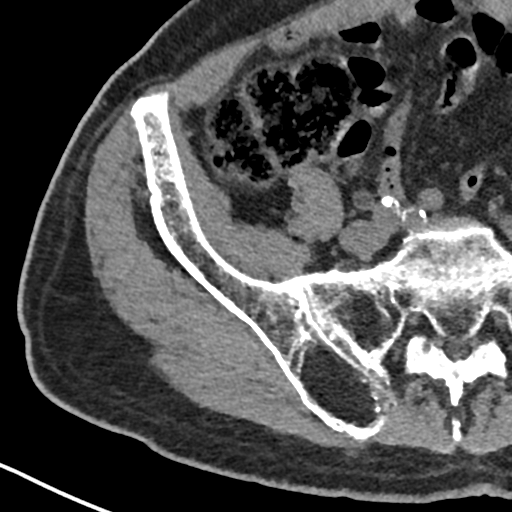
[im 119/128  soft-tissue]
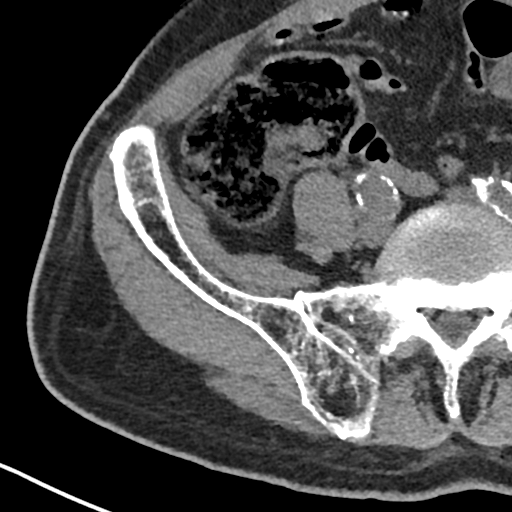

[Series 8: coronal st · coronal · 0.44mm/px · 3 of 97 slices shown]
[im 33/97  soft-tissue]
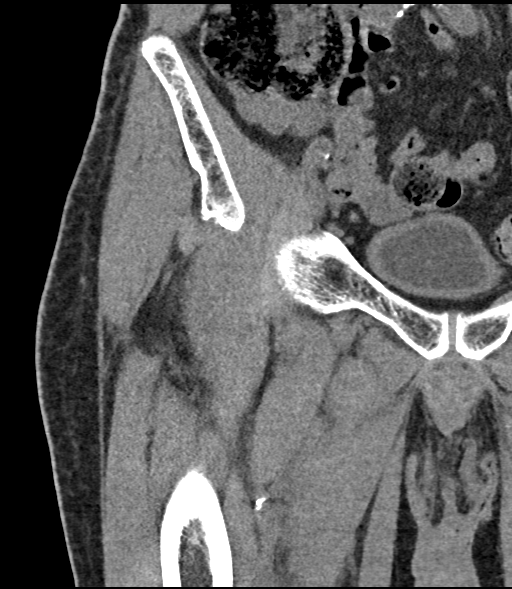
[im 43/97  soft-tissue]
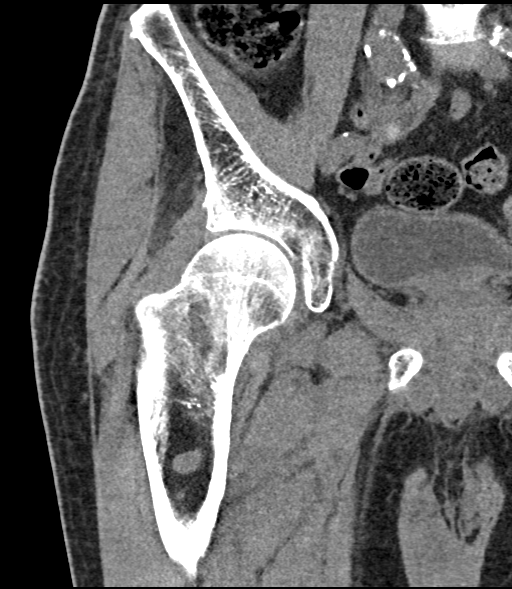
[im 54/97  soft-tissue]
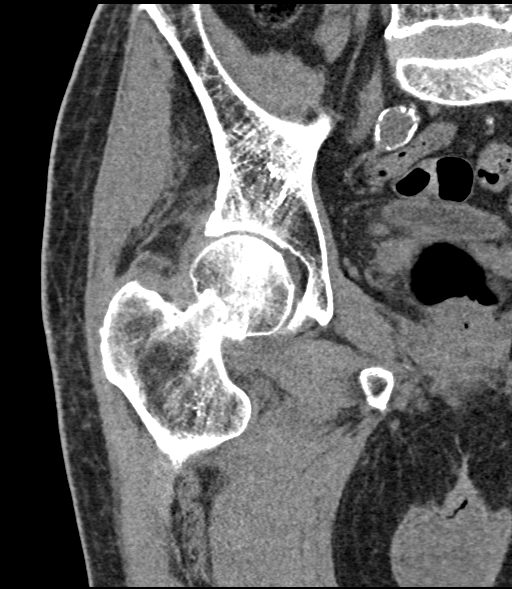

[17 of 46 positions shown; findings below may reference images not displayed]

FINDINGS: Bones/Joint/Cartilage

Acute lateral subcapital impacted femoral neck fracture without
dislocation. Intact right acetabulum and pubic rami. Osteoarthritis
of the adjacent right SI joint with bony bridging.

Ligaments

Suboptimally assessed by CT.

Muscles and Tendons

No intramuscular atrophy or hemorrhage.

Soft tissues

Moderate atherosclerosis of the included common iliac, internal and
external iliac arteries with right internal iliac artery ectasia up
to 13 mm. Mild soft tissue contusion along the lateral aspect of the
right hip.
IMPRESSION: 1. Acute impacted lateral subcapital femoral neck fracture without
joint dislocation or significant joint effusion. Associated soft
tissue contusion along the lateral aspect of the right hip.
2. Osteoarthritis of the right SI joint.
3. Atherosclerosis of the included common and internal as well as
external iliac arteries with ectasia of the right internal iliac
artery up to 13 mm in caliber.

## 2019-01-21 ENCOUNTER — Encounter: Payer: Self-pay | Admitting: Urology

## 2019-03-18 ENCOUNTER — Other Ambulatory Visit: Payer: Self-pay

## 2019-03-21 ENCOUNTER — Telehealth: Payer: Self-pay

## 2019-03-21 NOTE — Telephone Encounter (Signed)
-----   Message from Franchot Gallo, MD sent at 03/20/2019  1:10 PM EST ----- Regarding: RE: PCP labs to review Got it--PSA 32. He needs OV ----- Message ----- From: Dorisann Frames, RN Sent: 03/18/2019   1:40 PM EST To: Franchot Gallo, MD Subject: PCP labs to review                              ----- Message ----- From: Charlestine Massed Sent: 03/18/2019   1:38 PM EST To: [facility] [employer] [facility]  Please review labs.

## 2019-03-21 NOTE — Telephone Encounter (Signed)
Spoke with pt. appt scheduled for Tuesday, February 23  at 1pm.

## 2019-03-25 ENCOUNTER — Encounter: Payer: Self-pay | Admitting: Urology

## 2019-03-25 ENCOUNTER — Ambulatory Visit (INDEPENDENT_AMBULATORY_CARE_PROVIDER_SITE_OTHER): Payer: Medicare HMO | Admitting: Urology

## 2019-03-25 ENCOUNTER — Other Ambulatory Visit: Payer: Self-pay

## 2019-03-25 VITALS — BP 173/72 | HR 77 | Temp 98.2°F

## 2019-03-25 DIAGNOSIS — R972 Elevated prostate specific antigen [PSA]: Secondary | ICD-10-CM | POA: Diagnosis not present

## 2019-03-25 NOTE — Progress Notes (Signed)
H&P  Chief Complaint: Elevated PSA  History of Present Illness:   2.23.2021: Referred today for an elevated PSA measured by his PCP at his recent visit -- 32.6 (65.2 w/ finasteride correction) on 2.6.2021. He continues on finasteride and tamsulosin and denies any significant voiding sx's. He denies any new bony pain but does c/o muscular pain in his upper thighs. He denies any significant changes in weight since he was last seen here a year ago and maintains that his appetite has been very strong.   He does state today that he was previously diagnosed with prostate cancer around 86 yo (per Dr Scotty Court).  IPSS Questionnaire (AUA-7): Over the past month.   1)  How often have you had a sensation of not emptying your bladder completely after you finish urinating?  0 - Not at all  2)  How often have you had to urinate again less than two hours after you finished urinating? 0 - Not at all  3)  How often have you found you stopped and started again several times when you urinated?  0 - Not at all  4) How difficult have you found it to postpone urination?  0 - Not at all  5) How often have you had a weak urinary stream?  0 - Not at all  6) How often have you had to push or strain to begin urination?  0 - Not at all  7) How many times did you most typically get up to urinate from the time you went to bed until the time you got up in the morning?  3 - 3 times  Total score:  0-7 mildly symptomatic   8-19 moderately symptomatic   20-35 severely symptomatic   IPSS: 3 QoL: 1  (below copied from [facility] records):  PSA:   By history he has a elevated PSA. He presented to our office in [place] in July, 2017 with PSA of 20. he has been followed by Dr. Exie Parody and has not had this checked recently because of his age.   1.21.2020: He denies any significant bony pain. He is has some weight loss, about 10 lb over the past year 2.    CC: AUA Questions Scoring.   BPH:  The patient complains of lower urinary  tract symptom(s) that include nocturia. The patient states his most bothersome symptom(s) are the following: nocturia. His symptoms have been worse over the last year.   He is on dual medical therapy, finasteride and tamsulosin. He has no significant daytime urinary issues. He wakes up 3 times a night to urinate.  Erectile Dysfunction:  Charles Lester is a 86 year-old male established patient who is here for erectile dysfunction.  He bought a new vacuum device last year that works adequately.  Past Medical History:  Diagnosis Date  . Aortic stenosis    Mild to moderate 2017  . Arthritis   . CAD (coronary artery disease)   . Essential hypertension   . Hyperlipidemia   . Ischemic heart disease    Based on Myoview 2010 revealing inferolateral scar but no active ischemia  . PAD (peripheral artery disease) ([place])   . Type 2 diabetes mellitus (HCC)     No past surgical history on file.  Home Medications:  Allergies as of 03/25/2019   No Known Allergies     Medication List       Accurate as of March 25, 2019  1:20 PM. If you have any questions, ask your nurse or  doctor.        amLODipine 5 MG tablet Commonly known as: NORVASC Take 5 mg by mouth daily.   aspirin EC 81 MG tablet Take 81 mg by mouth daily.   atorvastatin 20 MG tablet Commonly known as: LIPITOR Take 20 mg by mouth daily.   clopidogrel 75 MG tablet Commonly known as: PLAVIX Take 75 mg by mouth daily.   ferrous sulfate 325 (65 FE) MG tablet Take 325 mg by mouth daily with breakfast.   finasteride 5 MG tablet Commonly known as: PROSCAR Take 1 tablet by mouth daily.   lisinopril 40 MG tablet Commonly known as: ZESTRIL Take 40 mg by mouth daily.   metFORMIN 500 MG 24 hr tablet Commonly known as: GLUCOPHAGE-XR Take 250 tablets by mouth daily.   metoprolol tartrate 25 MG tablet Commonly known as: LOPRESSOR Take 25 mg by mouth 2 (two) times daily. What changed: Another medication with the same name  was removed. Continue taking this medication, and follow the directions you see here. Changed by: Jorja Loa, MD   niacin 500 MG CR tablet Commonly known as: NIASPAN Take 500 mg by mouth at bedtime. What changed: Another medication with the same name was removed. Continue taking this medication, and follow the directions you see here. Changed by: Jorja Loa, MD   tamsulosin 0.4 MG Caps capsule Commonly known as: FLOMAX Take 1 capsule by mouth daily.   vitamin C 500 MG tablet Commonly known as: ASCORBIC ACID Take 500 mg by mouth daily.       Allergies: No Known Allergies  Family History  Problem Relation Age of Onset  . Hypertension Father   . Hypertension Sister   . Hypertension Brother   . Coronary artery disease Neg Hx     Social History:  reports that he quit smoking about 14 years ago. He has never used smokeless tobacco. He reports that he does not drink alcohol or use drugs.  ROS: A complete review of systems was performed.  All systems are negative except for pertinent findings as noted.  Physical Exam:  Vital signs in last 24 hours: BP (!) 173/72   Pulse 77   Temp 98.2 F (36.8 C) (Oral)  Constitutional:  Alert and oriented, No acute distress, Occasionally uses a walker for mobility.  Cardiovascular: Regular rate  Respiratory: Normal respiratory effort GI: Abdomen is soft, nontender, nondistended, no abdominal masses. No CVAT. No hernias. Genitourinary: Normal male phallus, testes are descended bilaterally and non-tender and without masses and atrophic, scrotum is normal in appearance without lesions or masses, perineum is normal on inspection. Prostate feels around 50 grams in size, nodularity. Lymphatic: No lymphadenopathy Neurologic: Grossly intact, no focal deficits Psychiatric: Normal mood and affect  I have reviewed prior pt notes  I have reviewed notes from referring/previous physicians  I have reviewed urinalysis results  I  have independently reviewed prior imaging  I have reviewed prior PSA results  I have reviewed prior urine culture  Impression/Assessment:  His recent PSA when corrected for being on finasteride is around 64. He has had no new bony pain, voiding sx's, nor blood per urine. Prostate feels benign on exam with no nodularity. I am still concerned enough with his PSA to where I think it would be appropriate to schedule him for an US/bx seeing as he is still a very vigorous, active man despite his age. He would prefer, however, to forgo this at this time.  Plan:   1. We discussed  TRUSP/bx, its utility, as well as its possible risks and complications. He was assured that the primary reason I am interested in pursuing this is to make sure he is not being put at higher risk for hospitalization for pathologic fractures secondary to metastatic disease. Informed with this, he has elected to forgo this procedure.   2. He would like to just return in 1 yr for follow-up  3. He was given explicit instructions to return should he develop any new bony pain.

## 2019-03-25 NOTE — Progress Notes (Signed)
Urological Symptom Review  Patient is experiencing the following symptoms: Get up at night to urinate   Review of Systems  Gastrointestinal (upper)  : Negative for upper GI symptoms  Gastrointestinal (lower) : Negative for lower GI symptoms  Constitutional : Negative for symptoms  Skin: Negative for skin symptoms  Eyes: Negative for eye symptoms  Ear/Nose/Throat : Negative for Ear/Nose/Throat symptoms  Hematologic/Lymphatic: Negative for Hematologic/Lymphatic symptoms  Cardiovascular : Negative for cardiovascular symptoms  Respiratory : Negative for respiratory symptoms  Endocrine: Negative for endocrine symptoms  Musculoskeletal: Negative for musculoskeletal symptoms  Neurological: negative  Psychologic: Negative for psychiatric symptoms

## 2019-04-28 NOTE — Progress Notes (Signed)
Cardiology Office Note  Date: 04/29/2019   ID: Refael, Bergara 12/25/1924, MRN [id number]  PCP:  [name], MD  Cardiologist:  Rozann Lesches, MD Electrophysiologist:  None   Chief Complaint: Follow-up HTN, PAD, HLD, aortic valve disease, ischemic heart disease, carotid artery disease  History of Present Illness: Charles Lester is a 86 y.o. male with a history of aortic valve disease, ischemic heart disease based on prior noninvasive imaging, mixed HLD, HTN, PAD, DM type II, carotid artery disease.  Last office visit 02/28/2018 with Dr. Domenic Polite.  Patient reported no major changes in his health at that time.  He was still living at home performing his own ADLs.  He did not report any anginal symptoms, palpitations or syncope.  His most recent echo showed EF of 55 to 60% with mild DD, AS with mean gradient of 11 mmHg and mild to moderate AR.  He was compliant with his medication.  He denies any recent acute illnesses, hospitalizations, pending surgeries, or travels.  Denies any progressive anginal or exertional symptoms.  States she is fairly active on a daily basis.  Denies any palpitations or arrhythmias, orthostatic symptoms, stroke or TIA-like symptoms, bleeding issues.  Denies any DVT or PE-like symptoms, or lower extremity edema.  He does have mild claudication-like symptoms when walking and relieved at rest.  States the symptoms are not severe but he does have to stop occasionally to rest.  States he walks a few times a week at [facility] without any significant claudication-like symptoms.  Past Medical History:  Diagnosis Date  . Aortic stenosis    Mild to moderate 2017  . Arthritis   . CAD (coronary artery disease)   . Essential hypertension   . Hyperlipidemia   . Ischemic heart disease    Based on Myoview 2010 revealing inferolateral scar but no active ischemia  . PAD (peripheral artery disease) ([place])   . Type 2 diabetes mellitus ([place])     Past Surgical History:    Procedure Laterality Date  . NO PAST SURGERIES      Current Outpatient Medications  Medication Sig Dispense Refill  . aspirin EC 81 MG tablet Take 81 mg by mouth daily.    Marland Kitchen atorvastatin (LIPITOR) 20 MG tablet Take 20 mg by mouth daily.    . clopidogrel (PLAVIX) 75 MG tablet Take 75 mg by mouth daily.      . ferrous sulfate 325 (65 FE) MG tablet Take 325 mg by mouth daily with breakfast.    . finasteride (PROSCAR) 5 MG tablet Take 1 tablet by mouth daily.    Marland Kitchen lisinopril (PRINIVIL,ZESTRIL) 40 MG tablet Take 40 mg by mouth daily.      . metoprolol tartrate (LOPRESSOR) 25 MG tablet Take 25 mg by mouth 2 (two) times daily.    . niacin (NIASPAN) 1000 MG CR tablet Take 1,000 mg by mouth daily.    . tamsulosin (FLOMAX) 0.4 MG CAPS Take 1 capsule by mouth in the morning and at bedtime.     . vitamin C (ASCORBIC ACID) 500 MG tablet Take 500 mg by mouth daily.    Marland Kitchen amLODipine (NORVASC) 2.5 MG tablet Take 1 tablet (2.5 mg total) by mouth daily. 90 tablet 1   No current facility-administered medications for this visit.   Allergies:  Patient has no known allergies.   Social History: The patient  reports that he quit smoking about 14 years ago. He has never used smokeless tobacco. He reports that he  does not drink alcohol or use drugs.   Family History: The patient's family history includes Hypertension in his brother, father, and sister.   ROS:  Please see the history of present illness. Otherwise, complete review of systems is positive for none.  All other systems are reviewed and negative.   Physical Exam: VS:  BP (!) 164/60   Pulse (!) 56   Ht 5\' 7"  (1.702 m)   Wt 163 lb 12.8 oz (74.3 kg)   SpO2 98%   BMI 25.65 kg/m , BMI Body mass index is 25.65 kg/m.  Wt Readings from Last 3 Encounters:  04/29/19 163 lb 12.8 oz (74.3 kg)  02/28/18 153 lb 9.6 oz (69.7 kg)  02/28/17 154 lb (69.9 kg)    General: Patient appears comfortable at rest. Lungs: Clear to auscultation, nonlabored  breathing at rest. Cardiac: Regular rate and rhythm, no S3 mild 2 of 6 systolic murmur best heard at right upper sternal border, no pericardial rub. Extremities: No pitting edema, distal pulses 2+. Skin: Warm and dry. Musculoskeletal: No kyphosis. Neuropsychiatric: Alert and oriented x3, affect grossly appropriate.  ECG: 04[birth year]1929.  EKG shows normal sinus rhythm with first-degree AV block,, negative T waves possible anterior lateral ischemia, heart rate of 62  Recent Labwork:  Recent lab work at PCP office 03/28/2019 hemoglobin 10.4, hematocrit 30.2, platelets 129, glucose 144, creatinine 1.03, GFR 63, AST 28, ALT 14, total cholesterol 133, triglycerides 50, HDL 65, LDL 57 No results found for requested labs within last 8760 hours.     Component Value Date/Time   CHOL 113 10/27/2008 0000   TRIG 87 10/27/2008 0000   HDL 40 10/27/2008 0000   LDLCALC 56 10/27/2008 0000    Other Studies Reviewed Today:  Echocardiogram 03/07/2017: Study Conclusions  - Left ventricle: The cavity size was normal. Wall thickness was normal. Systolic function was normal. The estimated ejection fraction was in the range of 55% to 60%. Wall motion was normal; there were no regional wall motion abnormalities. Doppler parameters are consistent with abnormal left ventricular relaxation (grade 1 diastolic dysfunction). - Aortic valve: Mildly calcified annulus. Mildly thickened leaflets. There was mild stenosis. There was mild to moderate regurgitation. Mean gradient (S): 11 mm Hg. Valve area (VTI): 1.51 cm^2. Valve area (Vmax): 1.57 cm^2. Valve area (Vmean): 1.52 cm^2. - Mitral valve: Mildly calcified annulus. Mildly thickened leaflets. - Left atrium: The atrium was moderately dilated. - Technically adequate study.  Lower extremity arterial Doppler February 28, 2017 Final Interpretation:  Right: Resting right ankle-brachial index indicates moderate right lower extremity arterial disease.  The right toe-brachial index is abnormal. PPG tracings appear dampened.  Left: Resting left ankle-brachial index indicates moderate left lower  extremity arterial disease. The left toe-brachial index is abnormal. PPG tracings appear dampened  Carotid artery Doppler studies 11/17/2015     Assessment and Plan:  1. Nonrheumatic aortic valve stenosis   2. Coronary artery disease involving native coronary artery of native heart without angina pectoris   3. Mixed hyperlipidemia   4. Bilateral carotid artery stenosis   5. Peripheral vascular disease (HCC)   6. Carotid stenosis, bilateral   7. Essential hypertension    1. Nonrheumatic aortic valve stenosis Echocardiogram in 2019 showed mild stenosis. There was mild to moderateregurgitation.  Patient denies any significant dyspnea, anginal-like symptoms, or syncopal/presyncopal symptoms.  He is able to perform his normal ADLs without issue.  2. Coronary artery disease involving native coronary artery of native heart without angina pectoris Ischemic heart disease based  on prior imaging.  He denies any progressive anginal or exertional symptoms.  He is fairly active on a daily basis for his age.  He walks several times a week at [facility] without any significant symptoms other than mild claudication for which he has moderate lower extremity PAD.  Continue aspirin 81 mg, Plavix 75 mg.  3. Mixed hyperlipidemia Recent LDL at PCP office was 57.  Continue atorvastatin 20 mg daily.   4. Bilateral carotid artery stenosis Last carotid artery Doppler study in 2017 showed 1 to 39% bilateral ICA stenosis.  Patient has a left carotid bruit.  He is asymptomatic.  Repeat carotid artery duplex study.  5. Peripheral vascular disease ([place]) Patient has moderate bilateral lower extremity claudication symptoms on previous lower extremity arterial study in 2019.  But not lifestyle limiting.  He states he has to pace himself.  States the pain is relieved at rest.  He  states he walks a few times a week at [facility] without any significant claudication symptoms.  6.  Hypertension Patient is apparently not taking his amlodipine.  Clinic nurse states she called the pharmacy and states he has not filled this medication since 2018.  We will reorder amlodipine at 5 mg.  Patient's blood pressure was 164/60 today.  Patient is unaware of what type of antihypertensive medication he is taking. Continue Lisinopril 40 mg. Refill Amlodipine and tell patient to start taking it.  Medication Adjustments/Labs and Tests Ordered: Current medicines are reviewed at length with the patient today.  Concerns regarding medicines are outlined above.   Disposition: Follow-up with Dr Domenic Polite or APP 1 year  Signed, Levell July, NP 04/29/2019 12:06 PM    [facility] [place], [place], [place] [address] Phone: [phone]; Fax: [phone]

## 2019-04-29 ENCOUNTER — Ambulatory Visit (INDEPENDENT_AMBULATORY_CARE_PROVIDER_SITE_OTHER): Payer: Medicare HMO | Admitting: Family Medicine

## 2019-04-29 ENCOUNTER — Other Ambulatory Visit: Payer: Self-pay

## 2019-04-29 ENCOUNTER — Telehealth: Payer: Self-pay | Admitting: *Deleted

## 2019-04-29 ENCOUNTER — Encounter: Payer: Self-pay | Admitting: Family Medicine

## 2019-04-29 VITALS — BP 164/60 | HR 56 | Ht 67.0 in | Wt 163.8 lb

## 2019-04-29 DIAGNOSIS — E782 Mixed hyperlipidemia: Secondary | ICD-10-CM

## 2019-04-29 DIAGNOSIS — I2510 Atherosclerotic heart disease of native coronary artery without angina pectoris: Secondary | ICD-10-CM

## 2019-04-29 DIAGNOSIS — I10 Essential (primary) hypertension: Secondary | ICD-10-CM

## 2019-04-29 DIAGNOSIS — I739 Peripheral vascular disease, unspecified: Secondary | ICD-10-CM

## 2019-04-29 DIAGNOSIS — I350 Nonrheumatic aortic (valve) stenosis: Secondary | ICD-10-CM | POA: Diagnosis not present

## 2019-04-29 DIAGNOSIS — I6523 Occlusion and stenosis of bilateral carotid arteries: Secondary | ICD-10-CM | POA: Diagnosis not present

## 2019-04-29 MED ORDER — AMLODIPINE BESYLATE 2.5 MG PO TABS: 2.5000 mg | ORAL_TABLET | Freq: Every day | ORAL | 1 refills | Status: DC

## 2019-04-29 NOTE — Patient Instructions (Addendum)
Medication Instructions:   Your physician has recommended you make the following change in your medication:   Restart amlodipine 2.5 mg by mouth daily  Continue other medications the same  Labwork:  NONE  Testing/Procedures: Your physician has requested that you have a carotid duplex. This test is an ultrasound of the carotid arteries in your neck. It looks at blood flow through these arteries that supply the brain with blood. Allow one hour for this exam. There are no restrictions or special instructions.  Follow-Up:  Your physician recommends that you schedule a follow-up appointment in: 1 year (office). You will receive a reminder letter in the mail in about 10 months reminding you to call and schedule your appointment. If you don't receive this letter, please contact our office.  Any Other Special Instructions Will Be Listed Below (If Applicable).  If you need a refill on your cardiac medications before your next appointment, please call your pharmacy.

## 2019-04-29 NOTE — Telephone Encounter (Signed)
Called back to update medlist with vitamin d3 400 iu

## 2019-05-13 ENCOUNTER — Ambulatory Visit: Payer: Medicare HMO | Admitting: Urology

## 2019-05-20 ENCOUNTER — Ambulatory Visit: Payer: Medicare HMO | Admitting: Urology

## 2019-05-27 ENCOUNTER — Other Ambulatory Visit: Payer: Self-pay | Admitting: Family Medicine

## 2019-05-27 ENCOUNTER — Ambulatory Visit (INDEPENDENT_AMBULATORY_CARE_PROVIDER_SITE_OTHER): Payer: Medicare HMO

## 2019-05-27 ENCOUNTER — Other Ambulatory Visit: Payer: Self-pay

## 2019-05-27 DIAGNOSIS — I6523 Occlusion and stenosis of bilateral carotid arteries: Secondary | ICD-10-CM | POA: Diagnosis not present

## 2019-05-29 ENCOUNTER — Telehealth: Payer: Self-pay | Admitting: *Deleted

## 2019-05-29 DIAGNOSIS — I6523 Occlusion and stenosis of bilateral carotid arteries: Secondary | ICD-10-CM

## 2019-05-29 NOTE — Telephone Encounter (Signed)
-----   Message from Verta Ellen., NP sent at 05/27/2019  2:03 PM EDT ----- Please call the patient  and tell him that his carotid artery study looked good . He has mild narrowing in both his internal carotid arteries. Let him know this is okay. He does have some greater than medium narrowing in his left common carotid artery. Tell him we will need to keep an eye on this by doing yearly studies like this to make sure it does not get worse.

## 2019-05-29 NOTE — Telephone Encounter (Signed)
Patient informed. Copy sent to PCP °

## 2019-10-20 ENCOUNTER — Other Ambulatory Visit: Payer: Self-pay | Admitting: Family Medicine

## 2020-03-30 ENCOUNTER — Ambulatory Visit: Payer: Medicare HMO | Admitting: Urology

## 2020-04-14 ENCOUNTER — Other Ambulatory Visit: Payer: Self-pay | Admitting: Family Medicine

## 2020-05-03 NOTE — Progress Notes (Signed)
History of Present Illness:  4.5.2022: Over the past year he denies any real problems with bony pain.  Stable weight.  Appetite is good.  He is using a walker and has for some time.  He has no specific complaints about voiding.  He has not had a PSA that I can see since last year.  He does state that he was told in the past that he had prostate cancer.  He does not remember a biopsy, and certainly does not remember surgery or radiotherapy.  He has not even wanted radiographic imaging to rule out metastatic disease.   2.23.2021: Referred today for an elevated PSA measured by his PCP at his recent visit -- 32.6 (65.2 w/ finasteride correction) on 2.6.2021. He continues on finasteride and tamsulosin and denies any significant voiding sx's.   PSA:   By history he has a elevated PSA. He presented to our office in [place] in July, 2017 with PSA of 20. he has been followed by Dr. Exie Parody and has not had this checked recently because of his age.      BPH:  He is on dual medical therapy.  IPSS 7  IPSS Questionnaire (AUA-7): Over the past month.   1)  How often have you had a sensation of not emptying your bladder completely after you finish urinating?  0  2)  How often have you had to urinate again less than two hours after you finished urinating? 0  3)  How often have you found you stopped and started again several times when you urinated?  4  4) How difficult have you found it to postpone urination?  0  5) How often have you had a weak urinary stream?  0  6) How often have you had to push or strain to begin urination?  0  7) How many times did you most typically get up to urinate from the time you went to bed until the time you got up in the morning?  3  Total score:  0-7 mildly symptomatic   8-19 moderately symptomatic   20-35 severely symptomatic     Erectile Dysfunction:   He bought a vacuum device  that works adequately.  He is not currently sexually active.   Past  Medical History:  Diagnosis Date  . Aortic stenosis    Mild to moderate 2017  . Arthritis   . CAD (coronary artery disease)   . Essential hypertension   . Hyperlipidemia   . Ischemic heart disease    Based on Myoview 2010 revealing inferolateral scar but no active ischemia  . PAD (peripheral artery disease) ([place])   . Type 2 diabetes mellitus ([place])     Past Surgical History:  Procedure Laterality Date  . NO PAST SURGERIES      Home Medications:  Allergies as of 05/04/2020   No Known Allergies     Medication List       Accurate as of May 03, 2020  6:30 PM. If you have any questions, ask your nurse or doctor.        amLODipine 2.5 MG tablet Commonly known as: NORVASC Take 1 tablet by mouth once daily   aspirin EC 81 MG tablet Take 81 mg by mouth daily.   atorvastatin 20 MG tablet Commonly known as: LIPITOR Take 20 mg by mouth daily.   clopidogrel 75 MG tablet Commonly known as: PLAVIX Take 75 mg by mouth daily.   ferrous sulfate 325 (65 FE) MG tablet Take 325  mg by mouth daily with breakfast.   finasteride 5 MG tablet Commonly known as: PROSCAR Take 1 tablet by mouth daily.   lisinopril 40 MG tablet Commonly known as: ZESTRIL Take 40 mg by mouth daily.   metoprolol tartrate 25 MG tablet Commonly known as: LOPRESSOR Take 25 mg by mouth 2 (two) times daily.   niacin 1000 MG CR tablet Commonly known as: NIASPAN Take 1,000 mg by mouth daily.   tamsulosin 0.4 MG Caps capsule Commonly known as: FLOMAX Take 1 capsule by mouth in the morning and at bedtime.   vitamin C 500 MG tablet Commonly known as: ASCORBIC ACID Take 500 mg by mouth daily.   Vitamin D3 10 MCG (400 UNIT) Caps Take 1 capsule by mouth daily.       Allergies: No Known Allergies  Family History  Problem Relation Age of Onset  . Hypertension Father   . Hypertension Sister   . Hypertension Brother   . Coronary artery disease Neg Hx     Social History:  reports that he quit  smoking about 15 years ago. He has never used smokeless tobacco. He reports that he does not drink alcohol and does not use drugs.  ROS: A complete review of systems was performed.  All systems are negative except for pertinent findings as noted.  Physical Exam:  Vital signs in last 24 hours: There were no vitals taken for this visit. Constitutional:  Alert and oriented, No acute distress.  He has a walker. Cardiovascular: Regular rate  Respiratory: Normal respiratory effort GI: Abdomen is soft, nontender, nondistended, no abdominal masses. No CVAT.  Genitourinary: Normal male phallus, testes are descended bilaterally and non-tender and without masses, scrotum is normal in appearance without lesions or masses, perineum is normal on inspection.  Prostate is 20 to 30 g with minimal nodularity/asymmetry. Lymphatic: No lymphadenopathy Neurologic: Grossly intact, no focal deficits Psychiatric: Normal mood and affect  I have reviewed prior pt notes  I have reviewed notes from referring/previous physicians  I have reviewed urinalysis results  I have reviewed prior PSA results   Impression/Assessment:  History of elevated PSA, the patient has been told in the past that he had prostate cancer but I know of no biopsy, prior imaging or treatment.  The patient has pretty good functional status for 87 year old.  He has been hesitant to have any further evaluation.  Last PSA was a year ago, essentially 46 with finasteride correction  Plan:  I will have PSA checked today as well as BUN and creatinine  If significant elevation of his PSA from the corrected value of 66 last year, I will push to have CT and bone scan performed just to rule out significant metastatic disease which could lead to morbid events

## 2020-05-04 ENCOUNTER — Encounter: Payer: Self-pay | Admitting: Urology

## 2020-05-04 ENCOUNTER — Other Ambulatory Visit: Payer: Self-pay

## 2020-05-04 ENCOUNTER — Ambulatory Visit (INDEPENDENT_AMBULATORY_CARE_PROVIDER_SITE_OTHER): Payer: Medicare HMO | Admitting: Urology

## 2020-05-04 VITALS — BP 164/63 | HR 79 | Temp 97.3°F | Ht 67.0 in | Wt 155.0 lb

## 2020-05-04 DIAGNOSIS — R9721 Rising PSA following treatment for malignant neoplasm of prostate: Secondary | ICD-10-CM

## 2020-05-04 DIAGNOSIS — C61 Malignant neoplasm of prostate: Secondary | ICD-10-CM

## 2020-05-04 DIAGNOSIS — R972 Elevated prostate specific antigen [PSA]: Secondary | ICD-10-CM | POA: Diagnosis not present

## 2020-05-04 LAB — MICROSCOPIC EXAMINATION
Bacteria, UA: NONE SEEN
Epithelial Cells (non renal): NONE SEEN /hpf (ref 0–10)
Renal Epithel, UA: NONE SEEN /hpf
WBC, UA: NONE SEEN /hpf (ref 0–5)

## 2020-05-04 LAB — URINALYSIS, ROUTINE W REFLEX MICROSCOPIC
Bilirubin, UA: NEGATIVE
Glucose, UA: NEGATIVE
Ketones, UA: NEGATIVE
Leukocytes,UA: NEGATIVE
Nitrite, UA: NEGATIVE
Protein,UA: NEGATIVE
Specific Gravity, UA: 1.02 (ref 1.005–1.030)
Urobilinogen, Ur: 1 mg/dL (ref 0.2–1.0)
pH, UA: 6 (ref 5.0–7.5)

## 2020-05-04 NOTE — Progress Notes (Signed)
Urological Symptom Review  Patient is experiencing the following symptoms: Get up at night to urinate Stream starts and stops   Review of Systems  Gastrointestinal (upper)  : Negative for upper GI symptoms  Gastrointestinal (lower) : Constipation  Constitutional : Negative for symptoms  Skin: Itching  Eyes: Negative for eye symptoms  Ear/Nose/Throat : Negative for Ear/Nose/Throat symptoms  Hematologic/Lymphatic: Negative for Hematologic/Lymphatic symptoms  Cardiovascular : Negative for cardiovascular symptoms  Respiratory : Negative for respiratory symptoms  Endocrine: Negative for endocrine symptoms  Musculoskeletal: Negative for musculoskeletal symptoms  Neurological: Negative for neurological symptoms  Psychologic: Negative for psychiatric symptoms

## 2020-05-05 LAB — PSA: Prostate Specific Ag, Serum: 31.2 ng/mL — ABNORMAL HIGH (ref 0.0–4.0)

## 2020-05-13 ENCOUNTER — Telehealth: Payer: Self-pay

## 2020-05-13 NOTE — Telephone Encounter (Signed)
-----   Message from Franchot Gallo, MD sent at 05/11/2020  8:35 AM EDT ----- Notify pt--psa stable for now--we don't need to do further testing @ present. ----- Message ----- From: Valentina Lucks, LPN Sent: 08/08/4799   9:56 AM EDT To: Franchot Gallo, MD  Pls review.

## 2020-05-13 NOTE — Telephone Encounter (Signed)
Notified pt of PSA results per Dr. Jeffie Pollock.

## 2020-06-02 ENCOUNTER — Ambulatory Visit (INDEPENDENT_AMBULATORY_CARE_PROVIDER_SITE_OTHER): Payer: Medicare HMO

## 2020-06-02 DIAGNOSIS — I6523 Occlusion and stenosis of bilateral carotid arteries: Secondary | ICD-10-CM

## 2020-06-04 ENCOUNTER — Telehealth: Payer: Self-pay | Admitting: *Deleted

## 2020-06-04 NOTE — Telephone Encounter (Signed)
Laurine Blazer, LPN  4/0/9811 9:14 PM EDT Back to Top     Notified, copy to pcp.    Verta Ellen., NP  06/03/2020 11:43 PM EDT      Please call the patient and let him know the carotid artery study showed he has mild narrowing in both internal carotid arteries. The left common carotid artery has some medium narrowing that is causing some limitation of blood flow. Let him know we will do yearly carotid studies to keep an eye on this.

## 2020-07-12 ENCOUNTER — Other Ambulatory Visit: Payer: Self-pay | Admitting: Cardiology

## 2020-09-12 ENCOUNTER — Other Ambulatory Visit: Payer: Self-pay | Admitting: Cardiology

## 2020-10-11 ENCOUNTER — Other Ambulatory Visit: Payer: Self-pay | Admitting: Cardiology

## 2020-10-13 ENCOUNTER — Other Ambulatory Visit: Payer: Self-pay | Admitting: Cardiology

## 2020-10-21 NOTE — Progress Notes (Signed)
Cardiology Office Note  Date: 10/22/2020   ID: Joan, Avetisyan 03/11/1924, MRN [id number]  PCP:  [name], MD  Cardiologist:  Rozann Lesches, MD Electrophysiologist:  None   Chief Complaint: 1 year follow-up  History of Present Illness: TERRAN KLINKE is a 87 y.o. male with a history of CAD, aortic stenosis, HTN, HLD, ischemic heart disease, PAD, DM2, carotid artery disease  He was  seen by me last year on April 29, 2019.  His most recent echo showed EF of 55 to 60% with mild aortic stenosis with moderate aortic regurgitation on 03/07/2017.  He was compliant with his medication therapy.  He denied any anginal or exertional symptoms, palpitations or arrhythmias, orthostatic symptoms, CVA or TIA-like symptoms, DVT or PE-like symptoms, bleeding issues, lower extremity edema.  Complained of some mild claudication-like symptoms 1 walking.  Stated the symptoms were not severe but had occasional stop to rest.  He was walking a few times at [facility] per week without any significant claudication-like symptoms   He is here for 1 year follow-up.  He denies any changes since last visit.  He had an carotid study in the interim since last visit which showed mild 1 to 39% bilateral ICA stenosis.  He did any anginal symptoms, exertional symptoms he SOB or DOE.  He continues to walk several times a week for exercise.  He states he still does his own housework and yard work.  He is still driving.  He denies any orthostatic symptoms, CVA or TIA-like symptoms, PND, orthopnea, bleeding, claudication, DVT or PE-like symptoms, or lower extremity edema.  Blood pressures well controlled.  He denies any claudication-like symptoms.  He is using a walker to ambulate with today.  EKG today shows sinus rhythm first-degree AV block rate of 61, LVH with repolarization abnormality.  Past Medical History:  Diagnosis Date   Aortic stenosis    Mild to moderate 2017   Arthritis    CAD (coronary artery disease)     Essential hypertension    Hyperlipidemia    Ischemic heart disease    Based on Myoview 2010 revealing inferolateral scar but no active ischemia   PAD (peripheral artery disease) (HCC)    Type 2 diabetes mellitus (HCC)     Past Surgical History:  Procedure Laterality Date   NO PAST SURGERIES      Current Outpatient Medications  Medication Sig Dispense Refill   Cholecalciferol (VITAMIN D3) 10 MCG (400 UNIT) CAPS Take 1 capsule by mouth daily.     ferrous sulfate 325 (65 FE) MG tablet Take 325 mg by mouth daily with breakfast.     finasteride (PROSCAR) 5 MG tablet Take 1 tablet by mouth daily.     niacin (NIASPAN) 1000 MG CR tablet Take 1,000 mg by mouth daily.     Omega-3 Fatty Acids (FISH OIL) 1000 MG CAPS Take by mouth.     tamsulosin (FLOMAX) 0.4 MG CAPS Take 1 capsule by mouth in the morning and at bedtime.     vitamin C (ASCORBIC ACID) 500 MG tablet Take 500 mg by mouth daily.     amLODipine (NORVASC) 2.5 MG tablet Take 1 tablet (2.5 mg total) by mouth daily. 90 tablet 3   aspirin EC 81 MG tablet Take 1 tablet (81 mg total) by mouth daily. 30 tablet 11   atorvastatin (LIPITOR) 20 MG tablet Take 1 tablet (20 mg total) by mouth daily. 90 tablet 3   clopidogrel (PLAVIX) 75 MG tablet Take  1 tablet (75 mg total) by mouth daily. 90 tablet 3   lisinopril (ZESTRIL) 40 MG tablet Take 1 tablet (40 mg total) by mouth daily. 90 tablet 3   metoprolol tartrate (LOPRESSOR) 25 MG tablet Take 1 tablet (25 mg total) by mouth 2 (two) times daily. 180 tablet 3   No current facility-administered medications for this visit.   Allergies:  Patient has no known allergies.   Social History: The patient  reports that he quit smoking about 15 years ago. He has never used smokeless tobacco. He reports that he does not drink alcohol and does not use drugs.   Family History: The patient's family history includes Hypertension in his brother, father, and sister.   ROS:  Please see the history of present  illness. Otherwise, complete review of systems is positive for none.  All other systems are reviewed and negative.   Physical Exam: VS:  BP (!) 120/52   Pulse 60   Ht 5\' 7"  (1.702 m)   Wt 144 lb (65.3 kg)   SpO2 100%   BMI 22.55 kg/m , BMI Body mass index is 22.55 kg/m.  Wt Readings from Last 3 Encounters:  10/22/20 144 lb (65.3 kg)  05/04/20 155 lb (70.3 kg)  04/29/19 163 lb 12.8 oz (74.3 kg)    General: Patient appears comfortable at rest. Neck: Supple, no elevated JVP or carotid bruits, no thyromegaly. Lungs: Clear to auscultation, nonlabored breathing at rest. Cardiac: Regular rate and rhythm, no S3 or significant systolic murmur, no pericardial rub. Extremities: No pitting edema, distal pulses 2+. Skin: Warm and dry. Musculoskeletal: No kyphosis. Neuropsychiatric: Alert and oriented x3, affect grossly appropriate.  ECG: 10/22/2020 sinus rhythm first-degree AV block rate of 61, LVH with repolarization abnormality.  Recent Labwork: No results found for requested labs within last 8760 hours.     Component Value Date/Time   CHOL 113 10/27/2008 0000   TRIG 87 10/27/2008 0000   HDL 40 10/27/2008 0000   LDLCALC 56 10/27/2008 0000    Other Studies Reviewed Today:  Carotid artery duplex study 06/02/2020 Right Carotid: Velocities in the right ICA are consistent with a 1-39% stenosis. Non-hemodynamically significant plaque <50% noted in the CCA. The ECA appears >50% stenosed. Left Carotid: Velocities in the left ICA are consistent with a 1-39% stenosis. Hemodynamically significant plaque >50% visualized in the CCA. Vertebrals: Bilateral vertebral arteries demonstrate antegrade flow. Subclavians:Normal flow hemodynamics were seen in bilateral subclavian arteries   Echocardiogram 03/07/2017 Study Conclusions  - Left ventricle: The cavity size was normal. Wall thickness was    normal. Systolic function was normal. The estimated ejection    fraction was in the range of 55%  to 60%. Wall motion was normal;    there were no regional wall motion abnormalities. Doppler    parameters are consistent with abnormal left ventricular    relaxation (grade 1 diastolic dysfunction).  - Aortic valve: Mildly calcified annulus. Mildly thickened    leaflets. There was mild stenosis. There was mild to moderate    regurgitation. Mean gradient (S): 11 mm Hg. Valve area (VTI):    1.51 cm^2. Valve area (Vmax): 1.57 cm^2. Valve area (Vmean): 1.52    cm^2.  - Mitral valve: Mildly calcified annulus. Mildly thickened leaflets    .  - Left atrium: The atrium was moderately dilated.  - Technically adequate study.      Lower extremity arterial Doppler February 28, 2017 Final Interpretation:  Right: Resting right ankle-brachial index indicates moderate right lower  extremity arterial disease. The right toe-brachial index is abnormal. PPG tracings appear dampened.  Left: Resting left ankle-brachial index indicates moderate left lower  extremity arterial disease. The left toe-brachial index is abnormal. PPG tracings appear dampened   Carotid artery Doppler studies 11/17/2015       Assessment and Plan:  1. Nonrheumatic aortic valve stenosis   2. CAD in native artery   3. Mixed hyperlipidemia   4. Bilateral carotid artery stenosis   5. Peripheral vascular disease ([place])   6. Essential hypertension    1. Nonrheumatic aortic valve stenosis Last echocardiogram in 2019 demonstrated mild aortic stenosis and mild to moderate regurgitation.  He denies any DOE, SOB, presyncopal or syncopal episodes.  Denies any anginal symptoms.  We will continue to monitor with subsequent echocardiograms.  2. CAD in native artery Denies any anginal or exertional symptoms.  Continue aspirin 81 mg daily.  Continue Plavix 75 mg daily. Dr. Domenic Polite or APP 1 year   3. Mixed hyperlipidemia Continue Lipitor 20 mg p.o. daily.  4. Bilateral carotid artery stenosis Carotid artery duplex 06/02/2020 demonstrated  bilateral 1 to 39% ICA stenosis.  Continue to monitor  5. Peripheral vascular disease ([place]) ABIs in January 2019 showed right resting ABI moderate right lower extremity arterial disease.  Right TBI abnormal, PPG tracings appear dampened, left resting ankle-brachial index indicated moderate left lower extremity arterial disease with left TBI abnormal.  PPG G.  Tracings appear dampened.  He denies any claudication-like symptoms today.  6. Essential hypertension  Pressure well controlled today at 120/52.  Continue lisinopril 40 mg daily, Toprol 25 mg p.o. twice daily, amlodipine 2.5 mg daily.   Medication Adjustments/Labs and Tests Ordered: Current medicines are reviewed at length with the patient today.  Concerns regarding medicines are outlined above.   Disposition: Follow-up with Domenic Polite or APP 1 year  Signed, Levell July, NP 10/22/2020 8:24 AM    [facility] [place], [place], [place] [address] Phone: [phone]; Fax: [phone]

## 2020-10-22 ENCOUNTER — Ambulatory Visit (INDEPENDENT_AMBULATORY_CARE_PROVIDER_SITE_OTHER): Payer: Medicare Other | Admitting: Family Medicine

## 2020-10-22 ENCOUNTER — Encounter: Payer: Self-pay | Admitting: Family Medicine

## 2020-10-22 VITALS — BP 120/52 | HR 60 | Ht 67.0 in | Wt 144.0 lb

## 2020-10-22 DIAGNOSIS — I2510 Atherosclerotic heart disease of native coronary artery without angina pectoris: Secondary | ICD-10-CM | POA: Diagnosis not present

## 2020-10-22 DIAGNOSIS — I6523 Occlusion and stenosis of bilateral carotid arteries: Secondary | ICD-10-CM

## 2020-10-22 DIAGNOSIS — E782 Mixed hyperlipidemia: Secondary | ICD-10-CM

## 2020-10-22 DIAGNOSIS — I350 Nonrheumatic aortic (valve) stenosis: Secondary | ICD-10-CM

## 2020-10-22 DIAGNOSIS — I10 Essential (primary) hypertension: Secondary | ICD-10-CM

## 2020-10-22 DIAGNOSIS — I739 Peripheral vascular disease, unspecified: Secondary | ICD-10-CM

## 2020-10-22 MED ORDER — METOPROLOL TARTRATE 25 MG PO TABS
25.0000 mg | ORAL_TABLET | Freq: Two times a day (BID) | ORAL | 3 refills | Status: DC
Start: 2020-10-22 — End: 2021-03-07

## 2020-10-22 MED ORDER — ATORVASTATIN CALCIUM 20 MG PO TABS: 20.0000 mg | ORAL_TABLET | Freq: Every day | ORAL | 3 refills | Status: DC

## 2020-10-22 MED ORDER — AMLODIPINE BESYLATE 2.5 MG PO TABS: 2.5000 mg | ORAL_TABLET | Freq: Every day | ORAL | 3 refills | Status: DC

## 2020-10-22 MED ORDER — LISINOPRIL 40 MG PO TABS: 40.0000 mg | ORAL_TABLET | Freq: Every day | ORAL | 3 refills | Status: DC

## 2020-10-22 MED ORDER — CLOPIDOGREL BISULFATE 75 MG PO TABS: 75.0000 mg | ORAL_TABLET | Freq: Every day | ORAL | 3 refills | Status: DC

## 2020-10-22 MED ORDER — ASPIRIN EC 81 MG PO TBEC: 81.0000 mg | DELAYED_RELEASE_TABLET | Freq: Every day | ORAL | 11 refills | Status: DC

## 2020-10-22 NOTE — Patient Instructions (Signed)
Medication Instructions:  Your physician recommends that you continue on your current medications as directed. Please refer to the Current Medication list given to you today.  *If you need a refill on your cardiac medications before your next appointment, please call your pharmacy*   Lab Work: NONE   If you have labs (blood work) drawn today and your tests are completely normal, you will receive your results only by: MyChart Message (if you have MyChart) OR A paper copy in the mail If you have any lab test that is abnormal or we need to change your treatment, we will call you to review the results.   Testing/Procedures: NONE    Follow-Up: At [facility], you and your health needs are our priority.  As part of our continuing mission to provide you with exceptional heart care, we have created designated Provider Care Teams.  These Care Teams include your primary Cardiologist (physician) and Advanced Practice Providers (APPs -  Physician Assistants and Nurse Practitioners) who all work together to provide you with the care you need, when you need it.  We recommend signing up for the patient portal called "MyChart".  Sign up information is provided on this After Visit Summary.  MyChart is used to connect with patients for Virtual Visits (Telemedicine).  Patients are able to view lab/test results, encounter notes, upcoming appointments, etc.  Non-urgent messages can be sent to your provider as well.   To learn more about what you can do with MyChart, go to [email or web address].    Your next appointment:   1 year(s)  The format for your next appointment:   In Person  Provider:   Samuel McDowell, MD   Other Instructions Thank you for choosing [facility]!    

## 2020-11-02 ENCOUNTER — Ambulatory Visit: Payer: Medicare HMO | Admitting: Urology

## 2020-11-02 DIAGNOSIS — R972 Elevated prostate specific antigen [PSA]: Secondary | ICD-10-CM

## 2020-11-02 DIAGNOSIS — C61 Malignant neoplasm of prostate: Secondary | ICD-10-CM

## 2021-03-05 ENCOUNTER — Inpatient Hospital Stay (HOSPITAL_COMMUNITY)
Admission: EM | Admit: 2021-03-05 | Discharge: 2021-03-07 | DRG: 291 | Disposition: A | Discharge status: Home Health | Payer: Medicare Other | Attending: Family Medicine | Admitting: Family Medicine

## 2021-03-05 ENCOUNTER — Emergency Department (HOSPITAL_COMMUNITY): Payer: Medicare Other

## 2021-03-05 ENCOUNTER — Encounter (HOSPITAL_COMMUNITY): Payer: Self-pay | Admitting: *Deleted

## 2021-03-05 DIAGNOSIS — I6529 Occlusion and stenosis of unspecified carotid artery: Secondary | ICD-10-CM | POA: Diagnosis present

## 2021-03-05 DIAGNOSIS — E119 Type 2 diabetes mellitus without complications: Secondary | ICD-10-CM | POA: Diagnosis present

## 2021-03-05 DIAGNOSIS — I5031 Acute diastolic (congestive) heart failure: Secondary | ICD-10-CM | POA: Diagnosis present

## 2021-03-05 DIAGNOSIS — I10 Essential (primary) hypertension: Secondary | ICD-10-CM | POA: Diagnosis present

## 2021-03-05 DIAGNOSIS — E785 Hyperlipidemia, unspecified: Secondary | ICD-10-CM | POA: Diagnosis present

## 2021-03-05 DIAGNOSIS — I083 Combined rheumatic disorders of mitral, aortic and tricuspid valves: Secondary | ICD-10-CM | POA: Diagnosis present

## 2021-03-05 DIAGNOSIS — J90 Pleural effusion, not elsewhere classified: Secondary | ICD-10-CM

## 2021-03-05 DIAGNOSIS — I739 Peripheral vascular disease, unspecified: Secondary | ICD-10-CM | POA: Diagnosis present

## 2021-03-05 DIAGNOSIS — I509 Heart failure, unspecified: Secondary | ICD-10-CM | POA: Diagnosis not present

## 2021-03-05 DIAGNOSIS — I2510 Atherosclerotic heart disease of native coronary artery without angina pectoris: Secondary | ICD-10-CM | POA: Diagnosis present

## 2021-03-05 DIAGNOSIS — R0602 Shortness of breath: Secondary | ICD-10-CM

## 2021-03-05 DIAGNOSIS — I110 Hypertensive heart disease with heart failure: Secondary | ICD-10-CM | POA: Diagnosis not present

## 2021-03-05 DIAGNOSIS — Z7982 Long term (current) use of aspirin: Secondary | ICD-10-CM

## 2021-03-05 DIAGNOSIS — Z87891 Personal history of nicotine dependence: Secondary | ICD-10-CM

## 2021-03-05 DIAGNOSIS — Z79899 Other long term (current) drug therapy: Secondary | ICD-10-CM

## 2021-03-05 DIAGNOSIS — Z20822 Contact with and (suspected) exposure to covid-19: Secondary | ICD-10-CM | POA: Diagnosis present

## 2021-03-05 DIAGNOSIS — J810 Acute pulmonary edema: Secondary | ICD-10-CM

## 2021-03-05 DIAGNOSIS — R079 Chest pain, unspecified: Secondary | ICD-10-CM | POA: Diagnosis present

## 2021-03-05 DIAGNOSIS — Z66 Do not resuscitate: Secondary | ICD-10-CM | POA: Diagnosis present

## 2021-03-05 DIAGNOSIS — I5030 Unspecified diastolic (congestive) heart failure: Secondary | ICD-10-CM | POA: Diagnosis present

## 2021-03-05 DIAGNOSIS — Z8249 Family history of ischemic heart disease and other diseases of the circulatory system: Secondary | ICD-10-CM

## 2021-03-05 DIAGNOSIS — R778 Other specified abnormalities of plasma proteins: Secondary | ICD-10-CM | POA: Diagnosis present

## 2021-03-05 DIAGNOSIS — Z7902 Long term (current) use of antithrombotics/antiplatelets: Secondary | ICD-10-CM

## 2021-03-05 LAB — COMPREHENSIVE METABOLIC PANEL
ALT: 24 U/L (ref 0–44)
AST: 27 U/L (ref 15–41)
Albumin: 3.2 g/dL — ABNORMAL LOW (ref 3.5–5.0)
Alkaline Phosphatase: 65 U/L (ref 38–126)
Anion gap: 5 (ref 5–15)
BUN: 17 mg/dL (ref 8–23)
CO2: 26 mmol/L (ref 22–32)
Calcium: 9 mg/dL (ref 8.9–10.3)
Chloride: 107 mmol/L (ref 98–111)
Creatinine, Ser: 1.14 mg/dL (ref 0.61–1.24)
GFR, Estimated: 60 mL/min — ABNORMAL LOW (ref >60)
Glucose, Bld: 150 mg/dL — ABNORMAL HIGH (ref 70–99)
Potassium: 4.2 mmol/L (ref 3.5–5.1)
Sodium: 138 mmol/L (ref 135–145)
Total Bilirubin: 0.9 mg/dL (ref 0.3–1.2)
Total Protein: 6.3 g/dL — ABNORMAL LOW (ref 6.5–8.1)

## 2021-03-05 LAB — RESP PANEL BY RT-PCR (FLU A&B, COVID) ARPGX2
Influenza A by PCR: NEGATIVE
Influenza B by PCR: NEGATIVE
SARS Coronavirus 2 by RT PCR: NEGATIVE

## 2021-03-05 LAB — CBC WITH DIFFERENTIAL/PLATELET
Abs Immature Granulocytes: 0.01 10*3/uL (ref 0.00–0.07)
Basophils Absolute: 0 10*3/uL (ref 0.0–0.1)
Basophils Relative: 1 %
Eosinophils Absolute: 0.2 10*3/uL (ref 0.0–0.5)
Eosinophils Relative: 5 %
HCT: 29.3 % — ABNORMAL LOW (ref 39.0–52.0)
Hemoglobin: 10 g/dL — ABNORMAL LOW (ref 13.0–17.0)
Immature Granulocytes: 0 %
Lymphocytes Relative: 26 %
Lymphs Abs: 1.1 10*3/uL (ref 0.7–4.0)
MCH: 31.8 pg (ref 26.0–34.0)
MCHC: 34.1 g/dL (ref 30.0–36.0)
MCV: 93.3 fL (ref 80.0–100.0)
Monocytes Absolute: 0.4 10*3/uL (ref 0.1–1.0)
Monocytes Relative: 11 %
Neutro Abs: 2.4 10*3/uL (ref 1.7–7.7)
Neutrophils Relative %: 57 %
Platelets: 117 10*3/uL — ABNORMAL LOW (ref 150–400)
RBC: 3.14 MIL/uL — ABNORMAL LOW (ref 4.22–5.81)
RDW: 14.1 % (ref 11.5–15.5)
WBC: 4.2 10*3/uL (ref 4.0–10.5)
nRBC: 0 % (ref 0.0–0.2)

## 2021-03-05 LAB — TROPONIN I (HIGH SENSITIVITY)
Troponin I (High Sensitivity): 124 ng/L (ref <18)
Troponin I (High Sensitivity): 127 ng/L (ref <18)
Troponin I (High Sensitivity): 130 ng/L (ref <18)

## 2021-03-05 LAB — BRAIN NATRIURETIC PEPTIDE: B Natriuretic Peptide: 540 pg/mL — ABNORMAL HIGH (ref 0.0–100.0)

## 2021-03-05 LAB — MAGNESIUM: Magnesium: 1.8 mg/dL (ref 1.7–2.4)

## 2021-03-05 MED ORDER — FUROSEMIDE 10 MG/ML IJ SOLN
20.0000 mg | Freq: Once | INTRAMUSCULAR | Status: AC
  Administered 2021-03-05: 20 mg via INTRAVENOUS
  Filled 2021-03-05: qty 2

## 2021-03-05 MED ORDER — ACETAMINOPHEN 650 MG RE SUPP: 650.0000 mg | Freq: Four times a day (QID) | RECTAL | Status: DC | PRN

## 2021-03-05 MED ORDER — FUROSEMIDE 10 MG/ML IJ SOLN
20.0000 mg | INTRAMUSCULAR | Status: AC
  Administered 2021-03-05: 20 mg via INTRAVENOUS
  Filled 2021-03-05: qty 2

## 2021-03-05 MED ORDER — FUROSEMIDE 10 MG/ML IJ SOLN
40.0000 mg | Freq: Two times a day (BID) | INTRAMUSCULAR | Status: DC
  Administered 2021-03-06: 40 mg via INTRAVENOUS
  Filled 2021-03-05: qty 4

## 2021-03-05 MED ORDER — ASPIRIN 81 MG PO CHEW
324.0000 mg | CHEWABLE_TABLET | Freq: Once | ORAL | Status: AC
Start: 2021-03-05 — End: 2021-03-05
  Administered 2021-03-05: 324 mg via ORAL
  Filled 2021-03-05: qty 4

## 2021-03-05 MED ORDER — ACETAMINOPHEN 325 MG PO TABS: 650.0000 mg | ORAL_TABLET | Freq: Four times a day (QID) | ORAL | Status: DC | PRN

## 2021-03-05 MED ORDER — IOHEXOL 350 MG/ML SOLN
75.0000 mL | Freq: Once | INTRAVENOUS | Status: AC | PRN
Start: 2021-03-05 — End: 2021-03-05
  Administered 2021-03-05: 75 mL via INTRAVENOUS

## 2021-03-05 MED ORDER — ONDANSETRON HCL 4 MG/2ML IJ SOLN: 4.0000 mg | Freq: Four times a day (QID) | INTRAMUSCULAR | Status: DC | PRN

## 2021-03-05 MED ORDER — ONDANSETRON HCL 4 MG PO TABS: 4.0000 mg | ORAL_TABLET | Freq: Four times a day (QID) | ORAL | Status: DC | PRN

## 2021-03-05 MED ORDER — ENOXAPARIN SODIUM 30 MG/0.3ML IJ SOSY
30.0000 mg | PREFILLED_SYRINGE | INTRAMUSCULAR | Status: DC
  Administered 2021-03-05: 30 mg via SUBCUTANEOUS
  Filled 2021-03-05: qty 0.3

## 2021-03-05 NOTE — ED Triage Notes (Signed)
Shortness of breath onset last night, states he could not sleep

## 2021-03-05 NOTE — ED Provider Notes (Signed)
At change of shift, care signed out from Dr. Doren Custard to follow-up results and disposition accordingly.  Patient presents with shortness of breath, elevated troponin which seems stably elevated and a BNP that is also high.  I discussed the case with Dr. Debara Pickett of cardiology who recommends diuresis echocardiogram in the hospital locally, no need for transfer with patient's current status.  I discussed the care with Dr. Denton Brick who is agreeable.   Noemi Chapel, MD 03/05/21 1655

## 2021-03-05 NOTE — ED Provider Notes (Signed)
[facility] EMERGENCY DEPARTMENT Provider Note   CSN: [id number] Arrival date & time: 03/05/21  1201     History  Chief Complaint  Patient presents with   Shortness of Breath    Charles Lester is a 88 y.o. male.   Shortness of Breath Patient presents for shortness of breath.  Medical history is notable for CAD, HLD, carotid stenosis, aortic stenosis, PVD, T2DM, PAD, and HTN.  At baseline, he is highly functional.  He lives at home with his wife.  His daughters visit frequently.  His wife has recently been in poor health and he has been doing all of the work at home.  Yesterday, he reports that he was in his normal state of health.  Last night, he experienced shortness of breath and this caused him to have a difficult time sleeping.  He normally does not lay flat.  He sleeps being propped up.  Due to shortness of breath while talking sleep, patient was up and moving around.  This did slightly improve his symptoms.  He has been walking today.  He does not feel that his shortness of breath is related to exertion.  Currently, he has improved symptoms but still does not feel that his breathing is baseline.  He denies any associated chest discomfort.  He has not had a cough.    Home Medications Prior to Admission medications   Medication Sig Start Date End Date Taking? Authorizing Provider  amLODipine (NORVASC) 2.5 MG tablet Take 1 tablet (2.5 mg total) by mouth daily. 10/22/20   Verta Ellen., NP  aspirin EC 81 MG tablet Take 1 tablet (81 mg total) by mouth daily. 10/22/20   Verta Ellen., NP  atorvastatin (LIPITOR) 20 MG tablet Take 1 tablet (20 mg total) by mouth daily. 10/22/20   Verta Ellen., NP  Cholecalciferol (VITAMIN D3) 10 MCG (400 UNIT) CAPS Take 1 capsule by mouth daily.    [provider]  clopidogrel (PLAVIX) 75 MG tablet Take 1 tablet (75 mg total) by mouth daily. 10/22/20   Verta Ellen., NP  ferrous sulfate 325 (65 FE) MG tablet Take 325 mg by  mouth daily with breakfast.    [provider]  finasteride (PROSCAR) 5 MG tablet Take 1 tablet by mouth daily. 06/11/12   [provider]  lisinopril (ZESTRIL) 40 MG tablet Take 1 tablet (40 mg total) by mouth daily. 10/22/20   Verta Ellen., NP  metoprolol tartrate (LOPRESSOR) 25 MG tablet Take 1 tablet (25 mg total) by mouth 2 (two) times daily. 10/22/20   Verta Ellen., NP  niacin (NIASPAN) 1000 MG CR tablet Take 1,000 mg by mouth daily. 03/27/19   [provider]  Omega-3 Fatty Acids (FISH OIL) 1000 MG CAPS Take by mouth.    [provider]  tamsulosin (FLOMAX) 0.4 MG CAPS Take 1 capsule by mouth in the morning and at bedtime. 06/01/12   [provider]  vitamin C (ASCORBIC ACID) 500 MG tablet Take 500 mg by mouth daily.    [provider]      Allergies    Patient has no known allergies.    Review of Systems   Review of Systems  Constitutional:  Positive for fatigue.  Respiratory:  Positive for shortness of breath.   All other systems reviewed and are negative.  Physical Exam Updated Vital Signs BP (!) 154/61    Pulse 76    Temp 97.6  F (36.4 C) (Oral)    Resp (!) 22    SpO2 96%  Physical Exam Vitals and nursing note reviewed.  Constitutional:      General: He is not in acute distress.    Appearance: He is well-developed and normal weight. He is not ill-appearing, toxic-appearing or diaphoretic.  HENT:     Head: Normocephalic and atraumatic.     Mouth/Throat:     Mouth: Mucous membranes are moist.  Eyes:     Conjunctiva/sclera: Conjunctivae normal.  Cardiovascular:     Rate and Rhythm: Normal rate and regular rhythm.     Heart sounds: No murmur heard. Pulmonary:     Effort: Pulmonary effort is normal. Tachypnea present. No respiratory distress.     Breath sounds: Rales present. No decreased breath sounds or wheezing.     Comments: Slightly increased work of breathing Abdominal:     Palpations: Abdomen is soft.      Tenderness: There is no abdominal tenderness.  Musculoskeletal:        General: No swelling.     Cervical back: Normal range of motion and neck supple.     Right lower leg: No edema.     Left lower leg: No edema.  Skin:    General: Skin is warm and dry.     Capillary Refill: Capillary refill takes less than 2 seconds.  Neurological:     General: No focal deficit present.     Mental Status: He is alert and oriented to person, place, and time.     Cranial Nerves: No cranial nerve deficit.     Motor: No weakness.  Psychiatric:        Mood and Affect: Mood normal.        Behavior: Behavior normal.    ED Results / Procedures / Treatments   Labs (all labs ordered are listed, but only abnormal results are displayed) Labs Reviewed  COMPREHENSIVE METABOLIC PANEL - Abnormal; Notable for the following components:      Result Value   Glucose, Bld 150 (*)    Total Protein 6.3 (*)    Albumin 3.2 (*)    GFR, Estimated 60 (*)    All other components within normal limits  CBC WITH DIFFERENTIAL/PLATELET - Abnormal; Notable for the following components:   RBC 3.14 (*)    Hemoglobin 10.0 (*)    HCT 29.3 (*)    Platelets 117 (*)    All other components within normal limits  BRAIN NATRIURETIC PEPTIDE - Abnormal; Notable for the following components:   B Natriuretic Peptide 540.0 (*)    All other components within normal limits  TROPONIN I (HIGH SENSITIVITY) - Abnormal; Notable for the following components:   Troponin I (High Sensitivity) 124 (*)    All other components within normal limits  TROPONIN I (HIGH SENSITIVITY) - Abnormal; Notable for the following components:   Troponin I (High Sensitivity) 127 (*)    All other components within normal limits  RESP PANEL BY RT-PCR (FLU A&B, COVID) [id number]  MAGNESIUM    EKG EKG Interpretation  Date/Time:  Saturday March 05 2021 15:40:11 EST Ventricular Rate:  81 PR Interval:  150 QRS Duration: 95 QT Interval:  384 QTC  Calculation: 446 R Axis:   79 Text Interpretation: Sinus rhythm Supraventricular bigeminy Anteroseptal infarct, old Repol abnrm suggests ischemia, lateral leads Confirmed by Noemi Chapel [phone]) on 03/05/2021 3:49:30 PM  Radiology CT Angio Chest PE W and/or Wo Contrast  Result Date: 03/05/2021 CLINICAL DATA:  Shortness of breath. EXAM: CT ANGIOGRAPHY CHEST WITH CONTRAST TECHNIQUE: Multidetector CT imaging of the chest was performed using the standard protocol during bolus administration of intravenous contrast. Multiplanar CT image reconstructions and MIPs were obtained to evaluate the vascular anatomy. RADIATION DOSE REDUCTION: This exam was performed according to the departmental dose-optimization program which includes automated exposure control, adjustment of the mA and/or kV according to patient size and/or use of iterative reconstruction technique. CONTRAST:  24mL OMNIPAQUE IOHEXOL 350 MG/ML SOLN COMPARISON:  None. FINDINGS: Cardiovascular: Satisfactory opacification of the pulmonary arteries to the segmental level. No evidence of pulmonary embolism. Normal heart size. No pericardial effusion. Thoracic aortic atherosclerosis. Coronary artery atherosclerosis. Mediastinum/Nodes: No enlarged mediastinal, hilar, or axillary lymph nodes. Thyroid gland, trachea, and esophagus demonstrate no significant findings. Lungs/Pleura: Small bilateral pleural effusions. Bilateral mild interstitial thickening in the upper and lower lobes. Focal area of interstitial thickening and ground-glass opacities at the lung apices. No pneumothorax. Upper Abdomen: No acute abnormality. Musculoskeletal: No acute osseous abnormality. No aggressive osseous lesion. Generalized osteopenia. Review of the MIP images confirms the above findings. IMPRESSION: 1. No pulmonary embolism. 2. Small bilateral pleural effusions with mild interstitial thickening in the upper and lower lobes, suggestive of pulmonary edema. 3. Focal area of  interstitial thickening and ground-glass opacities at the lung apices concerning for atypical infection versus related to pulmonary edema. 4. Aortic Atherosclerosis (ICD10-I70.0). Electronically Signed   By: Kathreen Devoid M.D.   On: 03/05/2021 15:34   DG Chest Portable 1 View  Result Date: 03/05/2021 CLINICAL DATA:  Shortness of breath EXAM: PORTABLE CHEST 1 VIEW COMPARISON:  No recent examination available for comparison FINDINGS: The heart is normal in size. Atherosclerotic calcification of the aortic arch. Bilateral lower lobe opacities, right worse than the left concerning for airspace disease. Right upper lobe opacity, likely scarring. IMPRESSION: Bilateral lower lobe reticulonodular and hazy opacities, right worse than the left, concerning for pneumonia. Follow-up examination to resolution is recommended. Electronically Signed   By: Keane Police D.O.   On: 03/05/2021 13:07    Procedures Procedures    Medications Ordered in ED Medications  furosemide (LASIX) injection 20 mg (has no administration in time range)  aspirin chewable tablet 324 mg (324 mg Oral Given 03/05/21 1423)  iohexol (OMNIPAQUE) 350 MG/ML injection 75 mL (75 mLs Intravenous Contrast Given 03/05/21 1507)  furosemide (LASIX) injection 20 mg (20 mg Intravenous Given 03/05/21 1602)    ED Course/ Medical Decision Making/ A&P                           Medical Decision Making Amount and/or Complexity of Data Reviewed Labs: ordered. Radiology: ordered.  Risk OTC drugs. Prescription drug management.   This patient presents to the ED for concern of shortness of breath, this involves an extensive number of treatment options, and is a complaint that carries with it a high risk of complications and morbidity.  The differential diagnosis includes CHF, COPD, pneumonia, PE, ACS, viral infection   Co morbidities that complicate the patient evaluation  CAD, HLD, carotid stenosis, aortic stenosis, PVD, T2DM, PAD, and  HTN   Additional history obtained:  Additional history obtained from N/A External records from outside source obtained and reviewed including EMR   Lab Tests:  I Ordered, and personally interpreted labs.  The pertinent results include: Elevated troponin with minimal change on repeat, elevated BNP, normal electrolytes, no leukocytosis, baseline anemia   Imaging Studies ordered:  I ordered  imaging studies including chest x-ray, CTA chest I independently visualized and interpreted imaging which showed pulmonary edema with small bilateral pleural effusions I agree with the radiologist interpretation   Cardiac Monitoring:  The patient was maintained on a cardiac monitor.  I personally viewed and interpreted the cardiac monitored which showed an underlying rhythm of: Sinus rhythm   Medicines ordered and prescription drug management:  I ordered medication including Lasix for pulmonary edema Reevaluation of the patient after these medicines showed that the patient stayed the same I have reviewed the patients home medicines and have made adjustments as needed  Problem List / ED Course:  Highly functional 88 year old male presenting from home for shortness of breath.  He noticed this last night as he was trying to sleep.  He did not feel like it was exertional but rather related to him laying in a recumbent position.  Symptoms seem to improve when he got up and walked around.  He has been ambulatory today as well and does not feel that his shortness of breath worsens with exertion.  He denies any chest discomfort throughout these symptoms.  Prior to last night, he states that he was in his normal state of health.  Per chart review, patient is followed by [facility] in the [place].  Last echocardiogram was 4 years ago.  At that time, he had a normal EF but aortic stenosis and aortic regurgitation were noted.  He was last seen at cardiology office in September.  Current medications include aspirin,  Plavix, and antihypertensives.  He is not on a diuretic.  Patient was given bedside cardiac monitor.  He had modestly elevated blood pressure with a normal heart rate.  He was maintaining normal SPO2 on room air.  His breathing remained minimally labored.  His work-up is consistent with pulmonary edema that is likely cardiogenic in etiology.  BNP is elevated at 540 with no prior lab values for comparison.  His chest x-ray showed nonspecific bibasilar opacities.  CTA of chest was ordered for further characterization.  CTA chest showed findings consistent with pulmonary edema with small bilateral pleural effusions.  Patient's electrolytes were normal.  He is likely Lasix nave.  He was given 20 mg IV dose of Lasix.  Following repositioning for CT scan, patient did have a slight amount of exertional dyspnea.  Vital signs remained stable at this time.  Patient would benefit from admission to hospital.  Care of patient was signed out to oncoming ED provider.   Reevaluation:  After the interventions noted above, I reevaluated the patient and found that they have :stayed the same   Social Determinants of Health:  Patient has access to outpatient care, including cardiology   Dispostion:  After consideration of the diagnostic results and the patients response to treatment, I feel that the patent would benefit from reassessment and likely admission.          Final Clinical Impression(s) / ED Diagnoses Final diagnoses:  Acute pulmonary edema (HCC)  Pleural effusion, bilateral  Shortness of breath    Rx / DC Orders ED Discharge Orders     None         Godfrey Pick, MD 03/05/21 [phone]

## 2021-03-05 NOTE — H&P (Addendum)
History and Physical    Patient: Charles Lester UEA:[id number] DOB: Feb 09, 1924 DOA: 03/05/2021 DOS: the patient was seen and examined on 03/05/2021 PCP: Charles Gravel, MD  Patient coming from: Home  Chief Complaint:  Chief Complaint  Patient presents with   Shortness of Breath    HPI: Charles Lester is a 88 y.o. male with medical history significant for diabetes mellitus, hypertension, coronary artery disease, peripheral vascular disease. Patient presented to the ED with complaints of difficulty breathing that started last night.  He reports he was unable to lie back to sleep.  No chest pain.  No lower extremity swelling.  No fevers no chills, no cough.  He denies prior episodes. He is on aspirin and Plavix and compliant.  ED course, stable vitals.  Troponin 124 > 127.  CTA chest negative for PE, shows small bilateral pleural effusions, and pulmonary edema.  Lung apices with focal thickening and groundglass opacities concerning for atypical infection versus edema. IV Lasix 20 mg X 2 given.   Review of Systems: As mentioned in the history of present illness. All other systems reviewed and are negative. Past Medical History:  Diagnosis Date   Aortic stenosis    Mild to moderate 2017   Arthritis    CAD (coronary artery disease)    Essential hypertension    Hyperlipidemia    Ischemic heart disease    Based on Myoview 2010 revealing inferolateral scar but no active ischemia   PAD (peripheral artery disease) (HCC)    Type 2 diabetes mellitus (HCC)    Past Surgical History:  Procedure Laterality Date   NO PAST SURGERIES     Social History:  reports that he quit smoking about 16 years ago. He has never used smokeless tobacco. He reports that he does not drink alcohol and does not use drugs.  No Known Allergies  Family History  Problem Relation Age of Onset   Hypertension Father    Hypertension Sister    Hypertension Brother    Coronary artery disease Neg Hx     Prior to Admission  medications   Medication Sig Start Date End Date Taking? Authorizing Provider  amLODipine (NORVASC) 2.5 MG tablet Take 1 tablet (2.5 mg total) by mouth daily. 10/22/20   Verta Ellen., NP  aspirin EC 81 MG tablet Take 1 tablet (81 mg total) by mouth daily. 10/22/20   Verta Ellen., NP  atorvastatin (LIPITOR) 20 MG tablet Take 1 tablet (20 mg total) by mouth daily. 10/22/20   Verta Ellen., NP  Cholecalciferol (VITAMIN D3) 10 MCG (400 UNIT) CAPS Take 1 capsule by mouth daily.    [provider]  clopidogrel (PLAVIX) 75 MG tablet Take 1 tablet (75 mg total) by mouth daily. 10/22/20   Verta Ellen., NP  ferrous sulfate 325 (65 FE) MG tablet Take 325 mg by mouth daily with breakfast.    [provider]  finasteride (PROSCAR) 5 MG tablet Take 1 tablet by mouth daily. 06/11/12   [provider]  lisinopril (ZESTRIL) 40 MG tablet Take 1 tablet (40 mg total) by mouth daily. 10/22/20   Verta Ellen., NP  metoprolol tartrate (LOPRESSOR) 25 MG tablet Take 1 tablet (25 mg total) by mouth 2 (two) times daily. 10/22/20   Verta Ellen., NP  niacin (NIASPAN) 1000 MG CR tablet Take 1,000 mg by mouth daily. 03/27/19   [provider]  Omega-3 Fatty Acids (FISH OIL) 1000 MG  CAPS Take by mouth.    [provider]  tamsulosin (FLOMAX) 0.4 MG CAPS Take 1 capsule by mouth in the morning and at bedtime. 06/01/12   [provider]  vitamin C (ASCORBIC ACID) 500 MG tablet Take 500 mg by mouth daily.    [provider]    Physical Exam: Vitals:   03/05/21 1400 03/05/21 1524 03/05/21 1530 03/05/21 1600  BP: (!) 129/53 (!) 145/60 (!) 147/61 (!) 154/61  Pulse: 66 79 73 76  Resp: 19 16 (!) 22 (!) 22  Temp:      TempSrc:      SpO2: 96% 95% 97% 96%    Constitutional: NAD, calm, comfortable Eyes: PERRL, lids and conjunctivae normal ENMT: Mucous membranes are moist.  Neck: normal, supple, no masses, no thyromegaly Respiratory:  clear to auscultation bilaterally, no wheezing, no crackles. Normal respiratory effort. No accessory muscle use.  Cardiovascular: Regular rate and rhythm, no murmurs / rubs / gallops. No extremity edema. 2+ pedal pulses. No carotid bruits.  Abdomen: no tenderness, no masses palpated. No hepatosplenomegaly. Bowel sounds positive.  Musculoskeletal: no clubbing / cyanosis. No joint deformity upper and lower extremities. Good ROM, no contractures. Normal muscle tone.  Skin: no rashes, lesions, ulcers. No induration Neurologic: No apparent cranial nerve abnormality, moving EXTR spontaneously.  Psychiatric: Normal judgment and insight. Alert and oriented x 3. Normal mood.    Data Reviewed: EKG showing sinus rhythm rate 61, QTc 446.  Diffuse T wave abnormalities in V5 V6 2 3 aVF similar to prior  Assessment and Plan:  Congestive heart failure- unspecified type.  Dyspnea, not hypoxic.  CTA chest showing small bilateral pleural effusions and pulmonary edema.  BNP elevated at 540, no prior to compare.  Per charts his weight has declined over the past year.  Last echo 2019 EF 55 to 60%, with mild to moderate aortic regurg. -Obtain echocardiogram -IV Lasix 40 twice daily - Strict input output, daily weights  Coronary artery disease history, elevated troponin- 124 > 127.  EKG with old diffuse T wave abnormalities.  Follows with cardiology last visit 09/2020, I do not see specific details about his CAD. - EDP talked to cardiology, Dr. Debara Pickett, recommended diuresis echo. - Trend Troponin -Continue aspirin, Plavix  Hypertension-labile. -Resume lisinopril, metoprolol, Norvasc, tamsulosin  Diabetes mellitus-glucose 150.  Not on medication -Daily fasting CBG - hgba1c  Peripheral vascular disease,  -Continue aspirin, Plavix  Advance Care Planning: DNR.  Confirmed with patient at bedside and daughter Charles Lester at bedside.  Consults:   Family Communication: Daughter Charles Lester at bedside HCPOA  Severity of  Illness: The appropriate patient status for this patient is OBSERVATION. Observation status is judged to be reasonable and necessary in order to provide the required intensity of service to ensure the patient's safety. The patient's presenting symptoms, physical exam findings, and initial radiographic and laboratory data in the context of their medical condition is felt to place them at decreased risk for further clinical deterioration. Furthermore, it is anticipated that the patient will be medically stable for discharge from the hospital within 2 midnights of admission.   Author: Bethena Roys, MD 03/05/2021 9:15 PM  For on call review [email or web address].

## 2021-03-05 NOTE — Progress Notes (Signed)
Patient arrived to unit at 615 PM. Alert, oriented. Vitals stable. Patient's daughter at bedside. Patient provided a sandwich bag from dietary, placed on telemetry, oriented to room and call bell placed within reach.

## 2021-03-05 NOTE — ED Notes (Signed)
Patient transported to CT 

## 2021-03-05 NOTE — Plan of Care (Signed)

## 2021-03-06 ENCOUNTER — Observation Stay (HOSPITAL_COMMUNITY): Payer: Medicare Other

## 2021-03-06 DIAGNOSIS — J810 Acute pulmonary edema: Secondary | ICD-10-CM

## 2021-03-06 DIAGNOSIS — R0602 Shortness of breath: Secondary | ICD-10-CM

## 2021-03-06 DIAGNOSIS — I10 Essential (primary) hypertension: Secondary | ICD-10-CM | POA: Diagnosis present

## 2021-03-06 DIAGNOSIS — E119 Type 2 diabetes mellitus without complications: Secondary | ICD-10-CM

## 2021-03-06 DIAGNOSIS — I6523 Occlusion and stenosis of bilateral carotid arteries: Secondary | ICD-10-CM | POA: Diagnosis not present

## 2021-03-06 DIAGNOSIS — I2510 Atherosclerotic heart disease of native coronary artery without angina pectoris: Secondary | ICD-10-CM

## 2021-03-06 DIAGNOSIS — I509 Heart failure, unspecified: Secondary | ICD-10-CM | POA: Diagnosis not present

## 2021-03-06 DIAGNOSIS — I5030 Unspecified diastolic (congestive) heart failure: Secondary | ICD-10-CM | POA: Diagnosis present

## 2021-03-06 LAB — PROCALCITONIN: Procalcitonin: <0.1 ng/mL

## 2021-03-06 LAB — ECHOCARDIOGRAM COMPLETE
AR max vel: 1.39 cm2
AV Area VTI: 1.37 cm2
AV Area mean vel: 1.16 cm2
AV Mean grad: 13 mmHg
AV Peak grad: 25.2 mmHg
Ao pk vel: 2.51 m/s
Area-P 1/2: 3.6 cm2
Calc EF: 30.4 %
MV VTI: 3.07 cm2
P 1/2 time: 210 msec
S' Lateral: 4.4 cm
Single Plane A2C EF: 24.7 %
Single Plane A4C EF: 39.7 %
Weight: 2310.42 oz

## 2021-03-06 LAB — BASIC METABOLIC PANEL
Anion gap: 6 (ref 5–15)
BUN: 17 mg/dL (ref 8–23)
CO2: 28 mmol/L (ref 22–32)
Calcium: 8.9 mg/dL (ref 8.9–10.3)
Chloride: 107 mmol/L (ref 98–111)
Creatinine, Ser: 1.09 mg/dL (ref 0.61–1.24)
GFR, Estimated: >60 mL/min (ref >60)
Glucose, Bld: 124 mg/dL — ABNORMAL HIGH (ref 70–99)
Potassium: 3.7 mmol/L (ref 3.5–5.1)
Sodium: 141 mmol/L (ref 135–145)

## 2021-03-06 LAB — HEMOGLOBIN A1C
Hgb A1c MFr Bld: 4.5 % — ABNORMAL LOW (ref 4.8–5.6)
Mean Plasma Glucose: 82.45 mg/dL

## 2021-03-06 LAB — LACTIC ACID, PLASMA: Lactic Acid, Venous: 1.2 mmol/L (ref 0.5–1.9)

## 2021-03-06 MED ORDER — SENNOSIDES-DOCUSATE SODIUM 8.6-50 MG PO TABS
1.0000 | ORAL_TABLET | Freq: Two times a day (BID) | ORAL | Status: DC
  Administered 2021-03-06 – 2021-03-07 (×3): 1 via ORAL
  Filled 2021-03-06 (×3): qty 1

## 2021-03-06 MED ORDER — ENOXAPARIN SODIUM 40 MG/0.4ML IJ SOSY
40.0000 mg | PREFILLED_SYRINGE | INTRAMUSCULAR | Status: DC
  Administered 2021-03-06: 40 mg via SUBCUTANEOUS
  Filled 2021-03-06: qty 0.4

## 2021-03-06 MED ORDER — POLYETHYLENE GLYCOL 3350 17 G PO PACK
17.0000 g | PACK | Freq: Once | ORAL | Status: AC
  Administered 2021-03-06: 17 g via ORAL
  Filled 2021-03-06: qty 1

## 2021-03-06 MED ORDER — FUROSEMIDE 10 MG/ML IJ SOLN
20.0000 mg | Freq: Two times a day (BID) | INTRAMUSCULAR | Status: DC
  Administered 2021-03-06: 20 mg via INTRAVENOUS
  Filled 2021-03-06: qty 2

## 2021-03-06 NOTE — Assessment & Plan Note (Addendum)
Dyspnea, not hypoxic.  -  CTA chest showing small bilateral pleural effusions and -pulmonary edema.  BNP elevated at 540,  - Per charts his weight has declined over the past year.  - Last echo 2019 EF 55 to 60%, with mild to moderate aortic regurg.    -IV Lasix 40 twice daily >>> switch to p.o. 20 mg daily - Daily weight recommended -Cardiology consulted: Recommended metoprolol succinate 50 mg daily, Imdur 50 mg daily, hydralazine 10 mg p.o. 3 times daily, Lasix 20 mg daily Will consider ACE/ARB/ARN I/MRA --discontinue lisinopril due to AKI  03/06/21 Echo: Left ventricular ejection fraction, by estimation, is 30 to 35%. The  left ventricle has moderately decreased function. The left ventricle  demonstrates regional wall motion abnormalities (see scoring  diagram/findings for description). There is mild  left ventricular hypertrophy. Left ventricular diastolic parameters are consistent with Grade I diastolic dysfunction   Cardiology has cleared the patient for discharge and follow-up as an outpatient

## 2021-03-06 NOTE — Assessment & Plan Note (Signed)
- 

## 2021-03-06 NOTE — Hospital Course (Signed)
Per HPI: Charles Lester is a 88 y.o. male with medical history significant for diabetes mellitus, hypertension, coronary artery disease, peripheral vascular disease. Patient presented to the ED with complaints of difficulty breathing that started last night.  He reports he was unable to lie back to sleep.   No chest pain.  No lower extremity swelling.  No fevers no chills, no cough.  He denies prior episodes. He is on aspirin and Plavix and compliant.   ED course, stable vitals.  Troponin 124 > 127.>> 130 CTA chest negative for PE, shows small bilateral pleural effusions, and pulmonary edema.  Lung apices with focal thickening and groundglass opacities concerning for atypical infection versus edema. But he has been afebrile, with no leukocytosis BNP elevated at 540, IV Lasix 20 mg X 2 given.

## 2021-03-06 NOTE — Assessment & Plan Note (Signed)
-  glucose 150.  Not on medication -Daily fasting CBG - Hgba1c

## 2021-03-06 NOTE — Progress Notes (Signed)
*  PRELIMINARY RESULTS* Echocardiogram 2D Echocardiogram has been performed.  Charles Lester 03/06/2021, 8:55 AM

## 2021-03-06 NOTE — Progress Notes (Addendum)
PROGRESS NOTE    Patient: Charles Lester                            PCP: [name], MD                    DOB: 1924-11-23            DOA: 03/05/2021 NWG:[id number]             DOS: 03/06/2021, 11:35 AM   LOS: 0 days   Date of Service: The patient was seen and examined on 03/06/2021  Subjective:   The patient was seen and examined this morning. Hemodynamically stable, not complaining of chest pain, reporting improved shortness of breath Otherwise no issues overnight .  Brief Narrative:   Per HPI: Charles Lester is a 88 y.o. male with medical history significant for diabetes mellitus, hypertension, coronary artery disease, peripheral vascular disease. Patient presented to the ED with complaints of difficulty breathing that started last night.  He reports he was unable to lie back to sleep.   No chest pain.  No lower extremity swelling.  No fevers no chills, no cough.  He denies prior episodes. He is on aspirin and Plavix and compliant.   ED course, stable vitals.  Troponin 124 > 127.>> 130 CTA chest negative for PE, shows small bilateral pleural effusions, and pulmonary edema.  Lung apices with focal thickening and groundglass opacities concerning for atypical infection versus edema. But he has been afebrile, with no leukocytosis BNP elevated at 540, IV Lasix 20 mg X 2 given.     Assessment & Plan:   Principal Problem:   Acute CHF ([place]) Active Problems:   DMII (diabetes mellitus, type 2) (HCC)   HTN (hypertension)   CORONARY ATHEROSCLEROSIS NATIVE CORONARY ARTERY   Carotid artery stenosis   Peripheral vascular disease (HCC)     Assessment and Plan: * Acute CHF ([place]) - Undetermined at this time likely diastolic -unspecified type.  Dyspnea, not hypoxic.  -  CTA chest showing small bilateral pleural effusions and -pulmonary edema.  BNP elevated at 540,  - Per charts his weight has declined over the past year.  - Last echo 2019 EF 55 to 60%, with mild to moderate aortic  regurg. -Echocardiogram >> -IV Lasix 40 twice daily - Strict input output, daily weights    DMII (diabetes mellitus, type 2) (HCC) -glucose 150.  Not on medication -Daily fasting CBG - Hgba1c  HTN (hypertension)- (present on admission) - stable now  -Resuming  lisinopril, metoprolol, Norvasc, tamsulosin  Peripheral vascular disease ([place])- (present on admission) Continue aspirin, Plavix  Carotid artery stenosis- (present on admission) Continue aspirin, Plavix  CORONARY ATHEROSCLEROSIS NATIVE CORONARY ARTERY- (present on admission) 124 > 127.  EKG with old diffuse T wave abnormalities.  Follows with cardiology last visit 09/2020, I do not see specific details about his CAD. - EDP talked to cardiology, Dr. Debara Pickett, recommended diuresis echo. - Trend Troponin -Continue aspirin, Plavix   ---------------------------------------------------------------------------------------------------------------------------  DVT prophylaxis:  enoxaparin (LOVENOX) injection 30 mg Start: 03/05/21 2030   Code Status:   Code Status: DNR  Family Communication: No family member present at bedside- attempt will be made to update daily The above findings and plan of care has been discussed with patient (and family)  in detail,  they expressed understanding and agreement of above. -Advance care planning has been discussed.   Admission status:   Status is:  Observation The patient remains OBS appropriate and will d/c before 2 midnights.      Procedures:   No admission procedures for hospital encounter.   Antimicrobials:  Anti-infectives (From admission, onward)    None        Medication:   enoxaparin (LOVENOX) injection  30 mg Subcutaneous Q24H   furosemide  40 mg Intravenous Q12H   senna-docusate  1 tablet Oral BID    acetaminophen **OR** acetaminophen, ondansetron **OR** ondansetron (ZOFRAN) IV   Objective:   Vitals:   03/05/21 1839 03/05/21 2112 03/06/21 0210 03/06/21 0501   BP:  (!) 125/55 120/61 (!) 123/57  Pulse:  68 70 77  Resp:  20 18 18   Temp: 98.1 F (36.7 C) 97.6 F (36.4 C) 97.7 F (36.5 C) 98 F (36.7 C)  TempSrc: Oral Oral Oral   SpO2:  100% 99% 99%  Weight:    65.5 kg    Intake/Output Summary (Last 24 hours) at 03/06/2021 1135 Last data filed at 03/06/2021 0900 Gross per 24 hour  Intake 720 ml  Output 2100 ml  Net -1380 ml   Filed Weights   03/06/21 0501  Weight: 65.5 kg     Examination:   Physical Exam  Constitution:  Alert, cooperative, no distress,  Appears calm and comfortable  Psychiatric:   Normal and stable mood and affect, cognition intact,   HEENT:        Normocephalic, PERRL, otherwise with in Normal limits  Chest:         Chest symmetric Cardio vascular:  S1/S2, RRR, No murmure, No Rubs or Gallops  pulmonary: Clear to auscultation bilaterally, respirations unlabored, negative wheezes / crackles Abdomen: Soft, non-tender, non-distended, bowel sounds,no masses, no organomegaly Muscular skeletal: Limited exam - in bed, able to move all 4 extremities,   Neuro: CNII-XII intact. , normal motor and sensation, reflexes intact  Extremities: No pitting edema lower extremities, +2 pulses  Skin: Dry, warm to touch, negative for any Rashes, No open wounds Wounds: per nursing documentation   ------------------------------------------------------------------------------------------------------------------------------------------    LABs:  CBC Latest Ref Rng & Units 03/05/2021 12/04/2016 10/27/2008  WBC 4.0 - 10.5 K/uL 4.2 4.2 -  Hemoglobin 13.0 - 17.0 g/dL 10.0(L) 9.9(L) 143  Hematocrit 39.0 - 52.0 % 29.3(L) 28.9(L) -  Platelets 150 - 400 K/uL 117(L) 160 -   CMP Latest Ref Rng & Units 03/06/2021 03/05/2021 12/04/2016  Glucose 70 - 99 mg/dL 124(H) 150(H) 112(H)  BUN 8 - 23 mg/dL 17 17 13   Creatinine 0.61 - 1.24 mg/dL 1.09 1.14 1.17  Sodium 135 - 145 mmol/L 141 138 137  Potassium 3.5 - 5.1 mmol/L 3.7 4.2 3.9  Chloride 98 - 111 mmol/L  107 107 102  CO2 22 - 32 mmol/L 28 26 26   Calcium 8.9 - 10.3 mg/dL 8.9 9.0 9.3  Total Protein 6.5 - 8.1 g/dL - 6.3(L) -  Total Bilirubin 0.3 - 1.2 mg/dL - 0.9 -  Alkaline Phos 38 - 126 U/L - 65 -  AST 15 - 41 U/L - 27 -  ALT 0 - 44 U/L - 24 -       Micro Results Recent Results (from the past 240 hour(s))  Resp Panel by RT-PCR (Flu A&B, Covid) Nasopharyngeal Swab     Status: None   Collection Time: 03/05/21 12:14 PM   Specimen: Nasopharyngeal Swab; Nasopharyngeal(NP) swabs in vial transport medium  Result Value Ref Range Status   SARS Coronavirus 2 by RT PCR NEGATIVE NEGATIVE Final  Comment: (NOTE) SARS-CoV-2 target nucleic acids are NOT DETECTED.  The SARS-CoV-2 RNA is generally detectable in upper respiratory specimens during the acute phase of infection. The lowest concentration of SARS-CoV-2 viral copies this assay can detect is 138 copies/mL. A negative result does not preclude SARS-Cov-2 infection and should not be used as the sole basis for treatment or other patient management decisions. A negative result may occur with  improper specimen collection/handling, submission of specimen other than nasopharyngeal swab, presence of viral mutation(s) within the areas targeted by this assay, and inadequate number of viral copies(<138 copies/mL). A negative result must be combined with clinical observations, patient history, and epidemiological information. The expected result is Negative.  Fact Sheet for Patients:  EntrepreneurPulse.com.au  Fact Sheet for Healthcare Providers:  IncredibleEmployment.be  This test is no t yet approved or cleared by the [place] FDA and  has been authorized for detection and/or diagnosis of SARS-CoV-2 by FDA under an Emergency Use Authorization (EUA). This EUA will remain  in effect (meaning this test can be used) for the duration of the COVID-19 declaration under Section 564(b)(1) of the Act,  21 U.S.C.section 360bbb-3(b)(1), unless the authorization is terminated  or revoked sooner.       Influenza A by PCR NEGATIVE NEGATIVE Final   Influenza B by PCR NEGATIVE NEGATIVE Final    Comment: (NOTE) The Xpert Xpress SARS-CoV-2/FLU/RSV plus assay is intended as an aid in the diagnosis of influenza from Nasopharyngeal swab specimens and should not be used as a sole basis for treatment. Nasal washings and aspirates are unacceptable for Xpert Xpress SARS-CoV-2/FLU/RSV testing.  Fact Sheet for Patients: EntrepreneurPulse.com.au  Fact Sheet for Healthcare Providers: IncredibleEmployment.be  This test is not yet approved or cleared by the [place] FDA and has been authorized for detection and/or diagnosis of SARS-CoV-2 by FDA under an Emergency Use Authorization (EUA). This EUA will remain in effect (meaning this test can be used) for the duration of the COVID-19 declaration under Section 564(b)(1) of the Act, 21 U.S.C. section 360bbb-3(b)(1), unless the authorization is terminated or revoked.  Performed at [facility], [address] [place], [place] [address]     Radiology Reports CT Angio Chest PE W and/or Wo Contrast  Result Date: 03/05/2021 CLINICAL DATA:  Shortness of breath. EXAM: CT ANGIOGRAPHY CHEST WITH CONTRAST TECHNIQUE: Multidetector CT imaging of the chest was performed using the standard protocol during bolus administration of intravenous contrast. Multiplanar CT image reconstructions and MIPs were obtained to evaluate the vascular anatomy. RADIATION DOSE REDUCTION: This exam was performed according to the departmental dose-optimization program which includes automated exposure control, adjustment of the mA and/or kV according to patient size and/or use of iterative reconstruction technique. CONTRAST:  33mL OMNIPAQUE IOHEXOL 350 MG/ML SOLN COMPARISON:  None. FINDINGS: Cardiovascular: Satisfactory opacification of the pulmonary  arteries to the segmental level. No evidence of pulmonary embolism. Normal heart size. No pericardial effusion. Thoracic aortic atherosclerosis. Coronary artery atherosclerosis. Mediastinum/Nodes: No enlarged mediastinal, hilar, or axillary lymph nodes. Thyroid gland, trachea, and esophagus demonstrate no significant findings. Lungs/Pleura: Small bilateral pleural effusions. Bilateral mild interstitial thickening in the upper and lower lobes. Focal area of interstitial thickening and ground-glass opacities at the lung apices. No pneumothorax. Upper Abdomen: No acute abnormality. Musculoskeletal: No acute osseous abnormality. No aggressive osseous lesion. Generalized osteopenia. Review of the MIP images confirms the above findings. IMPRESSION: 1. No pulmonary embolism. 2. Small bilateral pleural effusions with mild interstitial thickening in the upper and lower lobes, suggestive of pulmonary  edema. 3. Focal area of interstitial thickening and ground-glass opacities at the lung apices concerning for atypical infection versus related to pulmonary edema. 4. Aortic Atherosclerosis (ICD10-I70.0). Electronically Signed   By: Kathreen Devoid M.D.   On: 03/05/2021 15:34   DG Chest Portable 1 View  Result Date: 03/05/2021 CLINICAL DATA:  Shortness of breath EXAM: PORTABLE CHEST 1 VIEW COMPARISON:  No recent examination available for comparison FINDINGS: The heart is normal in size. Atherosclerotic calcification of the aortic arch. Bilateral lower lobe opacities, right worse than the left concerning for airspace disease. Right upper lobe opacity, likely scarring. IMPRESSION: Bilateral lower lobe reticulonodular and hazy opacities, right worse than the left, concerning for pneumonia. Follow-up examination to resolution is recommended. Electronically Signed   By: Keane Police D.O.   On: 03/05/2021 13:07   ECHOCARDIOGRAM COMPLETE  Result Date: 03/06/2021    ECHOCARDIOGRAM REPORT   Patient Name:   Charles Lester Date of Exam:  03/06/2021 Medical Rec #:  [id number]      Height:       67.0 in Accession #:    [id number]     Weight:       144.4 lb Date of Birth:  20-Mar-1924      BSA:          1.761 m Patient Age:    26 years       BP:           123/57 mmHg Patient Gender: M              HR:           75 bpm. Exam Location:  [facility] Procedure: 2D Echo, Cardiac Doppler and Color Doppler Indications:    Pulmonary edema  History:        Patient has prior history of Echocardiogram examinations, most                 recent 03/07/2017. CHF, CAD; Risk Factors:Hypertension,                 Dyslipidemia and Former Smoker.  Sonographer:    Wenda Low Referring Phys: [place]  1. Left ventricular ejection fraction, by estimation, is 30 to 35%. The left ventricle has moderately decreased function. The left ventricle demonstrates regional wall motion abnormalities (see scoring diagram/findings for description). There is mild left ventricular hypertrophy. Left ventricular diastolic parameters are consistent with Grade I diastolic dysfunction (impaired relaxation). Elevated left ventricular end-diastolic pressure.  2. Right ventricular systolic function is normal. The right ventricular size is normal. There is normal pulmonary artery systolic pressure. The estimated right ventricular systolic pressure is 07.3 mmHg.  3. Left atrial size was moderately dilated.  4. The mitral valve is degenerative. Mild mitral valve regurgitation.  5. The aortic valve has an indeterminant number of cusps. There is moderate calcification of the aortic valve. Aortic valve regurgitation is moderate to severe. Moderate aortic valve stenosis with low gradient. Aortic regurgitation PHT measures 210 msec. Aortic valve area, by VTI measures 1.37 cm. Aortic valve mean gradient measures 13.0 mmHg. Dimentionless index 0.44.  6. The inferior vena cava is normal in size with greater than 50% respiratory variability, suggesting right atrial pressure of 3  mmHg. Comparison(s): Prior images reviewed side by side. LVEF has decreased, now 30-35% range. Aortic stenosis is moderate range and aortic regurgitation is moderate to severe. FINDINGS  Left Ventricle: Left ventricular ejection fraction, by estimation, is 30 to 35%. The left  ventricle has moderately decreased function. The left ventricle demonstrates regional wall motion abnormalities. The left ventricular internal cavity size was normal in size. There is mild left ventricular hypertrophy. Left ventricular diastolic parameters are consistent with Grade I diastolic dysfunction (impaired relaxation). Elevated left ventricular end-diastolic pressure.  LV Wall Scoring: The antero-lateral wall and posterior wall are akinetic. The anterior septum, inferior wall, apical lateral segment, and apex are hypokinetic. The entire anterior wall, inferior septum, and apical inferior segment are normal. Right Ventricle: The right ventricular size is normal. No increase in right ventricular wall thickness. Right ventricular systolic function is normal. There is normal pulmonary artery systolic pressure. The tricuspid regurgitant velocity is 1.87 m/s, and  with an assumed right atrial pressure of 3 mmHg, the estimated right ventricular systolic pressure is 86.7 mmHg. Left Atrium: Left atrial size was moderately dilated. Right Atrium: Right atrial size was normal in size. Pericardium: There is no evidence of pericardial effusion. Mitral Valve: The mitral valve is degenerative in appearance. There is mild thickening of the mitral valve leaflet(s). There is mild calcification of the mitral valve leaflet(s). Mild to moderate mitral annular calcification. Mild mitral valve regurgitation. MV peak gradient, 6.2 mmHg. The mean mitral valve gradient is 2.0 mmHg. Tricuspid Valve: The tricuspid valve is grossly normal. Tricuspid valve regurgitation is mild. Aortic Valve: The aortic valve has an indeterminant number of cusps. There is moderate  calcification of the aortic valve. Aortic valve regurgitation is moderate to severe. Aortic regurgitation PHT measures 210 msec. Moderate aortic stenosis is present. Aortic valve mean gradient measures 13.0 mmHg. Aortic valve peak gradient measures 25.2 mmHg. Aortic valve area, by VTI measures 1.37 cm. Pulmonic Valve: The pulmonic valve was not well visualized. Pulmonic valve regurgitation is trivial. Aorta: The aortic root is normal in size and structure. Venous: The inferior vena cava is normal in size with greater than 50% respiratory variability, suggesting right atrial pressure of 3 mmHg. IAS/Shunts: No atrial level shunt detected by color flow Doppler.  LEFT VENTRICLE PLAX 2D LVIDd:         4.90 cm     Diastology LVIDs:         4.40 cm     LV e' medial:    4.20 cm/s LV PW:         1.20 cm     LV E/e' medial:  23.6 LV IVS:        1.30 cm     LV e' lateral:   6.38 cm/s LVOT diam:     2.00 cm     LV E/e' lateral: 15.5 LV SV:         79 LV SV Index:   45 LVOT Area:     3.14 cm  LV Volumes (MOD) LV vol d, MOD A2C: 68.4 ml LV vol d, MOD A4C: 94.3 ml LV vol s, MOD A2C: 51.5 ml LV vol s, MOD A4C: 56.9 ml LV SV MOD A2C:     16.9 ml LV SV MOD A4C:     94.3 ml LV SV MOD BP:      24.9 ml RIGHT VENTRICLE RV Basal diam:  2.95 cm RV Mid diam:    2.10 cm RV S prime:     13.20 cm/s TAPSE (M-mode): 2.8 cm LEFT ATRIUM             Index        RIGHT ATRIUM           Index LA diam:  3.50 cm 1.99 cm/m   RA Area:     16.50 cm LA Vol (A2C):   94.2 ml 53.50 ml/m  RA Volume:   46.60 ml  26.46 ml/m LA Vol (A4C):   71.0 ml 40.32 ml/m LA Biplane Vol: 83.7 ml 47.53 ml/m  AORTIC VALVE                     PULMONIC VALVE AV Area (Vmax):    1.39 cm      PV Vmax:       0.65 m/s AV Area (Vmean):   1.16 cm      PV Peak grad:  1.7 mmHg AV Area (VTI):     1.37 cm AV Vmax:           251.00 cm/s AV Vmean:          166.500 cm/s AV VTI:            0.578 m AV Peak Grad:      25.2 mmHg AV Mean Grad:      13.0 mmHg LVOT Vmax:          111.00 cm/s LVOT Vmean:        61.500 cm/s LVOT VTI:          0.252 m LVOT/AV VTI ratio: 0.44 AI PHT:            210 msec  AORTA Ao Root diam: 2.80 cm Ao Asc diam:  2.80 cm MITRAL VALVE                TRICUSPID VALVE MV Area (PHT): 3.60 cm     TR Peak grad:   14.0 mmHg MV Area VTI:   3.07 cm     TR Vmax:        187.00 cm/s MV Peak grad:  6.2 mmHg MV Mean grad:  2.0 mmHg     SHUNTS MV Vmax:       1.25 m/s     Systemic VTI:  0.25 m MV Vmean:      69.5 cm/s    Systemic Diam: 2.00 cm MV Decel Time: 211 msec MV E velocity: 99.05 cm/s MV A velocity: 102.20 cm/s MV E/A ratio:  0.97 Rozann Lesches MD Electronically signed by Rozann Lesches MD Signature Date/Time: 03/06/2021/11:17:39 AM    Final     SIGNED: Deatra James, MD, FHM. [facility],  Pager (please use [email or web address] to page/text) Please use Epic Secure Chat for non-urgent communication (7AM-7PM)  If 7PM-7AM, please contact night-coverage [email or web address], 03/06/2021, 11:35 AM

## 2021-03-06 NOTE — Assessment & Plan Note (Signed)
-   stable now  -Resuming  lisinopril, metoprolol, Norvasc, tamsulosin

## 2021-03-06 NOTE — Assessment & Plan Note (Addendum)
124 > 127.  EKG with old diffuse T wave abnormalities.  - EDP talked to cardiology, Dr. Debara Pickett, recommended diuresis echo. -Troponin remained flat, reported no further chest pain or shortness of breath -Continue aspirin, Plavix -Status post evaluation by cardiology, follow-up as an outpatient

## 2021-03-07 DIAGNOSIS — Z7902 Long term (current) use of antithrombotics/antiplatelets: Secondary | ICD-10-CM | POA: Diagnosis not present

## 2021-03-07 DIAGNOSIS — I5021 Acute systolic (congestive) heart failure: Secondary | ICD-10-CM

## 2021-03-07 DIAGNOSIS — I350 Nonrheumatic aortic (valve) stenosis: Secondary | ICD-10-CM | POA: Diagnosis not present

## 2021-03-07 DIAGNOSIS — I110 Hypertensive heart disease with heart failure: Secondary | ICD-10-CM | POA: Diagnosis present

## 2021-03-07 DIAGNOSIS — I2510 Atherosclerotic heart disease of native coronary artery without angina pectoris: Secondary | ICD-10-CM | POA: Diagnosis present

## 2021-03-07 DIAGNOSIS — R0602 Shortness of breath: Secondary | ICD-10-CM | POA: Diagnosis present

## 2021-03-07 DIAGNOSIS — I5031 Acute diastolic (congestive) heart failure: Secondary | ICD-10-CM

## 2021-03-07 DIAGNOSIS — Z87891 Personal history of nicotine dependence: Secondary | ICD-10-CM | POA: Diagnosis not present

## 2021-03-07 DIAGNOSIS — Z66 Do not resuscitate: Secondary | ICD-10-CM | POA: Diagnosis present

## 2021-03-07 DIAGNOSIS — R778 Other specified abnormalities of plasma proteins: Secondary | ICD-10-CM | POA: Diagnosis present

## 2021-03-07 DIAGNOSIS — R079 Chest pain, unspecified: Secondary | ICD-10-CM | POA: Diagnosis present

## 2021-03-07 DIAGNOSIS — E785 Hyperlipidemia, unspecified: Secondary | ICD-10-CM | POA: Diagnosis present

## 2021-03-07 DIAGNOSIS — Z7982 Long term (current) use of aspirin: Secondary | ICD-10-CM | POA: Diagnosis not present

## 2021-03-07 DIAGNOSIS — Z79899 Other long term (current) drug therapy: Secondary | ICD-10-CM | POA: Diagnosis not present

## 2021-03-07 DIAGNOSIS — Z8249 Family history of ischemic heart disease and other diseases of the circulatory system: Secondary | ICD-10-CM | POA: Diagnosis not present

## 2021-03-07 DIAGNOSIS — I6523 Occlusion and stenosis of bilateral carotid arteries: Secondary | ICD-10-CM | POA: Diagnosis not present

## 2021-03-07 DIAGNOSIS — I083 Combined rheumatic disorders of mitral, aortic and tricuspid valves: Secondary | ICD-10-CM | POA: Diagnosis present

## 2021-03-07 DIAGNOSIS — I5030 Unspecified diastolic (congestive) heart failure: Secondary | ICD-10-CM | POA: Diagnosis not present

## 2021-03-07 DIAGNOSIS — E119 Type 2 diabetes mellitus without complications: Secondary | ICD-10-CM | POA: Diagnosis present

## 2021-03-07 DIAGNOSIS — Z20822 Contact with and (suspected) exposure to covid-19: Secondary | ICD-10-CM | POA: Diagnosis present

## 2021-03-07 LAB — BASIC METABOLIC PANEL
Anion gap: 6 (ref 5–15)
BUN: 18 mg/dL (ref 8–23)
CO2: 29 mmol/L (ref 22–32)
Calcium: 8.9 mg/dL (ref 8.9–10.3)
Chloride: 105 mmol/L (ref 98–111)
Creatinine, Ser: 1.26 mg/dL — ABNORMAL HIGH (ref 0.61–1.24)
GFR, Estimated: 53 mL/min — ABNORMAL LOW (ref >60)
Glucose, Bld: 90 mg/dL (ref 70–99)
Potassium: 3.8 mmol/L (ref 3.5–5.1)
Sodium: 140 mmol/L (ref 135–145)

## 2021-03-07 LAB — BRAIN NATRIURETIC PEPTIDE: B Natriuretic Peptide: 314 pg/mL — ABNORMAL HIGH (ref 0.0–100.0)

## 2021-03-07 MED ORDER — CLOPIDOGREL BISULFATE 75 MG PO TABS
75.0000 mg | ORAL_TABLET | Freq: Every day | ORAL | Status: DC
  Administered 2021-03-07: 75 mg via ORAL
  Filled 2021-03-07: qty 1

## 2021-03-07 MED ORDER — ATORVASTATIN CALCIUM 10 MG PO TABS
10.0000 mg | ORAL_TABLET | Freq: Every day | ORAL | Status: DC
  Administered 2021-03-07: 10 mg via ORAL
  Filled 2021-03-07: qty 1

## 2021-03-07 MED ORDER — ASPIRIN EC 81 MG PO TBEC
81.0000 mg | DELAYED_RELEASE_TABLET | Freq: Every day | ORAL | Status: DC
  Administered 2021-03-07: 81 mg via ORAL
  Filled 2021-03-07: qty 1

## 2021-03-07 MED ORDER — METOPROLOL SUCCINATE ER 50 MG PO TB24: 50.0000 mg | ORAL_TABLET | Freq: Every day | ORAL | 1 refills | Status: DC

## 2021-03-07 MED ORDER — METOPROLOL SUCCINATE ER 50 MG PO TB24
50.0000 mg | ORAL_TABLET | Freq: Every day | ORAL | Status: DC
  Administered 2021-03-07: 50 mg via ORAL
  Filled 2021-03-07: qty 1

## 2021-03-07 MED ORDER — ISOSORBIDE MONONITRATE ER 30 MG PO TB24
15.0000 mg | ORAL_TABLET | Freq: Every day | ORAL | Status: DC
  Administered 2021-03-07: 15 mg via ORAL
  Filled 2021-03-07: qty 1

## 2021-03-07 MED ORDER — ISOSORBIDE MONONITRATE ER 30 MG PO TB24: 15.0000 mg | ORAL_TABLET | Freq: Every day | ORAL | 1 refills | Status: DC

## 2021-03-07 MED ORDER — SENNOSIDES-DOCUSATE SODIUM 8.6-50 MG PO TABS: 1.0000 | ORAL_TABLET | Freq: Two times a day (BID) | ORAL | 0 refills | Status: AC

## 2021-03-07 MED ORDER — HYDRALAZINE HCL 10 MG PO TABS: 10.0000 mg | ORAL_TABLET | Freq: Two times a day (BID) | ORAL | 0 refills | Status: DC

## 2021-03-07 MED ORDER — FUROSEMIDE 20 MG PO TABS
20.0000 mg | ORAL_TABLET | Freq: Every day | ORAL | Status: DC
  Administered 2021-03-07: 20 mg via ORAL
  Filled 2021-03-07: qty 1

## 2021-03-07 MED ORDER — AMLODIPINE BESYLATE 5 MG PO TABS: 2.5000 mg | ORAL_TABLET | Freq: Every day | ORAL | Status: DC

## 2021-03-07 MED ORDER — HYDRALAZINE HCL 10 MG PO TABS: 10.0000 mg | ORAL_TABLET | Freq: Two times a day (BID) | ORAL | Status: DC

## 2021-03-07 MED ORDER — FUROSEMIDE 20 MG PO TABS: 20.0000 mg | ORAL_TABLET | Freq: Every day | ORAL | 0 refills | Status: DC

## 2021-03-07 MED ORDER — METOPROLOL TARTRATE 25 MG PO TABS: 25.0000 mg | ORAL_TABLET | Freq: Two times a day (BID) | ORAL | Status: DC

## 2021-03-07 NOTE — TOC Initial Note (Signed)
Transition of Care [facility]) - Initial/Assessment Note    Patient Details  Name: Charles Lester MRN: [id number] Date of Birth: Mar 17, 1924  [facility]) CM/SW Contact:    Salome Arnt, LCSW Phone Number: 03/07/2021, 10:50 AM  Clinical Narrative: TOC received consult for CHF screening. Pt reports he lives with his wife and they manage as well as they can at home. He indicates his wife needs more assistance than he does. Pt still drives. He is requesting home health services with no preference on agency. Referred and accepted by Marjory Lies with [place]. CHF consult completed. Discussed importance of daily weights and heart healthy diet. However, pt stopped discussion and said he doesn't have CHF diagnosis. LCSW notified MD for follow up.                    Expected Discharge Plan: [place] Barriers to Discharge: Barriers Resolved   Patient Goals and CMS Choice Patient states their goals for this hospitalization and ongoing recovery are:: return home   Choice offered to / list presented to : Patient  Expected Discharge Plan and Services Expected Discharge Plan: [place] In-house Referral: Clinical Social Work   Post Acute Care Choice: [place] arrangements for the past 2 months: [place] Expected Discharge Date: 03/07/21                         HH Arranged: RN, PT HH Agency: [facility] Date [facility] Agency Contacted: 03/07/21 Time [place]: 1050 Representative spoke with at [facility] [place]: Marjory Lies  Prior Living Arrangements/Services Living arrangements for the past 2 months: [place] with:: Spouse Patient language and need for interpreter reviewed:: Yes Do you feel safe going back to the place where you live?: Yes      Need for Family Participation in Patient Care: No (Comment)   Current home services: DME (cane, walker) Criminal Activity/Legal Involvement Pertinent to  Current Situation/Hospitalization: No - Comment as needed  Activities of Daily Living      Permission Sought/Granted   Permission granted to share information with : Yes, Verbal Permission Granted     Permission granted to share info w AGENCY: [place] granted to share info w Relationship: Home health     Emotional Assessment   Attitude/Demeanor/Rapport: Engaged Affect (typically observed): Accepting Orientation: : Oriented to Self, Oriented to Place, Oriented to  Time, Oriented to Situation Alcohol / Substance Use: Not Applicable Psych Involvement: No (comment)  Admission diagnosis:  Shortness of breath [R06.02] Acute pulmonary edema (HCC) [J81.0] Pleural effusion, bilateral [J90] Chest pain [R07.9] Patient Active Problem List   Diagnosis Date Noted   DMII (diabetes mellitus, type 2) ([place]) 03/06/2021    Class: Chronic   HTN (hypertension) 03/06/2021    Class: Chronic   Diastolic congestive heart failure ([place]) 03/06/2021   Elevated PSA 03/25/2019   Atherosclerosis of native arteries of the extremities with intermittent claudication 06/19/2012   Peripheral vascular disease ([place]) 03/08/2011   MIXED HYPERLIPIDEMIA 03/06/2008   CORONARY ATHEROSCLEROSIS NATIVE CORONARY ARTERY 03/06/2008   Carotid artery stenosis 03/06/2008   PCP:  [name], MD Pharmacy:   [facility] [address] [place] [address] Phone: [phone] Fax: [phone]     Social Determinants of Health (SDOH) Interventions    Readmission Risk Interventions No flowsheet data found.

## 2021-03-07 NOTE — Consult Note (Signed)
Cardiology Consultation:   Patient ID: Charles Lester MRN: [id number]; DOB: Jul 15, 1924  Admit date: 03/05/2021 Date of Consult: 03/07/2021  PCP:  [name], MD   [facility] Providers Cardiologist:  Rozann Lesches, MD        Patient Profile:   Charles Lester is a 88 y.o. male with a hx of mild to mod AS who is being seen 03/07/2021 for the evaluation of CHF at the request of Dr. Roger Shelter.  History of Present Illness:   Charles Lester is a 88 yo male patient with history of HTN, DM2, PVD, CAD with abn NST 2010-inf infarct but never had cath.   Patient is now admitted with worsening dyspnea, orthopnea, no chest pain, CHF BNP 540, echo yest new LVD EF 30-35%, mod AS, mod-severe AI. Troponin 124, 127,130, EKG NSR PVC's poor R wave progression, TWI lateral. Patient is very active, still drives, walks regularly around [place], mows lawn. He says shortness of breath came on suddenly the night prior to admission. No swelling or chest pain. Wants medical therapy. Currently sitting in chair and feels well without complaints.   Past Medical History:  Diagnosis Date   Aortic stenosis    Mild to moderate 2017   Arthritis    CAD (coronary artery disease)    Essential hypertension    Hyperlipidemia    Ischemic heart disease    Based on Myoview 2010 revealing inferolateral scar but no active ischemia   PAD (peripheral artery disease) (HCC)    Type 2 diabetes mellitus (HCC)     Past Surgical History:  Procedure Laterality Date   NO PAST SURGERIES       Home Medications:  Prior to Admission medications   Medication Sig Start Date End Date Taking? Authorizing Provider  amLODipine (NORVASC) 2.5 MG tablet Take 1 tablet (2.5 mg total) by mouth daily. 10/22/20  Yes Verta Ellen., NP  aspirin EC 81 MG tablet Take 1 tablet (81 mg total) by mouth daily. 10/22/20  Yes Verta Ellen., NP  atorvastatin (LIPITOR) 20 MG tablet Take 1 tablet (20 mg total) by mouth daily. 10/22/20  Yes Verta Ellen., NP  Cholecalciferol (VITAMIN D3) 10 MCG (400 UNIT) CAPS Take 1 capsule by mouth daily.   Yes [provider]  clopidogrel (PLAVIX) 75 MG tablet Take 1 tablet (75 mg total) by mouth daily. 10/22/20  Yes Verta Ellen., NP  ferrous sulfate 325 (65 FE) MG tablet Take 325 mg by mouth daily with breakfast.   Yes [provider]  finasteride (PROSCAR) 5 MG tablet Take 1 tablet by mouth daily. 06/11/12  Yes [provider]  lisinopril (ZESTRIL) 40 MG tablet Take 1 tablet (40 mg total) by mouth daily. 10/22/20  Yes Verta Ellen., NP  metoprolol tartrate (LOPRESSOR) 25 MG tablet Take 1 tablet (25 mg total) by mouth 2 (two) times daily. 10/22/20  Yes Verta Ellen., NP  niacin (NIASPAN) 1000 MG CR tablet Take 1,000 mg by mouth daily. 03/27/19  Yes [provider]  Omega-3 Fatty Acids (FISH OIL) 1000 MG CAPS Take by mouth.   Yes [provider]  tamsulosin (FLOMAX) 0.4 MG CAPS Take 1 capsule by mouth in the morning and at bedtime. 06/01/12  Yes [provider]  vitamin C (ASCORBIC ACID) 500 MG tablet Take 500 mg by mouth daily.   Yes [provider]    Inpatient Medications: Scheduled Meds:  enoxaparin (LOVENOX) injection  40 mg Subcutaneous Q24H   furosemide  20 mg Intravenous Q12H   senna-docusate  1 tablet Oral BID   Continuous Infusions:  PRN Meds: acetaminophen **OR** acetaminophen, ondansetron **OR** ondansetron (ZOFRAN) IV  Allergies:   No Known Allergies  Social History:   Social History   Socioeconomic History   Marital status: Married    Spouse name: Not on file   Number of children: Not on file   Years of education: Not on file   Highest education level: Not on file  Occupational History   Occupation: Retired  Tobacco Use   Smoking status: Former    Types: Cigarettes    Quit date: 02/10/2005    Years since quitting: 16.0   Smokeless tobacco: Never  Vaping Use   Vaping Use: Never used   Substance and Sexual Activity   Alcohol use: No   Drug use: No   Sexual activity: Not Currently    Partners: Female  Other Topics Concern   Not on file  Social History Narrative   No regular exercise   Social Determinants of Health   Financial Resource Strain: Not on file  Food Insecurity: Not on file  Transportation Needs: Not on file  Physical Activity: Not on file  Stress: Not on file  Social Connections: Not on file  Intimate Partner Violence: Not on file    Family History:     Family History  Problem Relation Age of Onset   Hypertension Father    Hypertension Sister    Hypertension Brother    Coronary artery disease Neg Hx      ROS:  Please see the history of present illness.  Review of Systems  Constitutional: Negative.  HENT: Negative.    Cardiovascular: Negative.   Respiratory: Negative.    Endocrine: Negative.   Hematologic/Lymphatic: Negative.   Musculoskeletal: Negative.   Gastrointestinal: Negative.   Genitourinary: Negative.   Neurological: Negative.    All other ROS reviewed and negative.     Physical Exam/Data:   Vitals:   03/06/21 0501 03/06/21 1456 03/06/21 2030 03/07/21 0550  BP: (!) 123/57 (!) 148/65 137/64 129/60  Pulse: 77 81 82 80  Resp: 18 18 18 19   Temp: 98 F (36.7 C) 98 F (36.7 C) 98.2 F (36.8 C) 97.9 F (36.6 C)  TempSrc:  Oral Oral Oral  SpO2: 99% 100% 98% 98%  Weight: 65.5 kg   62.6 kg  Height:    5\' 7"  (1.702 m)    Intake/Output Summary (Last 24 hours) at 03/07/2021 0755 Last data filed at 03/06/2021 1700 Gross per 24 hour  Intake 720 ml  Output --  Net 720 ml   Last 3 Weights 03/07/2021 03/06/2021 10/22/2020  Weight (lbs) 138 lb 1.6 oz 144 lb 6.4 oz 144 lb  Weight (kg) 62.642 kg 65.5 kg 65.318 kg     Body mass index is 21.63 kg/m.  General:  Thin, looks young, in no acute distress  Neck: no JVD Vascular: No carotid bruits; Distal pulses 2+ bilaterally Cardiac:  normal S1, S2; RRR; 3/6 systolic murmur  RSB/LSB Lungs:  cdecreased breath sounds right lung base, clear on left, no rales Abd: soft, nontender, no hepatomegaly  Ext: no edema Musculoskeletal:  No deformities, BUE and BLE strength normal and equal Skin: warm and dry  Neuro:  CNs 2-12 intact, no focal abnormalities noted Psych:  Normal affect   EKG:  The EKG was personally reviewed and demonstrates:   NSR, bigeminy, poor ant R wave progression,  lat TWI similar to prior EKG's Telemetry:  Telemetry was personally reviewed and demonstrates:  NSR with 6 beat NSVT  Relevant CV Studies:  NST 05/20/08  IMPRESSION:  Abnormal combined exercise and pharmacologic stress nuclear  myocardial study revealing impaired exercise capacity, chronotropic  incompetence, borderline left ventricular enlargement and the  mildly impaired LV systolic function in a segmental pattern.  There  was a moderate sized area of inferolateral infarction without  scintigraphic or electrocardiographic evidence for myocardial  ischemia.  Other findings as noted.   Echo 2019 Study Conclusions   - Left ventricle: The cavity size was normal. Wall thickness was    normal. Systolic function was normal. The estimated ejection    fraction was in the range of 55% to 60%. Wall motion was normal;    there were no regional wall motion abnormalities. Doppler    parameters are consistent with abnormal left ventricular    relaxation (grade 1 diastolic dysfunction).  - Aortic valve: Mildly calcified annulus. Mildly thickened    leaflets. There was mild stenosis. There was mild to moderate    regurgitation. Mean gradient (S): 11 mm Hg. Valve area (VTI):    1.51 cm^2. Valve area (Vmax): 1.57 cm^2. Valve area (Vmean): 1.52    cm^2.  - Mitral valve: Mildly calcified annulus. Mildly thickened leaflets    .  - Left atrium: The atrium was moderately dilated.  - Technically adequate study.   Laboratory Data:  High Sensitivity Troponin:   Recent Labs  Lab 03/05/21 1303  03/05/21 1451 03/05/21 2118  TROPONINIHS 124* 127* 130*     Chemistry Recent Labs  Lab 03/05/21 1303 03/06/21 0452 03/07/21 0433  NA 138 141 140  K 4.2 3.7 3.8  CL 107 107 105  CO2 26 28 29   GLUCOSE 150* 124* 90  BUN 17 17 18   CREATININE 1.14 1.09 1.26*  CALCIUM 9.0 8.9 8.9  MG 1.8  --   --   GFRNONAA 60* >60 53*  ANIONGAP 5 6 6     Recent Labs  Lab 03/05/21 1303  PROT 6.3*  ALBUMIN 3.2*  AST 27  ALT 24  ALKPHOS 65  BILITOT 0.9   Lipids No results for input(s): CHOL, TRIG, HDL, LABVLDL, LDLCALC, CHOLHDL in the last 168 hours.  Hematology Recent Labs  Lab 03/05/21 1303  WBC 4.2  RBC 3.14*  HGB 10.0*  HCT 29.3*  MCV 93.3  MCH 31.8  MCHC 34.1  RDW 14.1  PLT 117*   Thyroid No results for input(s): TSH, FREET4 in the last 168 hours.  BNP Recent Labs  Lab 03/05/21 1303 03/07/21 0433  BNP 540.0* 314.0*    DDimer No results for input(s): DDIMER in the last 168 hours.   Radiology/Studies:  CT Angio Chest PE W and/or Wo Contrast  Result Date: 03/05/2021 CLINICAL DATA:  Shortness of breath. EXAM: CT ANGIOGRAPHY CHEST WITH CONTRAST TECHNIQUE: Multidetector CT imaging of the chest was performed using the standard protocol during bolus administration of intravenous contrast. Multiplanar CT image reconstructions and MIPs were obtained to evaluate the vascular anatomy. RADIATION DOSE REDUCTION: This exam was performed according to the departmental dose-optimization program which includes automated exposure control, adjustment of the mA and/or kV according to patient size and/or use of iterative reconstruction technique. CONTRAST:  50mL OMNIPAQUE IOHEXOL 350 MG/ML SOLN COMPARISON:  None. FINDINGS: Cardiovascular: Satisfactory opacification of the pulmonary arteries to the segmental level. No evidence of pulmonary embolism. Normal heart size. No pericardial effusion. Thoracic aortic atherosclerosis. Coronary artery  atherosclerosis. Mediastinum/Nodes: No enlarged mediastinal,  hilar, or axillary lymph nodes. Thyroid gland, trachea, and esophagus demonstrate no significant findings. Lungs/Pleura: Small bilateral pleural effusions. Bilateral mild interstitial thickening in the upper and lower lobes. Focal area of interstitial thickening and ground-glass opacities at the lung apices. No pneumothorax. Upper Abdomen: No acute abnormality. Musculoskeletal: No acute osseous abnormality. No aggressive osseous lesion. Generalized osteopenia. Review of the MIP images confirms the above findings. IMPRESSION: 1. No pulmonary embolism. 2. Small bilateral pleural effusions with mild interstitial thickening in the upper and lower lobes, suggestive of pulmonary edema. 3. Focal area of interstitial thickening and ground-glass opacities at the lung apices concerning for atypical infection versus related to pulmonary edema. 4. Aortic Atherosclerosis (ICD10-I70.0). Electronically Signed   By: Kathreen Devoid M.D.   On: 03/05/2021 15:34   DG Chest Portable 1 View  Result Date: 03/05/2021 CLINICAL DATA:  Shortness of breath EXAM: PORTABLE CHEST 1 VIEW COMPARISON:  No recent examination available for comparison FINDINGS: The heart is normal in size. Atherosclerotic calcification of the aortic arch. Bilateral lower lobe opacities, right worse than the left concerning for airspace disease. Right upper lobe opacity, likely scarring. IMPRESSION: Bilateral lower lobe reticulonodular and hazy opacities, right worse than the left, concerning for pneumonia. Follow-up examination to resolution is recommended. Electronically Signed   By: Keane Police D.O.   On: 03/05/2021 13:07   ECHOCARDIOGRAM COMPLETE  Result Date: 03/06/2021    ECHOCARDIOGRAM REPORT   Patient Name:   Charles Lester Date of Exam: 03/06/2021 Medical Rec #:  [id number]      Height:       67.0 in Accession #:    [id number]     Weight:       144.4 lb Date of Birth:  09-13-1924      BSA:          1.761 m Patient Age:    47 years       BP:           123/57  mmHg Patient Gender: M              HR:           75 bpm. Exam Location:  [facility] Procedure: 2D Echo, Cardiac Doppler and Color Doppler Indications:    Pulmonary edema  History:        Patient has prior history of Echocardiogram examinations, most                 recent 03/07/2017. CHF, CAD; Risk Factors:Hypertension,                 Dyslipidemia and Former Smoker.  Sonographer:    Wenda Low Referring Phys: [place]  1. Left ventricular ejection fraction, by estimation, is 30 to 35%. The left ventricle has moderately decreased function. The left ventricle demonstrates regional wall motion abnormalities (see scoring diagram/findings for description). There is mild left ventricular hypertrophy. Left ventricular diastolic parameters are consistent with Grade I diastolic dysfunction (impaired relaxation). Elevated left ventricular end-diastolic pressure.  2. Right ventricular systolic function is normal. The right ventricular size is normal. There is normal pulmonary artery systolic pressure. The estimated right ventricular systolic pressure is 06.2 mmHg.  3. Left atrial size was moderately dilated.  4. The mitral valve is degenerative. Mild mitral valve regurgitation.  5. The aortic valve has an indeterminant number of cusps. There is moderate calcification of the aortic valve. Aortic valve regurgitation is moderate to  severe. Moderate aortic valve stenosis with low gradient. Aortic regurgitation PHT measures 210 msec. Aortic valve area, by VTI measures 1.37 cm. Aortic valve mean gradient measures 13.0 mmHg. Dimentionless index 0.44.  6. The inferior vena cava is normal in size with greater than 50% respiratory variability, suggesting right atrial pressure of 3 mmHg. Comparison(s): Prior images reviewed side by side. LVEF has decreased, now 30-35% range. Aortic stenosis is moderate range and aortic regurgitation is moderate to severe. FINDINGS  Left Ventricle: Left ventricular  ejection fraction, by estimation, is 30 to 35%. The left ventricle has moderately decreased function. The left ventricle demonstrates regional wall motion abnormalities. The left ventricular internal cavity size was normal in size. There is mild left ventricular hypertrophy. Left ventricular diastolic parameters are consistent with Grade I diastolic dysfunction (impaired relaxation). Elevated left ventricular end-diastolic pressure.  LV Wall Scoring: The antero-lateral wall and posterior wall are akinetic. The anterior septum, inferior wall, apical lateral segment, and apex are hypokinetic. The entire anterior wall, inferior septum, and apical inferior segment are normal. Right Ventricle: The right ventricular size is normal. No increase in right ventricular wall thickness. Right ventricular systolic function is normal. There is normal pulmonary artery systolic pressure. The tricuspid regurgitant velocity is 1.87 m/s, and  with an assumed right atrial pressure of 3 mmHg, the estimated right ventricular systolic pressure is 73.5 mmHg. Left Atrium: Left atrial size was moderately dilated. Right Atrium: Right atrial size was normal in size. Pericardium: There is no evidence of pericardial effusion. Mitral Valve: The mitral valve is degenerative in appearance. There is mild thickening of the mitral valve leaflet(s). There is mild calcification of the mitral valve leaflet(s). Mild to moderate mitral annular calcification. Mild mitral valve regurgitation. MV peak gradient, 6.2 mmHg. The mean mitral valve gradient is 2.0 mmHg. Tricuspid Valve: The tricuspid valve is grossly normal. Tricuspid valve regurgitation is mild. Aortic Valve: The aortic valve has an indeterminant number of cusps. There is moderate calcification of the aortic valve. Aortic valve regurgitation is moderate to severe. Aortic regurgitation PHT measures 210 msec. Moderate aortic stenosis is present. Aortic valve mean gradient measures 13.0 mmHg. Aortic  valve peak gradient measures 25.2 mmHg. Aortic valve area, by VTI measures 1.37 cm. Pulmonic Valve: The pulmonic valve was not well visualized. Pulmonic valve regurgitation is trivial. Aorta: The aortic root is normal in size and structure. Venous: The inferior vena cava is normal in size with greater than 50% respiratory variability, suggesting right atrial pressure of 3 mmHg. IAS/Shunts: No atrial level shunt detected by color flow Doppler.  LEFT VENTRICLE PLAX 2D LVIDd:         4.90 cm     Diastology LVIDs:         4.40 cm     LV e' medial:    4.20 cm/s LV PW:         1.20 cm     LV E/e' medial:  23.6 LV IVS:        1.30 cm     LV e' lateral:   6.38 cm/s LVOT diam:     2.00 cm     LV E/e' lateral: 15.5 LV SV:         79 LV SV Index:   45 LVOT Area:     3.14 cm  LV Volumes (MOD) LV vol d, MOD A2C: 68.4 ml LV vol d, MOD A4C: 94.3 ml LV vol s, MOD A2C: 51.5 ml LV vol s, MOD A4C: 56.9 ml LV  SV MOD A2C:     16.9 ml LV SV MOD A4C:     94.3 ml LV SV MOD BP:      24.9 ml RIGHT VENTRICLE RV Basal diam:  2.95 cm RV Mid diam:    2.10 cm RV S prime:     13.20 cm/s TAPSE (M-mode): 2.8 cm LEFT ATRIUM             Index        RIGHT ATRIUM           Index LA diam:        3.50 cm 1.99 cm/m   RA Area:     16.50 cm LA Vol (A2C):   94.2 ml 53.50 ml/m  RA Volume:   46.60 ml  26.46 ml/m LA Vol (A4C):   71.0 ml 40.32 ml/m LA Biplane Vol: 83.7 ml 47.53 ml/m  AORTIC VALVE                     PULMONIC VALVE AV Area (Vmax):    1.39 cm      PV Vmax:       0.65 m/s AV Area (Vmean):   1.16 cm      PV Peak grad:  1.7 mmHg AV Area (VTI):     1.37 cm AV Vmax:           251.00 cm/s AV Vmean:          166.500 cm/s AV VTI:            0.578 m AV Peak Grad:      25.2 mmHg AV Mean Grad:      13.0 mmHg LVOT Vmax:         111.00 cm/s LVOT Vmean:        61.500 cm/s LVOT VTI:          0.252 m LVOT/AV VTI ratio: 0.44 AI PHT:            210 msec  AORTA Ao Root diam: 2.80 cm Ao Asc diam:  2.80 cm MITRAL VALVE                TRICUSPID VALVE MV Area  (PHT): 3.60 cm     TR Peak grad:   14.0 mmHg MV Area VTI:   3.07 cm     TR Vmax:        187.00 cm/s MV Peak grad:  6.2 mmHg MV Mean grad:  2.0 mmHg     SHUNTS MV Vmax:       1.25 m/s     Systemic VTI:  0.25 m MV Vmean:      69.5 cm/s    Systemic Diam: 2.00 cm MV Decel Time: 211 msec MV E velocity: 99.05 cm/s MV A velocity: 102.20 cm/s MV E/A ratio:  0.97 Rozann Lesches MD Electronically signed by Rozann Lesches MD Signature Date/Time: 03/06/2021/11:17:39 AM    Final      Assessment and Plan:   Acute CHF echo yest with new LVD EF 30-35%, grade 1 DD, mod AS, mod-severe AI I/O's negative 900 cc on lasix 40 mg IV bid- now on 20 mg IV bid, Crt up 1.26 today. No history of chest pain and given age he wants medical therapy. Was on ASA/Plavix, lipitor metoprolol 25 mg bid, amlodipine 2.5 mg daily, lisinopril 40 mg daily. Will resume all except lisinopril with increased Crt. Consider coreg over metoprolol, entresto if renal improves, transition lasix to po 20 mg  once daily. 2 gm sodium diet(patient eats a lot of salt) f/u in office  Mod AS/mod-severe AI  History of CAD with abn NST 2010-old inferolateral infarct without ischemia, elevated troponins in setting of CHF and new LVD. WMA on echo. Will treat medically.  NSVT- 6 beats longest asymptomatic  HTN BP stable.  DM2  HLD on lipitor-no recent lipids   Risk Assessment/Risk Scores:        New York Heart Association (NYHA) Functional Class NYHA Class IV        For questions or updates, please contact [facility] Please consult [email or web address] for contact info under    Signed, Ermalinda Barrios, PA-C  03/07/2021 7:55 AM

## 2021-03-07 NOTE — Plan of Care (Signed)
°  Problem: Education: Goal: Knowledge of General Education information will improve Description: Including pain rating scale, medication(s)/side effects and non-pharmacologic comfort measures Outcome: Adequate for Discharge   Problem: Health Behavior/Discharge Planning: Goal: Ability to manage health-related needs will improve Outcome: Adequate for Discharge   Problem: Clinical Measurements: Goal: Ability to maintain clinical measurements within normal limits will improve Outcome: Adequate for Discharge Goal: Will remain free from infection Outcome: Adequate for Discharge Goal: Diagnostic test results will improve Outcome: Adequate for Discharge Goal: Respiratory complications will improve Outcome: Adequate for Discharge Goal: Cardiovascular complication will be avoided Outcome: Adequate for Discharge   Problem: Activity: Goal: Risk for activity intolerance will decrease Outcome: Adequate for Discharge   Problem: Nutrition: Goal: Adequate nutrition will be maintained Outcome: Adequate for Discharge   Problem: Coping: Goal: Level of anxiety will decrease Outcome: Adequate for Discharge   Problem: Elimination: Goal: Will not experience complications related to bowel motility Outcome: Adequate for Discharge Goal: Will not experience complications related to urinary retention Outcome: Adequate for Discharge   Problem: Pain Managment: Goal: General experience of comfort will improve Outcome: Adequate for Discharge   Problem: Safety: Goal: Ability to remain free from injury will improve Outcome: Adequate for Discharge   Problem: Skin Integrity: Goal: Risk for impaired skin integrity will decrease Outcome: Adequate for Discharge   Problem: Education: Goal: Ability to demonstrate management of disease process will improve Outcome: Adequate for Discharge Goal: Ability to verbalize understanding of medication therapies will improve Outcome: Adequate for Discharge Goal:  Individualized Educational Video(s) Outcome: Adequate for Discharge   Problem: Activity: Goal: Capacity to carry out activities will improve Outcome: Adequate for Discharge   Problem: Cardiac: Goal: Ability to achieve and maintain adequate cardiopulmonary perfusion will improve Outcome: Adequate for Discharge   Problem: Education: Goal: Ability to demonstrate management of disease process will improve Outcome: Adequate for Discharge Goal: Ability to verbalize understanding of medication therapies will improve Outcome: Adequate for Discharge Goal: Individualized Educational Video(s) Outcome: Adequate for Discharge   Problem: Activity: Goal: Capacity to carry out activities will improve Outcome: Adequate for Discharge   Problem: Cardiac: Goal: Ability to achieve and maintain adequate cardiopulmonary perfusion will improve Outcome: Adequate for Discharge   

## 2021-03-07 NOTE — TOC Transition Note (Signed)
Transition of Care [facility]) - CM/SW Discharge Note   Patient Details  Name: Charles Lester MRN: [id number] Date of Birth: 1924-04-03  [facility] of [facility] CM/SW Contact:  Salome Arnt, LCSW Phone Number: 03/07/2021, 10:57 AM   Clinical Narrative:  Pt d/c today with home health RN/SW with [facility]. Pt aware and agreeable. He states he is driving himself home. RN updated. [facility] aware of d/c today.      Final next level of care: Home w Home Health Services Barriers to Discharge: Barriers Resolved   Patient Goals and CMS Choice Patient states their goals for this hospitalization and ongoing recovery are:: return home   Choice offered to / list presented to : Patient  Discharge Placement                  Name of family member notified: pt only Patient and family notified of of transfer: 03/07/21  Discharge Plan and Services In-house Referral: Clinical Social Work   Post Acute Care Choice: Home Health                    HH Arranged: RN, PT [facility] Agency: [place] Date [place]: 03/07/21 Time [place]: [id number] Representative spoke with at [place]: [place] ([place]) Interventions     Readmission Risk Interventions No flowsheet data found.

## 2021-03-07 NOTE — Progress Notes (Signed)
AVS given and explained to patient. Lunch tray came, pt will let us know when done with meal.

## 2021-03-07 NOTE — Discharge Summary (Signed)
Physician Discharge Summary   Patient: Charles Lester MRN: [id number] DOB: 06-14-1924  Admit date:     03/05/2021  Discharge date: 03/07/21  Discharge Physician: Charles Lester   PCP: Charles Gravel, MD   Recommendations at discharge:   Follow-up with cardiologist within 1 week, Continue current recommended medications Follow-up with PCP 1-2 weeks  Discharge Diagnoses: Principal Problem:   Diastolic congestive heart failure ([place]) Active Problems:   DMII (diabetes mellitus, type 2) ([place])   HTN (hypertension)   CORONARY ATHEROSCLEROSIS NATIVE CORONARY ARTERY   Carotid artery stenosis   Peripheral vascular disease ([place])  Resolved Problems:   Acute CHF [facility])   Hospital Course: Per HPI: Lester Charles is a 88 y.o. male with medical history significant for diabetes mellitus, hypertension, coronary artery disease, peripheral vascular disease. Patient presented to the ED with complaints of difficulty breathing that started last night.  He reports he was unable to lie back to sleep.   No chest pain.  No lower extremity swelling.  No fevers no chills, no cough.  He denies prior episodes. He is on aspirin and Plavix and compliant.   ED course, stable vitals.  Troponin 124 > 127.>> 130 CTA chest negative for PE, shows small bilateral pleural effusions, and pulmonary edema.  Lung apices with focal thickening and groundglass opacities concerning for atypical infection versus edema. But he has been afebrile, with no leukocytosis BNP elevated at 540, IV Lasix 20 mg X 2 given.   Assessment and Plan: * Diastolic congestive heart failure ([place])- (present on admission)   Dyspnea, not hypoxic.  -  CTA chest showing small bilateral pleural effusions and -pulmonary edema.  BNP elevated at 540,  - Per charts his weight has declined over the past year.  - Last echo 2019 EF 55 to 60%, with mild to moderate aortic regurg.    -IV Lasix 40 twice daily >>> switch to p.o. 20 mg daily - Daily weight  recommended -Cardiology consulted: Recommended metoprolol succinate 50 mg daily, Imdur 50 mg daily, hydralazine 10 mg p.o. 3 times daily, Lasix 20 mg daily Will consider ACE/ARB/ARN I/MRA --discontinue lisinopril due to AKI  03/06/21 Echo: Left ventricular ejection fraction, by estimation, is 30 to 35%. The  left ventricle has moderately decreased function. The left ventricle  demonstrates regional wall motion abnormalities (see scoring  diagram/findings for description). There is mild  left ventricular hypertrophy. Left ventricular diastolic parameters are consistent with Grade I diastolic dysfunction   Cardiology has cleared the patient for discharge and follow-up as an outpatient   Acute CHF (HCC)-resolved as of 03/06/2021     DMII (diabetes mellitus, type 2) (HCC) -glucose 150.  Not on medication -Daily fasting CBG - Hgba1c  HTN (hypertension)- (present on admission) - stable now  -Resuming  lisinopril, metoprolol, Norvasc, tamsulosin  Peripheral vascular disease ([place])- (present on admission) Continue aspirin, Plavix  Carotid artery stenosis- (present on admission) Continue aspirin, Plavix  CORONARY ATHEROSCLEROSIS NATIVE CORONARY ARTERY- (present on admission) 124 > 127.  EKG with old diffuse T wave abnormalities.  - EDP talked to cardiology, Dr. Debara Pickett, recommended diuresis echo. -Troponin remained flat, reported no further chest pain or shortness of breath -Continue aspirin, Plavix -Status post evaluation by cardiology, follow-up as an outpatient    Consultants: Cardiologist Procedures performed: Echocardiogram Disposition: Home Diet recommendation:  Discharge Diet Orders (From admission, onward)     Start     Ordered   03/07/21 0000  Diet - low sodium heart healthy  03/07/21 1048           Cardiac diet  DISCHARGE MEDICATION: Allergies as of 03/07/2021   No Known Allergies      Medication List     STOP taking these medications    lisinopril 40  MG tablet Commonly known as: ZESTRIL   metoprolol tartrate 25 MG tablet Commonly known as: LOPRESSOR       TAKE these medications    amLODipine 2.5 MG tablet Commonly known as: NORVASC Take 1 tablet (2.5 mg total) by mouth daily.   aspirin EC 81 MG tablet Take 1 tablet (81 mg total) by mouth daily.   atorvastatin 20 MG tablet Commonly known as: LIPITOR Take 1 tablet (20 mg total) by mouth daily.   clopidogrel 75 MG tablet Commonly known as: PLAVIX Take 1 tablet (75 mg total) by mouth daily.   ferrous sulfate 325 (65 FE) MG tablet Take 325 mg by mouth daily with breakfast.   finasteride 5 MG tablet Commonly known as: PROSCAR Take 1 tablet by mouth daily.   Fish Oil 1000 MG Caps Take by mouth.   furosemide 20 MG tablet Commonly known as: LASIX Take 1 tablet (20 mg total) by mouth daily. Start taking on: March 08, 2021   hydrALAZINE 10 MG tablet Commonly known as: APRESOLINE Take 1 tablet (10 mg total) by mouth 2 (two) times daily.   isosorbide mononitrate 30 MG 24 hr tablet Commonly known as: IMDUR Take 0.5 tablets (15 mg total) by mouth daily. Start taking on: March 08, 2021   metoprolol succinate 50 MG 24 hr tablet Commonly known as: TOPROL-XL Take 1 tablet (50 mg total) by mouth daily. Take with or immediately following a meal. Start taking on: March 08, 2021   niacin 1000 MG CR tablet Commonly known as: NIASPAN Take 1,000 mg by mouth daily.   senna-docusate 8.6-50 MG tablet Commonly known as: Senokot-S Take 1 tablet by mouth 2 (two) times daily.   tamsulosin 0.4 MG Caps capsule Commonly known as: FLOMAX Take 1 capsule by mouth in the morning and at bedtime.   vitamin C 500 MG tablet Commonly known as: ASCORBIC ACID Take 500 mg by mouth daily.   Vitamin D3 10 MCG (400 UNIT) Caps Take 1 capsule by mouth daily.        Follow-up Information     [facility] [place] [facility].   Specialty: Home Health Services Why: Will contact  you to schedule home health visits. Contact information: [address] [place] [address] [phone]                 Discharge Exam: [name] Weights   03/06/21 0501 03/07/21 0550  Weight: 65.5 kg 62.6 kg      Physical Exam:   General:  Alert, oriented, cooperative, no distress;   HEENT:  Normocephalic, PERRL, otherwise with in Normal limits   Neuro:  CNII-XII intact. , normal motor and sensation, reflexes intact   Lungs:   Clear to auscultation BL, Respirations unlabored, no wheezes / crackles  Cardio:    S1/S2, RRR, No murmure, No Rubs or Gallops   Abdomen:   Soft, non-tender, bowel sounds active all four quadrants,  no guarding or peritoneal signs.  Muscular skeletal:  Limited exam - in bed, able to move all 4 extremities, Normal strength,  2+ pulses,  symmetric, No pitting edema  Skin:  Dry, warm to touch, negative for any Rashes,  Wounds: Please see nursing documentation  Condition at discharge: good  The results of significant diagnostics from this hospitalization (including imaging, microbiology, ancillary and laboratory) are listed below for reference.   Imaging Studies: CT Angio Chest PE W and/or Wo Contrast  Result Date: 03/05/2021 CLINICAL DATA:  Shortness of breath. EXAM: CT ANGIOGRAPHY CHEST WITH CONTRAST TECHNIQUE: Multidetector CT imaging of the chest was performed using the standard protocol during bolus administration of intravenous contrast. Multiplanar CT image reconstructions and MIPs were obtained to evaluate the vascular anatomy. RADIATION DOSE REDUCTION: This exam was performed according to the departmental dose-optimization program which includes automated exposure control, adjustment of the mA and/or kV according to patient size and/or use of iterative reconstruction technique. CONTRAST:  76mL OMNIPAQUE IOHEXOL 350 MG/ML SOLN COMPARISON:  None. FINDINGS: Cardiovascular: Satisfactory opacification of the pulmonary arteries to  the segmental level. No evidence of pulmonary embolism. Normal heart size. No pericardial effusion. Thoracic aortic atherosclerosis. Coronary artery atherosclerosis. Mediastinum/Nodes: No enlarged mediastinal, hilar, or axillary lymph nodes. Thyroid gland, trachea, and esophagus demonstrate no significant findings. Lungs/Pleura: Small bilateral pleural effusions. Bilateral mild interstitial thickening in the upper and lower lobes. Focal area of interstitial thickening and ground-glass opacities at the lung apices. No pneumothorax. Upper Abdomen: No acute abnormality. Musculoskeletal: No acute osseous abnormality. No aggressive osseous lesion. Generalized osteopenia. Review of the MIP images confirms the above findings. IMPRESSION: 1. No pulmonary embolism. 2. Small bilateral pleural effusions with mild interstitial thickening in the upper and lower lobes, suggestive of pulmonary edema. 3. Focal area of interstitial thickening and ground-glass opacities at the lung apices concerning for atypical infection versus related to pulmonary edema. 4. Aortic Atherosclerosis (ICD10-I70.0). Electronically Signed   By: Kathreen Devoid M.D.   On: 03/05/2021 15:34   DG Chest Portable 1 View  Result Date: 03/05/2021 CLINICAL DATA:  Shortness of breath EXAM: PORTABLE CHEST 1 VIEW COMPARISON:  No recent examination available for comparison FINDINGS: The heart is normal in size. Atherosclerotic calcification of the aortic arch. Bilateral lower lobe opacities, right worse than the left concerning for airspace disease. Right upper lobe opacity, likely scarring. IMPRESSION: Bilateral lower lobe reticulonodular and hazy opacities, right worse than the left, concerning for pneumonia. Follow-up examination to resolution is recommended. Electronically Signed   By: Keane Police D.O.   On: 03/05/2021 13:07   ECHOCARDIOGRAM COMPLETE  Result Date: 03/06/2021    ECHOCARDIOGRAM REPORT   Patient Name:   CACHE BILLS Date of Exam: 03/06/2021  Medical Rec #:  [id number]      Height:       67.0 in Accession #:    [id number]     Weight:       144.4 lb Date of Birth:  1924-10-15      BSA:          1.761 m Patient Age:    12 years       BP:           123/57 mmHg Patient Gender: M              HR:           75 bpm. Exam Location:  [facility] Procedure: 2D Echo, Cardiac Doppler and Color Doppler Indications:    Pulmonary edema  History:        Patient has prior history of Echocardiogram examinations, most                 recent 03/07/2017. CHF, CAD; Risk Factors:Hypertension,  Dyslipidemia and Former Smoker.  Sonographer:    Wenda Low Referring Phys: [place]  1. Left ventricular ejection fraction, by estimation, is 30 to 35%. The left ventricle has moderately decreased function. The left ventricle demonstrates regional wall motion abnormalities (see scoring diagram/findings for description). There is mild left ventricular hypertrophy. Left ventricular diastolic parameters are consistent with Grade I diastolic dysfunction (impaired relaxation). Elevated left ventricular end-diastolic pressure.  2. Right ventricular systolic function is normal. The right ventricular size is normal. There is normal pulmonary artery systolic pressure. The estimated right ventricular systolic pressure is 16.1 mmHg.  3. Left atrial size was moderately dilated.  4. The mitral valve is degenerative. Mild mitral valve regurgitation.  5. The aortic valve has an indeterminant number of cusps. There is moderate calcification of the aortic valve. Aortic valve regurgitation is moderate to severe. Moderate aortic valve stenosis with low gradient. Aortic regurgitation PHT measures 210 msec. Aortic valve area, by VTI measures 1.37 cm. Aortic valve mean gradient measures 13.0 mmHg. Dimentionless index 0.44.  6. The inferior vena cava is normal in size with greater than 50% respiratory variability, suggesting right atrial pressure of 3 mmHg.  Comparison(s): Prior images reviewed side by side. LVEF has decreased, now 30-35% range. Aortic stenosis is moderate range and aortic regurgitation is moderate to severe. FINDINGS  Left Ventricle: Left ventricular ejection fraction, by estimation, is 30 to 35%. The left ventricle has moderately decreased function. The left ventricle demonstrates regional wall motion abnormalities. The left ventricular internal cavity size was normal in size. There is mild left ventricular hypertrophy. Left ventricular diastolic parameters are consistent with Grade I diastolic dysfunction (impaired relaxation). Elevated left ventricular end-diastolic pressure.  LV Wall Scoring: The antero-lateral wall and posterior wall are akinetic. The anterior septum, inferior wall, apical lateral segment, and apex are hypokinetic. The entire anterior wall, inferior septum, and apical inferior segment are normal. Right Ventricle: The right ventricular size is normal. No increase in right ventricular wall thickness. Right ventricular systolic function is normal. There is normal pulmonary artery systolic pressure. The tricuspid regurgitant velocity is 1.87 m/s, and  with an assumed right atrial pressure of 3 mmHg, the estimated right ventricular systolic pressure is 09.6 mmHg. Left Atrium: Left atrial size was moderately dilated. Right Atrium: Right atrial size was normal in size. Pericardium: There is no evidence of pericardial effusion. Mitral Valve: The mitral valve is degenerative in appearance. There is mild thickening of the mitral valve leaflet(s). There is mild calcification of the mitral valve leaflet(s). Mild to moderate mitral annular calcification. Mild mitral valve regurgitation. MV peak gradient, 6.2 mmHg. The mean mitral valve gradient is 2.0 mmHg. Tricuspid Valve: The tricuspid valve is grossly normal. Tricuspid valve regurgitation is mild. Aortic Valve: The aortic valve has an indeterminant number of cusps. There is moderate  calcification of the aortic valve. Aortic valve regurgitation is moderate to severe. Aortic regurgitation PHT measures 210 msec. Moderate aortic stenosis is present. Aortic valve mean gradient measures 13.0 mmHg. Aortic valve peak gradient measures 25.2 mmHg. Aortic valve area, by VTI measures 1.37 cm. Pulmonic Valve: The pulmonic valve was not well visualized. Pulmonic valve regurgitation is trivial. Aorta: The aortic root is normal in size and structure. Venous: The inferior vena cava is normal in size with greater than 50% respiratory variability, suggesting right atrial pressure of 3 mmHg. IAS/Shunts: No atrial level shunt detected by color flow Doppler.  LEFT VENTRICLE PLAX 2D LVIDd:  4.90 cm     Diastology LVIDs:         4.40 cm     LV e' medial:    4.20 cm/s LV PW:         1.20 cm     LV E/e' medial:  23.6 LV IVS:        1.30 cm     LV e' lateral:   6.38 cm/s LVOT diam:     2.00 cm     LV E/e' lateral: 15.5 LV SV:         79 LV SV Index:   45 LVOT Area:     3.14 cm  LV Volumes (MOD) LV vol d, MOD A2C: 68.4 ml LV vol d, MOD A4C: 94.3 ml LV vol s, MOD A2C: 51.5 ml LV vol s, MOD A4C: 56.9 ml LV SV MOD A2C:     16.9 ml LV SV MOD A4C:     94.3 ml LV SV MOD BP:      24.9 ml RIGHT VENTRICLE RV Basal diam:  2.95 cm RV Mid diam:    2.10 cm RV S prime:     13.20 cm/s TAPSE (M-mode): 2.8 cm LEFT ATRIUM             Index        RIGHT ATRIUM           Index LA diam:        3.50 cm 1.99 cm/m   RA Area:     16.50 cm LA Vol (A2C):   94.2 ml 53.50 ml/m  RA Volume:   46.60 ml  26.46 ml/m LA Vol (A4C):   71.0 ml 40.32 ml/m LA Biplane Vol: 83.7 ml 47.53 ml/m  AORTIC VALVE                     PULMONIC VALVE AV Area (Vmax):    1.39 cm      PV Vmax:       0.65 m/s AV Area (Vmean):   1.16 cm      PV Peak grad:  1.7 mmHg AV Area (VTI):     1.37 cm AV Vmax:           251.00 cm/s AV Vmean:          166.500 cm/s AV VTI:            0.578 m AV Peak Grad:      25.2 mmHg AV Mean Grad:      13.0 mmHg LVOT Vmax:          111.00 cm/s LVOT Vmean:        61.500 cm/s LVOT VTI:          0.252 m LVOT/AV VTI ratio: 0.44 AI PHT:            210 msec  AORTA Ao Root diam: 2.80 cm Ao Asc diam:  2.80 cm MITRAL VALVE                TRICUSPID VALVE MV Area (PHT): 3.60 cm     TR Peak grad:   14.0 mmHg MV Area VTI:   3.07 cm     TR Vmax:        187.00 cm/s MV Peak grad:  6.2 mmHg MV Mean grad:  2.0 mmHg     SHUNTS MV Vmax:       1.25 m/s     Systemic VTI:  0.25 m MV Vmean:  69.5 cm/s    Systemic Diam: 2.00 cm MV Decel Time: 211 msec MV E velocity: 99.05 cm/s MV A velocity: 102.20 cm/s MV E/A ratio:  0.97 Rozann Lesches MD Electronically signed by Rozann Lesches MD Signature Date/Time: 03/06/2021/11:17:39 AM    Final     Microbiology: Results for orders placed or performed during the hospital encounter of 03/05/21  Resp Panel by RT-PCR (Flu A&B, Covid) Nasopharyngeal Swab     Status: None   Collection Time: 03/05/21 12:14 PM   Specimen: Nasopharyngeal Swab; Nasopharyngeal(NP) swabs in vial transport medium  Result Value Ref Range Status   SARS Coronavirus 2 by RT PCR NEGATIVE NEGATIVE Final    Comment: (NOTE) SARS-CoV-2 target nucleic acids are NOT DETECTED.  The SARS-CoV-2 RNA is generally detectable in upper respiratory specimens during the acute phase of infection. The lowest concentration of SARS-CoV-2 viral copies this assay can detect is 138 copies/mL. A negative result does not preclude SARS-Cov-2 infection and should not be used as the sole basis for treatment or other patient management decisions. A negative result may occur with  improper specimen collection/handling, submission of specimen other than nasopharyngeal swab, presence of viral mutation(s) within the areas targeted by this assay, and inadequate number of viral copies(<138 copies/mL). A negative result must be combined with clinical observations, patient history, and epidemiological information. The expected result is Negative.  Fact Sheet for  Patients:  EntrepreneurPulse.com.au  Fact Sheet for Healthcare Providers:  IncredibleEmployment.be  This test is no t yet approved or cleared by the [place] FDA and  has been authorized for detection and/or diagnosis of SARS-CoV-2 by FDA under an Emergency Use Authorization (EUA). This EUA will remain  in effect (meaning this test can be used) for the duration of the COVID-19 declaration under Section 564(b)(1) of the Act, 21 U.S.C.section 360bbb-3(b)(1), unless the authorization is terminated  or revoked sooner.       Influenza A by PCR NEGATIVE NEGATIVE Final   Influenza B by PCR NEGATIVE NEGATIVE Final    Comment: (NOTE) The Xpert Xpress SARS-CoV-2/FLU/RSV plus assay is intended as an aid in the diagnosis of influenza from Nasopharyngeal swab specimens and should not be used as a sole basis for treatment. Nasal washings and aspirates are unacceptable for Xpert Xpress SARS-CoV-2/FLU/RSV testing.  Fact Sheet for Patients: EntrepreneurPulse.com.au  Fact Sheet for Healthcare Providers: IncredibleEmployment.be  This test is not yet approved or cleared by the [place] FDA and has been authorized for detection and/or diagnosis of SARS-CoV-2 by FDA under an Emergency Use Authorization (EUA). This EUA will remain in effect (meaning this test can be used) for the duration of the COVID-19 declaration under Section 564(b)(1) of the Act, 21 U.S.C. section 360bbb-3(b)(1), unless the authorization is terminated or revoked.  Performed at [facility], [address] [place], [place] [address]     Labs: CBC: Recent Labs  Lab 03/05/21 1303  WBC 4.2  NEUTROABS 2.4  HGB 10.0*  HCT 29.3*  MCV 93.3  PLT 295*   Basic Metabolic Panel: Recent Labs  Lab 03/05/21 1303 03/06/21 0452 03/07/21 0433  NA 138 141 140  K 4.2 3.7 3.8  CL 107 107 105  CO2 26 28 29   GLUCOSE 150* 124* 90  BUN 17 17 18    CREATININE 1.14 1.09 1.26*  CALCIUM 9.0 8.9 8.9  MG 1.8  --   --    Liver Function Tests: Recent Labs  Lab 03/05/21 1303  AST 27  ALT 24  ALKPHOS 65  BILITOT 0.9  PROT 6.3*  ALBUMIN 3.2*   CBG: No results for input(s): GLUCAP in the last 168 hours.  Discharge time spent: greater than 30 minutes.  Signed: Deatra James, MD [facility] 03/07/2021

## 2021-03-11 ENCOUNTER — Other Ambulatory Visit: Payer: Self-pay | Admitting: Ophthalmology

## 2021-03-11 DIAGNOSIS — H3562 Retinal hemorrhage, left eye: Secondary | ICD-10-CM

## 2021-03-21 ENCOUNTER — Other Ambulatory Visit: Payer: Self-pay

## 2021-03-21 ENCOUNTER — Ambulatory Visit (HOSPITAL_COMMUNITY)
Admission: RE | Admit: 2021-03-21 | Discharge: 2021-03-21 | Disposition: A | Discharge status: Home / Self Care | Payer: Medicare Other | Source: Ambulatory Visit | Attending: Ophthalmology | Admitting: Ophthalmology

## 2021-03-21 DIAGNOSIS — H3562 Retinal hemorrhage, left eye: Secondary | ICD-10-CM | POA: Insufficient documentation

## 2021-04-06 ENCOUNTER — Other Ambulatory Visit: Payer: Self-pay

## 2021-04-06 ENCOUNTER — Encounter: Payer: Self-pay | Admitting: Student

## 2021-04-06 ENCOUNTER — Ambulatory Visit (INDEPENDENT_AMBULATORY_CARE_PROVIDER_SITE_OTHER): Payer: Medicare Other | Admitting: Student

## 2021-04-06 VITALS — BP 102/54 | HR 82 | Ht 67.0 in | Wt 142.0 lb

## 2021-04-06 DIAGNOSIS — E782 Mixed hyperlipidemia: Secondary | ICD-10-CM

## 2021-04-06 DIAGNOSIS — I5020 Unspecified systolic (congestive) heart failure: Secondary | ICD-10-CM | POA: Diagnosis not present

## 2021-04-06 DIAGNOSIS — I350 Nonrheumatic aortic (valve) stenosis: Secondary | ICD-10-CM | POA: Diagnosis not present

## 2021-04-06 DIAGNOSIS — I6523 Occlusion and stenosis of bilateral carotid arteries: Secondary | ICD-10-CM

## 2021-04-06 DIAGNOSIS — I10 Essential (primary) hypertension: Secondary | ICD-10-CM | POA: Diagnosis not present

## 2021-04-06 NOTE — Progress Notes (Signed)
Cardiology Office Note    Date:  04/06/2021   ID:  Landin, Tallon [birth year], MRN [id number]  PCP:  [name], MD  Cardiologist: Rozann Lesches, MD    Chief Complaint  Patient presents with   Hospitalization Follow-up    History of Present Illness:    Charles Lester is a 88 y.o. male with past medical history of aortic stenosis, CAD (based off prior noninvasive testing and medical management pursued in the setting of his advanced age), carotid artery stenosis, HTN and HLD who presents to the office today for hospital follow-up.  He was recently admitted to [facility] in 03/2021 for an acute CHF exacerbation and repeat echocardiogram showed his EF was reduced at 30 to 35% which was new when compared to prior imaging. His aortic stenosis was in a moderate range with low gradient and was noted to have moderate to severe AI. Options were reviewed with the patient and he wished to pursue medical therapy only without invasive testing and this was felt to be reasonable given his age.  Lopressor was transitioned to Toprol-XL 50 mg daily and he was started on Imdur 15 mg daily and Hydralazine 10 mg 3 times daily given his AKI during admission. He was continued on Lasix 20 mg daily.  In talking with the patient today, he reports overall feeling well since his recent admission. Says his breathing has been back to baseline and he denies any recurrent orthopnea or PND. Says that he is not overly active at baseline but denies any dyspnea when walking around the grocery store.  e does report feeling "stressed" in his chest at times if he overdoes it but denies specific pain. No recent palpitations, dizziness or presyncope.  Past Medical History:  Diagnosis Date   Aortic stenosis    a. Mild to moderate 2017 b. echo in 03/2021 showing moderate AS and moderate to severe AI   Arthritis    CAD (coronary artery disease)    Cardiomyopathy (HCC)    a. EF 30-35% by echo in 03/2020. Patient declined  further work-up in the setting of his advanced age.   Essential hypertension    Hyperlipidemia    Ischemic heart disease    Based on Myoview 2010 revealing inferolateral scar but no active ischemia   PAD (peripheral artery disease) (HCC)    Type 2 diabetes mellitus (HCC)     Past Surgical History:  Procedure Laterality Date   NO PAST SURGERIES      Current Medications: Outpatient Medications Prior to Visit  Medication Sig Dispense Refill   aspirin EC 81 MG tablet Take 1 tablet (81 mg total) by mouth daily. 30 tablet 11   atorvastatin (LIPITOR) 20 MG tablet Take 1 tablet (20 mg total) by mouth daily. 90 tablet 3   Cholecalciferol (VITAMIN D3) 10 MCG (400 UNIT) CAPS Take 1 capsule by mouth daily.     clopidogrel (PLAVIX) 75 MG tablet Take 1 tablet (75 mg total) by mouth daily. 90 tablet 3   ferrous sulfate 325 (65 FE) MG tablet Take 325 mg by mouth daily with breakfast.     finasteride (PROSCAR) 5 MG tablet Take 1 tablet by mouth daily.     furosemide (LASIX) 20 MG tablet Take 1 tablet (20 mg total) by mouth daily. 30 tablet 0   hydrALAZINE (APRESOLINE) 10 MG tablet Take 1 tablet (10 mg total) by mouth 2 (two) times daily. 60 tablet 0   isosorbide mononitrate (IMDUR) 30 MG 24  hr tablet Take 0.5 tablets (15 mg total) by mouth daily. 15 tablet 1   metoprolol succinate (TOPROL-XL) 50 MG 24 hr tablet Take 1 tablet (50 mg total) by mouth daily. Take with or immediately following a meal. 30 tablet 1   niacin (NIASPAN) 1000 MG CR tablet Take 1,000 mg by mouth daily.     Omega-3 Fatty Acids (FISH OIL) 1000 MG CAPS Take by mouth.     senna-docusate (SENOKOT-S) 8.6-50 MG tablet Take 1 tablet by mouth 2 (two) times daily. 60 tablet 0   tamsulosin (FLOMAX) 0.4 MG CAPS Take 1 capsule by mouth in the morning and at bedtime.     vitamin C (ASCORBIC ACID) 500 MG tablet Take 500 mg by mouth daily.     amLODipine (NORVASC) 2.5 MG tablet Take 1 tablet (2.5 mg total) by mouth daily. 90 tablet 3   No  facility-administered medications prior to visit.     Allergies:   Patient has no known allergies.   Social History   Socioeconomic History   Marital status: Married    Spouse name: Not on file   Number of children: Not on file   Years of education: Not on file   Highest education level: Not on file  Occupational History   Occupation: Retired  Tobacco Use   Smoking status: Former    Types: Cigarettes    Quit date: 02/10/2005    Years since quitting: 16.1   Smokeless tobacco: Never  Vaping Use   Vaping Use: Never used  Substance and Sexual Activity   Alcohol use: No   Drug use: No   Sexual activity: Not Currently    Partners: Female  Other Topics Concern   Not on file  Social History Narrative   No regular exercise   Social Determinants of Health   Financial Resource Strain: Not on file  Food Insecurity: Not on file  Transportation Needs: Not on file  Physical Activity: Not on file  Stress: Not on file  Social Connections: Not on file     Family History:  The patient's family history includes Hypertension in his brother, father, and sister.   Review of Systems:    Please see the history of present illness.     All other systems reviewed and are otherwise negative except as noted above.   Physical Exam:    VS:  BP (!) 102/54    Pulse 82    Ht '5\' 7"'$  (1.702 m)    Wt 142 lb (64.4 kg)    SpO2 99%    BMI 22.24 kg/m    General: Pleasant elderly male appearing in no acute distress. Head: Normocephalic, atraumatic. Neck: No carotid bruits. JVD not elevated.  Lungs: Respirations regular and unlabored, without wheezes or rales.  Heart: Regular rate and rhythm. No S3 or S4. 2/6 SEM along RUSB.  Abdomen: Appears non-distended. No obvious abdominal masses. Msk:  Strength and tone appear normal for age. No obvious joint deformities or effusions. Extremities: No clubbing or cyanosis. No pitting edema.  Distal pedal pulses are 2+ bilaterally. Neuro: Alert and oriented X  3. Moves all extremities spontaneously. No focal deficits noted. Psych:  Responds to questions appropriately with a normal affect. Skin: No rashes or lesions noted  Wt Readings from Last 3 Encounters:  04/06/21 142 lb (64.4 kg)  03/07/21 138 lb 1.6 oz (62.6 kg)  10/22/20 144 lb (65.3 kg)     Studies/Labs Reviewed:   EKG:  EKG is not ordered today.  Recent Labs: 03/05/2021: ALT 24; Hemoglobin 10.0; Magnesium 1.8; Platelets 117 03/07/2021: B Natriuretic Peptide 314.0; BUN 18; Creatinine, Ser 1.26; Potassium 3.8; Sodium 140   Lipid Panel    Component Value Date/Time   CHOL 113 10/27/2008 0000   TRIG 87 10/27/2008 0000   HDL 40 10/27/2008 0000   LDLCALC 56 10/27/2008 0000    Additional studies/ records that were reviewed today include:   Echocardiogram: 03/2021 IMPRESSIONS     1. Left ventricular ejection fraction, by estimation, is 30 to 35%. The  left ventricle has moderately decreased function. The left ventricle  demonstrates regional wall motion abnormalities (see scoring  diagram/findings for description). There is mild  left ventricular hypertrophy. Left ventricular diastolic parameters are  consistent with Grade I diastolic dysfunction (impaired relaxation).  Elevated left ventricular end-diastolic pressure.   2. Right ventricular systolic function is normal. The right ventricular  size is normal. There is normal pulmonary artery systolic pressure. The  estimated right ventricular systolic pressure is 08.6 mmHg.   3. Left atrial size was moderately dilated.   4. The mitral valve is degenerative. Mild mitral valve regurgitation.   5. The aortic valve has an indeterminant number of cusps. There is  moderate calcification of the aortic valve. Aortic valve regurgitation is  moderate to severe. Moderate aortic valve stenosis with low gradient.  Aortic regurgitation PHT measures 210  msec. Aortic valve area, by VTI measures 1.37 cm. Aortic valve mean  gradient measures  13.0 mmHg. Dimentionless index 0.44.   6. The inferior vena cava is normal in size with greater than 50%  respiratory variability, suggesting right atrial pressure of 3 mmHg.   Comparison(s): Prior images reviewed side by side. LVEF has decreased, now  30-35% range. Aortic stenosis is moderate range and aortic regurgitation  is moderate to severe.   Assessment:    1. HFrEF (heart failure with reduced ejection fraction) ([place])   2. Nonrheumatic aortic valve stenosis   3. Bilateral carotid artery stenosis   4. Essential hypertension   5. Mixed hyperlipidemia      Plan:   In order of problems listed above:  1.HFrEF - Recent echocardiogram showed his EF was reduced at 30 to 35% as outlined above and he did not wish to pursue further testing in the setting of his advanced age which is certainly reasonable. He has overall been doing well since returning home and appears euvolemic on examination today. - He is scheduled for follow-up labs with his PCP later this week and wishes to hold off on blood work until then. Will request a copy of labs once available. Will continue medical therapy with Hydralazine 10 mg twice daily, Imdur 15 mg daily, Toprol-XL 50 mg daily and Lasix 20 mg daily. He is not on an ACE-I/ARB/ARNI/Sprio/SGLT2 inhibitor given his variable renal function and BP does not allow for titration today. Will stop Amlodipine given his soft BP.   2. Aortic Stenosis - Recent echocardiogram last month showed moderate to severe AI and moderate AS. He has been very clear in his decision that he would not wish to pursue further testing or evaluation including cardiac catheterization or TAVR.  3. Carotid Artery Stenosis - Dopplers in 2022 showed 1 to 39% stenosis bilaterally. Would not anticipate further imaging at this time in the setting of his advanced age. He remains on ASA, Plavix and Atorvastatin.  4. HTN - His BP was initially recorded at 102/54, rechecked and at 112/58. Given the  addition of Hydralazine and Imdur  during his recent admission, I did recommend stopping Amlodipine 2.5 mg daily given his soft BP. Will continue Hydralazine, Imdur and Toprol-XL at current dosing.  5. HLD - Followed by his PCP.  Will request a copy of most recent records as outlined above. At this time he is on Atorvastatin 20 mg daily and Niacin 1000 mg daily. Anticipate Niacin could be discontinued pending reassessment of follow-up labs and due to his age to avoid polypharmacy. By review of records, he has been on this for many years and tolerated well without side effects.   Medication Adjustments/Labs and Tests Ordered: Current medicines are reviewed at length with the patient today.  Concerns regarding medicines are outlined above.  Medication changes, Labs and Tests ordered today are listed in the Patient Instructions below. Patient Instructions  Medication Instructions:   STOP AMLODIPINE.  *If you need a refill on your cardiac medications before your next appointment, please call your pharmacy*   Lab Work:  Keep plans for labs with your PCP within the next 1-2 weeks.     Follow-Up: At [facility], you and your health needs are our priority.  As part of our continuing mission to provide you with exceptional heart care, we have created designated Provider Care Teams.  These Care Teams include your primary Cardiologist (physician) and Advanced Practice Providers (APPs -  Physician Assistants and Nurse Practitioners) who all work together to provide you with the care you need, when you need it.  We recommend signing up for the patient portal called "MyChart".  Sign up information is provided on this After Visit Summary.  MyChart is used to connect with patients for Virtual Visits (Telemedicine).  Patients are able to view lab/test results, encounter notes, upcoming appointments, etc.  Non-urgent messages can be sent to your provider as well.   To learn more about what you can do with  MyChart, go to [email or web address].    Your next appointment:   3 month(s)  The format for your next appointment:   In Person  Provider:   You may see Rozann Lesches, MD or one of the following Advanced Practice Providers on your designated Care Team:   Bernerd Pho, PA-C  Ermalinda Barrios, PA-C {     Signed, Waynetta Pean  04/06/2021 4:37 PM    [place]. [address] [place], [place] [address] Phone: [phone] Fax: [phone]

## 2021-04-06 NOTE — Patient Instructions (Signed)
Medication Instructions:  ? ?STOP AMLODIPINE. ? ?*If you need a refill on your cardiac medications before your next appointment, please call your pharmacy* ? ? ?Lab Work: ? ?Keep plans for labs with your PCP within the next 1-2 weeks.  ? ? ? ?Follow-Up: ?At [facility], you and your health needs are our priority.  As part of our continuing mission to provide you with exceptional heart care, we have created designated Provider Care Teams.  These Care Teams include your primary Cardiologist (physician) and Advanced Practice Providers (APPs -  Physician Assistants and Nurse Practitioners) who all work together to provide you with the care you need, when you need it. ? ?We recommend signing up for the patient portal called "MyChart".  Sign up information is provided on this After Visit Summary.  MyChart is used to connect with patients for Virtual Visits (Telemedicine).  Patients are able to view lab/test results, encounter notes, upcoming appointments, etc.  Non-urgent messages can be sent to your provider as well.   ?To learn more about what you can do with MyChart, go to [email or web address].   ? ?Your next appointment:   ?3 month(s) ? ?The format for your next appointment:   ?In Person ? ?Provider:   ?You may see Rozann Lesches, MD or one of the following Advanced Practice Providers on your designated Care Team:   ?Bernerd Pho, PA-C  ?Ermalinda Barrios, PA-C { ? ? ?

## 2021-04-28 ENCOUNTER — Other Ambulatory Visit (HOSPITAL_COMMUNITY): Payer: Self-pay | Admitting: Adult Health

## 2021-04-28 DIAGNOSIS — R609 Edema, unspecified: Secondary | ICD-10-CM

## 2021-05-02 ENCOUNTER — Other Ambulatory Visit: Payer: Self-pay

## 2021-05-02 MED ORDER — ISOSORBIDE MONONITRATE ER 30 MG PO TB24: 15.0000 mg | ORAL_TABLET | Freq: Every day | ORAL | 1 refills | Status: DC

## 2021-05-02 MED ORDER — METOPROLOL SUCCINATE ER 50 MG PO TB24: 50.0000 mg | ORAL_TABLET | Freq: Every day | ORAL | 1 refills | Status: DC

## 2021-05-03 ENCOUNTER — Ambulatory Visit (HOSPITAL_COMMUNITY)
Admission: RE | Admit: 2021-05-03 | Discharge: 2021-05-03 | Disposition: A | Discharge status: Home / Self Care | Payer: Medicare Other | Source: Ambulatory Visit | Attending: Adult Health | Admitting: Adult Health

## 2021-05-03 DIAGNOSIS — R609 Edema, unspecified: Secondary | ICD-10-CM | POA: Diagnosis present

## 2021-05-20 ENCOUNTER — Inpatient Hospital Stay (HOSPITAL_COMMUNITY)
Admission: EM | Admit: 2021-05-20 | Discharge: 2021-05-31 | DRG: 291 | Disposition: A | Discharge status: Hospice | Payer: Medicare Other | Attending: Internal Medicine | Admitting: Internal Medicine

## 2021-05-20 ENCOUNTER — Other Ambulatory Visit: Payer: Self-pay

## 2021-05-20 ENCOUNTER — Encounter (HOSPITAL_COMMUNITY): Payer: Self-pay

## 2021-05-20 ENCOUNTER — Emergency Department (HOSPITAL_COMMUNITY): Payer: Medicare Other

## 2021-05-20 DIAGNOSIS — I130 Hypertensive heart and chronic kidney disease with heart failure and stage 1 through stage 4 chronic kidney disease, or unspecified chronic kidney disease: Secondary | ICD-10-CM | POA: Diagnosis not present

## 2021-05-20 DIAGNOSIS — I5021 Acute systolic (congestive) heart failure: Secondary | ICD-10-CM | POA: Diagnosis present

## 2021-05-20 DIAGNOSIS — I5030 Unspecified diastolic (congestive) heart failure: Secondary | ICD-10-CM | POA: Diagnosis present

## 2021-05-20 DIAGNOSIS — I429 Cardiomyopathy, unspecified: Secondary | ICD-10-CM | POA: Diagnosis present

## 2021-05-20 DIAGNOSIS — Z66 Do not resuscitate: Secondary | ICD-10-CM | POA: Diagnosis not present

## 2021-05-20 DIAGNOSIS — L89153 Pressure ulcer of sacral region, stage 3: Secondary | ICD-10-CM | POA: Diagnosis present

## 2021-05-20 DIAGNOSIS — N1832 Chronic kidney disease, stage 3b: Secondary | ICD-10-CM | POA: Diagnosis present

## 2021-05-20 DIAGNOSIS — I350 Nonrheumatic aortic (valve) stenosis: Secondary | ICD-10-CM | POA: Diagnosis not present

## 2021-05-20 DIAGNOSIS — E119 Type 2 diabetes mellitus without complications: Secondary | ICD-10-CM

## 2021-05-20 DIAGNOSIS — R570 Cardiogenic shock: Secondary | ICD-10-CM | POA: Diagnosis not present

## 2021-05-20 DIAGNOSIS — E1122 Type 2 diabetes mellitus with diabetic chronic kidney disease: Secondary | ICD-10-CM | POA: Diagnosis present

## 2021-05-20 DIAGNOSIS — I2510 Atherosclerotic heart disease of native coronary artery without angina pectoris: Secondary | ICD-10-CM | POA: Diagnosis present

## 2021-05-20 DIAGNOSIS — N179 Acute kidney failure, unspecified: Secondary | ICD-10-CM | POA: Diagnosis not present

## 2021-05-20 DIAGNOSIS — Z7189 Other specified counseling: Secondary | ICD-10-CM | POA: Diagnosis not present

## 2021-05-20 DIAGNOSIS — Z515 Encounter for palliative care: Secondary | ICD-10-CM

## 2021-05-20 DIAGNOSIS — L89152 Pressure ulcer of sacral region, stage 2: Secondary | ICD-10-CM | POA: Diagnosis present

## 2021-05-20 DIAGNOSIS — Z7902 Long term (current) use of antithrombotics/antiplatelets: Secondary | ICD-10-CM

## 2021-05-20 DIAGNOSIS — E785 Hyperlipidemia, unspecified: Secondary | ICD-10-CM | POA: Diagnosis present

## 2021-05-20 DIAGNOSIS — I083 Combined rheumatic disorders of mitral, aortic and tricuspid valves: Secondary | ICD-10-CM | POA: Diagnosis present

## 2021-05-20 DIAGNOSIS — E1151 Type 2 diabetes mellitus with diabetic peripheral angiopathy without gangrene: Secondary | ICD-10-CM | POA: Diagnosis present

## 2021-05-20 DIAGNOSIS — Z20822 Contact with and (suspected) exposure to covid-19: Secondary | ICD-10-CM | POA: Diagnosis present

## 2021-05-20 DIAGNOSIS — I3139 Other pericardial effusion (noninflammatory): Secondary | ICD-10-CM | POA: Diagnosis present

## 2021-05-20 DIAGNOSIS — I5023 Acute on chronic systolic (congestive) heart failure: Secondary | ICD-10-CM | POA: Diagnosis not present

## 2021-05-20 DIAGNOSIS — M1990 Unspecified osteoarthritis, unspecified site: Secondary | ICD-10-CM | POA: Diagnosis present

## 2021-05-20 DIAGNOSIS — J9601 Acute respiratory failure with hypoxia: Secondary | ICD-10-CM

## 2021-05-20 DIAGNOSIS — R0609 Other forms of dyspnea: Secondary | ICD-10-CM | POA: Diagnosis not present

## 2021-05-20 DIAGNOSIS — I5043 Acute on chronic combined systolic (congestive) and diastolic (congestive) heart failure: Secondary | ICD-10-CM

## 2021-05-20 DIAGNOSIS — R64 Cachexia: Secondary | ICD-10-CM | POA: Diagnosis present

## 2021-05-20 DIAGNOSIS — I471 Supraventricular tachycardia: Secondary | ICD-10-CM | POA: Diagnosis not present

## 2021-05-20 DIAGNOSIS — R54 Age-related physical debility: Secondary | ICD-10-CM | POA: Diagnosis present

## 2021-05-20 DIAGNOSIS — Z7982 Long term (current) use of aspirin: Secondary | ICD-10-CM | POA: Diagnosis not present

## 2021-05-20 DIAGNOSIS — Z6821 Body mass index (BMI) 21.0-21.9, adult: Secondary | ICD-10-CM

## 2021-05-20 DIAGNOSIS — Z79899 Other long term (current) drug therapy: Secondary | ICD-10-CM

## 2021-05-20 DIAGNOSIS — Z87891 Personal history of nicotine dependence: Secondary | ICD-10-CM

## 2021-05-20 DIAGNOSIS — R0602 Shortness of breath: Secondary | ICD-10-CM | POA: Diagnosis not present

## 2021-05-20 DIAGNOSIS — Z8249 Family history of ischemic heart disease and other diseases of the circulatory system: Secondary | ICD-10-CM

## 2021-05-20 DIAGNOSIS — I10 Essential (primary) hypertension: Secondary | ICD-10-CM | POA: Diagnosis present

## 2021-05-20 DIAGNOSIS — N400 Enlarged prostate without lower urinary tract symptoms: Secondary | ICD-10-CM | POA: Diagnosis present

## 2021-05-20 DIAGNOSIS — I5020 Unspecified systolic (congestive) heart failure: Secondary | ICD-10-CM | POA: Diagnosis not present

## 2021-05-20 LAB — PROTIME-INR
INR: 1.2 (ref 0.8–1.2)
Prothrombin Time: 14.8 seconds (ref 11.4–15.2)

## 2021-05-20 LAB — RESP PANEL BY RT-PCR (FLU A&B, COVID) ARPGX2
Influenza A by PCR: NEGATIVE
Influenza B by PCR: NEGATIVE
SARS Coronavirus 2 by RT PCR: NEGATIVE

## 2021-05-20 LAB — BASIC METABOLIC PANEL
Anion gap: 9 (ref 5–15)
BUN: 47 mg/dL — ABNORMAL HIGH (ref 8–23)
CO2: 21 mmol/L — ABNORMAL LOW (ref 22–32)
Calcium: 9.2 mg/dL (ref 8.9–10.3)
Chloride: 102 mmol/L (ref 98–111)
Creatinine, Ser: 1.95 mg/dL — ABNORMAL HIGH (ref 0.61–1.24)
GFR, Estimated: 31 mL/min — ABNORMAL LOW (ref >60)
Glucose, Bld: 246 mg/dL — ABNORMAL HIGH (ref 70–99)
Potassium: 4.3 mmol/L (ref 3.5–5.1)
Sodium: 132 mmol/L — ABNORMAL LOW (ref 135–145)

## 2021-05-20 LAB — CBC
HCT: 32.7 % — ABNORMAL LOW (ref 39.0–52.0)
Hemoglobin: 11.4 g/dL — ABNORMAL LOW (ref 13.0–17.0)
MCH: 32.3 pg (ref 26.0–34.0)
MCHC: 34.9 g/dL (ref 30.0–36.0)
MCV: 92.6 fL (ref 80.0–100.0)
Platelets: 130 10*3/uL — ABNORMAL LOW (ref 150–400)
RBC: 3.53 MIL/uL — ABNORMAL LOW (ref 4.22–5.81)
RDW: 14.4 % (ref 11.5–15.5)
WBC: 5.3 10*3/uL (ref 4.0–10.5)
nRBC: 0 % (ref 0.0–0.2)

## 2021-05-20 LAB — BRAIN NATRIURETIC PEPTIDE: B Natriuretic Peptide: 1127 pg/mL — ABNORMAL HIGH (ref 0.0–100.0)

## 2021-05-20 LAB — MAGNESIUM: Magnesium: 2 mg/dL (ref 1.7–2.4)

## 2021-05-20 MED ORDER — CHLORHEXIDINE GLUCONATE CLOTH 2 % EX PADS
6.0000 | MEDICATED_PAD | Freq: Every day | CUTANEOUS | Status: DC
  Administered 2021-05-20 – 2021-05-30 (×11): 6 via TOPICAL

## 2021-05-20 MED ORDER — ASPIRIN EC 81 MG PO TBEC
81.0000 mg | DELAYED_RELEASE_TABLET | Freq: Every day | ORAL | Status: DC
  Administered 2021-05-21 – 2021-05-31 (×11): 81 mg via ORAL
  Filled 2021-05-20 (×12): qty 1

## 2021-05-20 MED ORDER — AMLODIPINE BESYLATE 5 MG PO TABS
2.5000 mg | ORAL_TABLET | Freq: Every day | ORAL | Status: DC
  Administered 2021-05-21: 2.5 mg via ORAL
  Filled 2021-05-20: qty 1

## 2021-05-20 MED ORDER — MAGNESIUM SULFATE 2 GM/50ML IV SOLN
2.0000 g | Freq: Once | INTRAVENOUS | Status: AC
  Administered 2021-05-20: 2 g via INTRAVENOUS
  Filled 2021-05-20: qty 50

## 2021-05-20 MED ORDER — ISOSORBIDE MONONITRATE ER 30 MG PO TB24
15.0000 mg | ORAL_TABLET | Freq: Every day | ORAL | Status: DC
  Administered 2021-05-21: 15 mg via ORAL
  Filled 2021-05-20: qty 1

## 2021-05-20 MED ORDER — POTASSIUM CHLORIDE CRYS ER 20 MEQ PO TBCR
40.0000 meq | EXTENDED_RELEASE_TABLET | Freq: Two times a day (BID) | ORAL | Status: DC
  Administered 2021-05-20: 40 meq via ORAL
  Filled 2021-05-20: qty 2

## 2021-05-20 MED ORDER — ATORVASTATIN CALCIUM 20 MG PO TABS
20.0000 mg | ORAL_TABLET | Freq: Every day | ORAL | Status: DC
  Administered 2021-05-21 – 2021-05-31 (×11): 20 mg via ORAL
  Filled 2021-05-20 (×8): qty 1
  Filled 2021-05-20: qty 2
  Filled 2021-05-20 (×3): qty 1

## 2021-05-20 MED ORDER — FUROSEMIDE 10 MG/ML IJ SOLN
40.0000 mg | Freq: Once | INTRAMUSCULAR | Status: AC
  Administered 2021-05-20: 40 mg via INTRAVENOUS
  Filled 2021-05-20: qty 4

## 2021-05-20 MED ORDER — TAMSULOSIN HCL 0.4 MG PO CAPS
0.4000 mg | ORAL_CAPSULE | Freq: Every day | ORAL | Status: DC
  Administered 2021-05-20 – 2021-05-30 (×11): 0.4 mg via ORAL
  Filled 2021-05-20 (×11): qty 1

## 2021-05-20 MED ORDER — ENOXAPARIN SODIUM 30 MG/0.3ML IJ SOSY
30.0000 mg | PREFILLED_SYRINGE | INTRAMUSCULAR | Status: DC
  Administered 2021-05-20 – 2021-05-30 (×11): 30 mg via SUBCUTANEOUS
  Filled 2021-05-20 (×10): qty 0.3

## 2021-05-20 MED ORDER — FUROSEMIDE 10 MG/ML IJ SOLN
20.0000 mg | Freq: Once | INTRAMUSCULAR | Status: AC
  Administered 2021-05-20: 20 mg via INTRAVENOUS
  Filled 2021-05-20: qty 2

## 2021-05-20 MED ORDER — FINASTERIDE 5 MG PO TABS
5.0000 mg | ORAL_TABLET | Freq: Every day | ORAL | Status: DC
  Administered 2021-05-21 – 2021-05-31 (×11): 5 mg via ORAL
  Filled 2021-05-20 (×12): qty 1

## 2021-05-20 MED ORDER — ONDANSETRON HCL 4 MG/2ML IJ SOLN: 4.0000 mg | Freq: Four times a day (QID) | INTRAMUSCULAR | Status: DC | PRN

## 2021-05-20 MED ORDER — METOPROLOL SUCCINATE ER 50 MG PO TB24
50.0000 mg | ORAL_TABLET | Freq: Every day | ORAL | Status: DC
Start: 2021-05-21 — End: 2021-05-21
  Administered 2021-05-21: 50 mg via ORAL
  Filled 2021-05-20: qty 1

## 2021-05-20 MED ORDER — ONDANSETRON HCL 4 MG PO TABS: 4.0000 mg | ORAL_TABLET | Freq: Four times a day (QID) | ORAL | Status: DC | PRN

## 2021-05-20 MED ORDER — CLOPIDOGREL BISULFATE 75 MG PO TABS
75.0000 mg | ORAL_TABLET | Freq: Every day | ORAL | Status: DC
  Administered 2021-05-21 – 2021-05-31 (×11): 75 mg via ORAL
  Filled 2021-05-20 (×12): qty 1

## 2021-05-20 MED ORDER — POTASSIUM CHLORIDE CRYS ER 20 MEQ PO TBCR
20.0000 meq | EXTENDED_RELEASE_TABLET | Freq: Once | ORAL | Status: AC
  Administered 2021-05-20: 20 meq via ORAL
  Filled 2021-05-20: qty 1

## 2021-05-20 MED ORDER — FUROSEMIDE 10 MG/ML IJ SOLN
60.0000 mg | Freq: Two times a day (BID) | INTRAMUSCULAR | Status: DC
  Administered 2021-05-21: 60 mg via INTRAVENOUS
  Filled 2021-05-20: qty 6

## 2021-05-20 NOTE — Plan of Care (Signed)

## 2021-05-20 NOTE — ED Provider Notes (Signed)
?[facility] ?Provider Note ? ? ?CSN: [id number] ?Arrival date & time: 05/20/21  1454 ? ?  ? ?History ? ?Chief Complaint  ?Patient presents with  ? Shortness of Breath  ? ? ?Charles Lester is a 88 y.o. male.  He has a history of congestive heart failure with reduced ejection fraction, aortic stenosis moderate to severe, hypertension diabetes.  Complaining of 1 week of increased shortness of breath cough productive of some clear sputum.  Denies any chest pain or fever.  No nausea vomiting.  Symptoms worse with activity.  He states he has been taking his regular medications.  He has had this before and states he was admitted a few months ago for same.  Does not normally require oxygen and EMS found with a saturation of 70% on room air placed on nonrebreather. ? ?The history is provided by the patient and the EMS personnel.  ?Shortness of Breath ?Severity:  Moderate ?Onset quality:  Gradual ?Duration:  7 weeks ?Timing:  Constant ?Progression:  Worsening ?Chronicity:  Recurrent ?Context: activity   ?Relieved by:  Nothing ?Worsened by:  Activity ?Ineffective treatments:  Rest and diuretics ?Associated symptoms: cough and sputum production   ?Associated symptoms: no abdominal pain, no chest pain, no fever, no headaches, no hemoptysis, no neck pain, no rash, no sore throat and no vomiting   ?Risk factors: no tobacco use   ? ?  ? ?Home Medications ?Prior to Admission medications   ?Medication Sig Start Date End Date Taking? Authorizing Provider  ?aspirin EC 81 MG tablet Take 1 tablet (81 mg total) by mouth daily. 10/22/20   Verta Ellen., NP  ?atorvastatin (LIPITOR) 20 MG tablet Take 1 tablet (20 mg total) by mouth daily. 10/22/20   Verta Ellen., NP  ?Cholecalciferol (VITAMIN D3) 10 MCG (400 UNIT) CAPS Take 1 capsule by mouth daily.    [provider]  ?clopidogrel (PLAVIX) 75 MG tablet Take 1 tablet (75 mg total) by mouth daily. 10/22/20   Verta Ellen., NP  ?ferrous sulfate 325  (65 FE) MG tablet Take 325 mg by mouth daily with breakfast.    [provider]  ?finasteride (PROSCAR) 5 MG tablet Take 1 tablet by mouth daily. 06/11/12   [provider]  ?furosemide (LASIX) 20 MG tablet Take 1 tablet (20 mg total) by mouth daily. 03/08/21   Deatra James, MD  ?hydrALAZINE (APRESOLINE) 10 MG tablet Take 1 tablet (10 mg total) by mouth 2 (two) times daily. 03/07/21 04/06/21  Deatra James, MD  ?isosorbide mononitrate (IMDUR) 30 MG 24 hr tablet Take 0.5 tablets (15 mg total) by mouth daily. 05/02/21 06/01/21  Erma Heritage, PA-C  ?metoprolol succinate (TOPROL-XL) 50 MG 24 hr tablet Take 1 tablet (50 mg total) by mouth daily. Take with or immediately following a meal. 05/02/21 06/01/21  Strader, Fransisco Hertz, PA-C  ?niacin (NIASPAN) 1000 MG CR tablet Take 1,000 mg by mouth daily. 03/27/19   [provider]  ?Omega-3 Fatty Acids (FISH OIL) 1000 MG CAPS Take by mouth.    [provider]  ?tamsulosin (FLOMAX) 0.4 MG CAPS Take 1 capsule by mouth in the morning and at bedtime. 06/01/12   [provider]  ?vitamin C (ASCORBIC ACID) 500 MG tablet Take 500 mg by mouth daily.    [provider]  ?   ? ?Allergies    ?Patient has no known allergies.   ? ?Review of Systems   ?Review of  Systems  ?Constitutional:  Negative for fever.  ?HENT:  Negative for sore throat.   ?Eyes:  Negative for visual disturbance.  ?Respiratory:  Positive for cough, sputum production and shortness of breath. Negative for hemoptysis.   ?Cardiovascular:  Negative for chest pain.  ?Gastrointestinal:  Negative for abdominal pain and vomiting.  ?Genitourinary:  Negative for dysuria.  ?Musculoskeletal:  Negative for neck pain.  ?Skin:  Negative for rash.  ?Neurological:  Negative for headaches.  ? ?Physical Exam ?Updated Vital Signs ?BP (!) 116/58   Pulse (!) 106   Temp 97.7 ?F (36.5 ?C)   Resp 18   Ht '5\' 7"'$  (1.702 m)   Wt 63.5 kg   SpO2 100%   BMI 21.93 kg/m?  ?Physical Exam ?Vitals  and nursing note reviewed.  ?Constitutional:   ?   General: He is not in acute distress. ?   Appearance: He is well-developed.  ?HENT:  ?   Head: Normocephalic and atraumatic.  ?Eyes:  ?   Conjunctiva/sclera: Conjunctivae normal.  ?Cardiovascular:  ?   Rate and Rhythm: Regular rhythm. Tachycardia present.  ?   Heart sounds: No murmur heard. ?Pulmonary:  ?   Effort: Tachypnea and accessory muscle usage present. No respiratory distress.  ?   Breath sounds: Rales present.  ?Abdominal:  ?   Palpations: Abdomen is soft.  ?   Tenderness: There is no abdominal tenderness.  ?Musculoskeletal:     ?   General: No swelling.  ?   Cervical back: Neck supple.  ?   Right lower leg: No tenderness. Edema present.  ?   Left lower leg: No tenderness. Edema present.  ?   Comments: Trace edema bilaterally no cords appreciated  ?Skin: ?   General: Skin is warm and dry.  ?   Capillary Refill: Capillary refill takes less than 2 seconds.  ?Neurological:  ?   General: No focal deficit present.  ?   Mental Status: He is alert.  ? ? ?ED Results / Procedures / Treatments   ?Labs ?(all labs ordered are listed, but only abnormal results are displayed) ?Labs Reviewed  ?BASIC METABOLIC PANEL - Abnormal; Notable for the following components:  ?    Result Value  ? Sodium 132 (*)   ? CO2 21 (*)   ? Glucose, Bld 246 (*)   ? BUN 47 (*)   ? Creatinine, Ser 1.95 (*)   ? GFR, Estimated 31 (*)   ? All other components within normal limits  ?BRAIN NATRIURETIC PEPTIDE - Abnormal; Notable for the following components:  ? B Natriuretic Peptide 1,127.0 (*)   ? All other components within normal limits  ?CBC - Abnormal; Notable for the following components:  ? RBC 3.53 (*)   ? Hemoglobin 11.4 (*)   ? HCT 32.7 (*)   ? Platelets 130 (*)   ? All other components within normal limits  ?BASIC METABOLIC PANEL - Abnormal; Notable for the following components:  ? Potassium 5.7 (*)   ? CO2 21 (*)   ? Glucose, Bld 165 (*)   ? BUN 47 (*)   ? Creatinine, Ser 1.84 (*)   ?  GFR, Estimated 34 (*)   ? All other components within normal limits  ?CBC - Abnormal; Notable for the following components:  ? RBC 3.31 (*)   ? Hemoglobin 10.9 (*)   ? HCT 31.5 (*)   ? Platelets 123 (*)   ? All other components within normal limits  ?RESP PANEL BY RT-PCR (  FLU A&B, COVID) ARPGX2  ?MRSA NEXT GEN BY PCR, NASAL  ?MAGNESIUM  ?PROTIME-INR  ?MAGNESIUM  ?POTASSIUM  ?POTASSIUM  ?POTASSIUM  ?POTASSIUM  ? ? ?EKG ?EKG Interpretation ? ?Date/Time:  Friday May 20 2021 15:10:31 EDT ?Ventricular Rate:  107 ?PR Interval:    ?QRS Duration: 114 ?QT Interval:  356 ?QTC Calculation: 475 ?R Axis:   83 ?Text Interpretation: Sinus tachycardia Anteroseptal infarct, old Repol abnrm suggests ischemia, lateral leads increased rate? and ischemic changes from prior 2/23 Confirmed by Aletta Edouard [phone]) on 05/20/2021 3:15:54 PM ? ?Radiology ?DG Chest Portable 1 View ? ?Result Date: 05/20/2021 ?CLINICAL DATA:  Short of breath EXAM: PORTABLE CHEST 1 VIEW COMPARISON:  03/05/2021 FINDINGS: Normal cardiac silhouette. Bilateral pleural effusions similar prior. No focal consolidation. No pneumothorax. Mild interstitial pattern is increased from prior. IMPRESSION: Pleural effusions and mild interstitial edema. Electronically Signed   By: Suzy Bouchard M.D.   On: 05/20/2021 15:55   ? ?Procedures ?Marland KitchenCritical Care ?Performed by: Hayden Rasmussen, MD ?Authorized by: Hayden Rasmussen, MD  ? ?Critical care provider statement:  ?  Critical care time (minutes):  45 ?  Critical care time was exclusive of:  Separately billable procedures and treating other patients ?  Critical care was necessary to treat or prevent imminent or life-threatening deterioration of the following conditions:  Respiratory failure and cardiac failure ?  Critical care was time spent personally by me on the following activities:  Development of treatment plan with patient or surrogate, discussions with consultants, evaluation of patient's response to treatment,  examination of patient, obtaining history from patient or surrogate, ordering and performing treatments and interventions, ordering and review of laboratory studies, ordering and review of radiographic studies, pu

## 2021-05-20 NOTE — ED Notes (Signed)
NRB removed and oxygen via N/C placed on patient at 4LPM at this time. Patient tolerating well.  ?

## 2021-05-20 NOTE — Progress Notes (Signed)
Placed patient on BiPAP upon arrival to unit. Patient was on NRB mask , saturation appear good but patient work of breathing was greatly increased and he could not sit back in bed. BiPAP 14/ 6 f 20 FiO2 40 .  ?

## 2021-05-20 NOTE — H&P (Signed)
?History and Physical  ? ? ?Patient: Charles Lester NHA:[id number] DOB: 1924-05-19 ?DOA: 05/20/2021 ?DOS: the patient was seen and examined on 05/20/2021 ?PCP: [name], MD  ?Patient coming from: Home ? ?Chief Complaint:  ?Chief Complaint  ?Patient presents with  ? Shortness of Breath  ? ?HPI: Charles Lester is a 88 y.o. male with medical history significant of coronary artery disease with cardiomyopathy and heart failure with reduced EF of 30 to 35% on echo on 2/23 and moderate aortic stenosis, peripheral artery disease, type 2 diabetes that is well controlled with diet.  Patient presents with 1 week of increasing shortness of breath with cough and clear sputum.  His symptoms are worse with movement and lying flat.  It is improved with rest and sitting straight up.  He states that he was recently admitted for similar symptoms couple months ago.  On arrival to the emergency department, he was found to have an oxygen saturation of 70% and was placed on nonrebreather with good response.  He does desaturate fairly quickly with any sort of movement.  No fevers, chills, nausea, vomiting, chest pain. ? ?Review of Systems: As mentioned in the history of present illness. All other systems reviewed and are negative. ?Past Medical History:  ?Diagnosis Date  ? Aortic stenosis   ? a. Mild to moderate 2017 b. echo in 03/2021 showing moderate AS and moderate to severe AI  ? Arthritis   ? CAD (coronary artery disease)   ? Cardiomyopathy ([place])   ? a. EF 30-35% by echo in 03/2020. Patient declined further work-up in the setting of his advanced age.  ? Essential hypertension   ? Hyperlipidemia   ? Ischemic heart disease   ? Based on Myoview 2010 revealing inferolateral scar but no active ischemia  ? PAD (peripheral artery disease) ([place])   ? Type 2 diabetes mellitus ([place])   ? ?Past Surgical History:  ?Procedure Laterality Date  ? NO PAST SURGERIES    ? ?Social History:  reports that he quit smoking about 16 years ago. His smoking use  included cigarettes. He has never used smokeless tobacco. He reports that he does not drink alcohol and does not use drugs. ? ?No Known Allergies ? ?Family History  ?Problem Relation Age of Onset  ? Hypertension Father   ? Hypertension Sister   ? Hypertension Brother   ? Coronary artery disease Neg Hx   ? ? ?Prior to Admission medications   ?Medication Sig Start Date End Date Taking? Authorizing Provider  ?amLODipine (NORVASC) 2.5 MG tablet Take 2.5 mg by mouth daily. 05/02/21  Yes [provider]  ?aspirin EC 81 MG tablet Take 1 tablet (81 mg total) by mouth daily. 10/22/20  Yes Verta Ellen., NP  ?atorvastatin (LIPITOR) 20 MG tablet Take 1 tablet (20 mg total) by mouth daily. 10/22/20  Yes Verta Ellen., NP  ?Cholecalciferol (VITAMIN D3) 10 MCG (400 UNIT) CAPS Take 400 Units by mouth daily.   Yes [provider]  ?clopidogrel (PLAVIX) 75 MG tablet Take 1 tablet (75 mg total) by mouth daily. 10/22/20  Yes Verta Ellen., NP  ?ferrous sulfate 325 (65 FE) MG tablet Take 325 mg by mouth daily with breakfast.   Yes [provider]  ?finasteride (PROSCAR) 5 MG tablet Take 5 mg by mouth daily. 06/11/12  Yes [provider]  ?furosemide (LASIX) 20 MG tablet Take 1 tablet (20 mg total) by mouth daily. 03/08/21  Yes Shahmehdi, Seyed A,  MD  ?hydrALAZINE (APRESOLINE) 10 MG tablet Take 1 tablet (10 mg total) by mouth 2 (two) times daily. 03/07/21 05/20/21 Yes Shahmehdi, Valeria Batman, MD  ?isosorbide mononitrate (IMDUR) 30 MG 24 hr tablet Take 0.5 tablets (15 mg total) by mouth daily. 05/02/21 06/01/21 Yes Strader, Fransisco Hertz, PA-C  ?lisinopril (ZESTRIL) 40 MG tablet Take 40 mg by mouth daily. 05/02/21  Yes [provider]  ?metoprolol succinate (TOPROL-XL) 50 MG 24 hr tablet Take 1 tablet (50 mg total) by mouth daily. Take with or immediately following a meal. 05/02/21 06/01/21 Yes Strader, Fransisco Hertz, PA-C  ?niacin (NIASPAN) 1000 MG CR tablet Take 1,000 mg by mouth daily. 03/27/19  Yes  [provider]  ?Omega-3 Fatty Acids (FISH OIL) 1000 MG CAPS Take 1,000 mg by mouth daily.   Yes [provider]  ?tamsulosin (FLOMAX) 0.4 MG CAPS Take 0.4 mg by mouth in the morning and at bedtime. 06/01/12  Yes [provider]  ?vitamin C (ASCORBIC ACID) 500 MG tablet Take 500 mg by mouth daily.   Yes [provider]  ? ? ?Physical Exam: ?Vitals:  ? 05/20/21 1630 05/20/21 1700 05/20/21 1730 05/20/21 1800  ?BP: 104/61 124/74 118/68 118/75  ?Pulse: 98 98    ?Resp: (!) 25 (!) 24 (!) 24 20  ?Temp:      ?SpO2: 100% 98%    ?Weight:      ?Height:      ? ?General: Elderly male. Awake and alert and oriented x3. No acute cardiopulmonary distress.  ?HEENT: Normocephalic atraumatic.  Right and left ears normal in appearance.  Pupils equal, round, reactive to light. Extraocular muscles are intact. Sclerae anicteric and noninjected.  Moist mucosal membranes. No mucosal lesions.  ?Neck: Neck supple without lymphadenopathy. No carotid bruits. No masses palpated.  ?Cardiovascular: Regular rate with normal S1-S2 sounds. No murmurs, rubs, gallops auscultated. No JVD.  ?Respiratory: Diminished breath sounds.  Rales in bases bilaterally.  He gets extremely short of breath with any sort of movement..  No accessory muscle use. ?Abdomen: Soft, nontender, nondistended. Active bowel sounds. No masses or hepatosplenomegaly  ?Skin: No rashes, lesions.  Stage II sacral ulcer.  Dry, warm to touch. 2+ dorsalis pedis and radial pulses. ?Musculoskeletal: No calf or leg pain. All major joints not erythematous nontender.  No upper or lower joint deformation.  Good ROM.  No contractures  ?Psychiatric: Intact judgment and insight. Pleasant and cooperative. ?Neurologic: No focal neurological deficits. Strength is 5/5 and symmetric in upper and lower extremities.  Cranial nerves II through XII are grossly intact. ? ?Data Reviewed: ?Results for orders placed or performed during the hospital encounter of 05/20/21 (from  the past 24 hour(s))  ?Brain natriuretic peptide (order ONLY if patient c/o SOB)     Status: Abnormal  ? Collection Time: 05/20/21  1:07 PM  ?Result Value Ref Range  ? B Natriuretic Peptide 1,127.0 (H) 0.0 - 100.0 pg/mL  ?Basic metabolic panel     Status: Abnormal  ? Collection Time: 05/20/21  3:07 PM  ?Result Value Ref Range  ? Sodium 132 (L) 135 - 145 mmol/L  ? Potassium 4.3 3.5 - 5.1 mmol/L  ? Chloride 102 98 - 111 mmol/L  ? CO2 21 (L) 22 - 32 mmol/L  ? Glucose, Bld 246 (H) 70 - 99 mg/dL  ? BUN 47 (H) 8 - 23 mg/dL  ? Creatinine, Ser 1.95 (H) 0.61 - 1.24 mg/dL  ? Calcium 9.2 8.9 - 10.3 mg/dL  ? GFR, Estimated 31 (L) >  60 mL/min  ? Anion gap 9 5 - 15  ?Magnesium     Status: None  ? Collection Time: 05/20/21  3:07 PM  ?Result Value Ref Range  ? Magnesium 2.0 1.7 - 2.4 mg/dL  ?CBC     Status: Abnormal  ? Collection Time: 05/20/21  3:07 PM  ?Result Value Ref Range  ? WBC 5.3 4.0 - 10.5 K/uL  ? RBC 3.53 (L) 4.22 - 5.81 MIL/uL  ? Hemoglobin 11.4 (L) 13.0 - 17.0 g/dL  ? HCT 32.7 (L) 39.0 - 52.0 %  ? MCV 92.6 80.0 - 100.0 fL  ? MCH 32.3 26.0 - 34.0 pg  ? MCHC 34.9 30.0 - 36.0 g/dL  ? RDW 14.4 11.5 - 15.5 %  ? Platelets 130 (L) 150 - 400 K/uL  ? nRBC 0.0 0.0 - 0.2 %  ?Protime-INR     Status: None  ? Collection Time: 05/20/21  3:07 PM  ?Result Value Ref Range  ? Prothrombin Time 14.8 11.4 - 15.2 seconds  ? INR 1.2 0.8 - 1.2  ?Resp Panel by RT-PCR (Flu A&B, Covid) Nasopharyngeal Swab     Status: None  ? Collection Time: 05/20/21  3:23 PM  ? Specimen: Nasopharyngeal Swab; Nasopharyngeal(NP) swabs in vial transport medium  ?Result Value Ref Range  ? SARS Coronavirus 2 by RT PCR NEGATIVE NEGATIVE  ? Influenza A by PCR NEGATIVE NEGATIVE  ? Influenza B by PCR NEGATIVE NEGATIVE  ? ?DG Chest Portable 1 View ? ?Result Date: 05/20/2021 ?CLINICAL DATA:  Short of breath EXAM: PORTABLE CHEST 1 VIEW COMPARISON:  03/05/2021 FINDINGS: Normal cardiac silhouette. Bilateral pleural effusions similar prior. No focal consolidation. No  pneumothorax. Mild interstitial pattern is increased from prior. IMPRESSION: Pleural effusions and mild interstitial edema. Electronically Signed   By: Suzy Bouchard M.D.   On: 05/20/2021 15:55   ? ? ?Assessment an

## 2021-05-20 NOTE — ED Triage Notes (Signed)
Patient recently diagnosed with CHF. EMS reports that patient was 70% at one time on RA. Noted to be coughing. Labored breathing noted. States that he has been SOB for the past week.  ?

## 2021-05-21 DIAGNOSIS — J9601 Acute respiratory failure with hypoxia: Secondary | ICD-10-CM | POA: Diagnosis not present

## 2021-05-21 LAB — CBC
HCT: 31.5 % — ABNORMAL LOW (ref 39.0–52.0)
Hemoglobin: 10.9 g/dL — ABNORMAL LOW (ref 13.0–17.0)
MCH: 32.9 pg (ref 26.0–34.0)
MCHC: 34.6 g/dL (ref 30.0–36.0)
MCV: 95.2 fL (ref 80.0–100.0)
Platelets: 123 10*3/uL — ABNORMAL LOW (ref 150–400)
RBC: 3.31 MIL/uL — ABNORMAL LOW (ref 4.22–5.81)
RDW: 14.4 % (ref 11.5–15.5)
WBC: 5 10*3/uL (ref 4.0–10.5)
nRBC: 0 % (ref 0.0–0.2)

## 2021-05-21 LAB — BASIC METABOLIC PANEL
Anion gap: 8 (ref 5–15)
BUN: 47 mg/dL — ABNORMAL HIGH (ref 8–23)
CO2: 21 mmol/L — ABNORMAL LOW (ref 22–32)
Calcium: 9.3 mg/dL (ref 8.9–10.3)
Chloride: 107 mmol/L (ref 98–111)
Creatinine, Ser: 1.84 mg/dL — ABNORMAL HIGH (ref 0.61–1.24)
GFR, Estimated: 34 mL/min — ABNORMAL LOW (ref >60)
Glucose, Bld: 165 mg/dL — ABNORMAL HIGH (ref 70–99)
Potassium: 5.7 mmol/L — ABNORMAL HIGH (ref 3.5–5.1)
Sodium: 136 mmol/L (ref 135–145)

## 2021-05-21 LAB — POTASSIUM: Potassium: 5.2 mmol/L — ABNORMAL HIGH (ref 3.5–5.1)

## 2021-05-21 LAB — MRSA NEXT GEN BY PCR, NASAL: MRSA by PCR Next Gen: NOT DETECTED

## 2021-05-21 LAB — MAGNESIUM: Magnesium: 2.4 mg/dL (ref 1.7–2.4)

## 2021-05-21 MED ORDER — GUAIFENESIN-DM 100-10 MG/5ML PO SYRP
5.0000 mL | ORAL_SOLUTION | ORAL | Status: DC | PRN
  Administered 2021-05-21 – 2021-05-24 (×2): 5 mL via ORAL
  Filled 2021-05-21 (×2): qty 5

## 2021-05-21 MED ORDER — METOPROLOL SUCCINATE ER 25 MG PO TB24: 12.5000 mg | ORAL_TABLET | Freq: Every day | ORAL | Status: DC

## 2021-05-21 MED ORDER — INSULIN ASPART 100 UNIT/ML IV SOLN
5.0000 [IU] | Freq: Once | INTRAVENOUS | Status: AC
  Administered 2021-05-21: 5 [IU] via INTRAVENOUS

## 2021-05-21 MED ORDER — FUROSEMIDE 10 MG/ML IJ SOLN
40.0000 mg | Freq: Two times a day (BID) | INTRAMUSCULAR | Status: DC
  Filled 2021-05-21 (×2): qty 4

## 2021-05-21 MED ORDER — SODIUM ZIRCONIUM CYCLOSILICATE 10 G PO PACK
10.0000 g | PACK | Freq: Once | ORAL | Status: AC
  Administered 2021-05-21: 10 g via ORAL
  Filled 2021-05-21: qty 1

## 2021-05-21 MED ORDER — ORAL CARE MOUTH RINSE
15.0000 mL | Freq: Two times a day (BID) | OROMUCOSAL | Status: DC
  Administered 2021-05-21 – 2021-05-31 (×17): 15 mL via OROMUCOSAL

## 2021-05-21 MED ORDER — DEXTROSE 50 % IV SOLN
INTRAVENOUS | Status: AC
  Filled 2021-05-21: qty 50

## 2021-05-21 MED ORDER — DEXTROSE 50 % IV SOLN
1.0000 | Freq: Once | INTRAVENOUS | Status: AC
  Administered 2021-05-21: 50 mL via INTRAVENOUS
  Filled 2021-05-21: qty 50

## 2021-05-21 NOTE — Plan of Care (Signed)
Patient continues to require supplemental O2 overnight. BP remains stably soft; asymptomatic. Oral care bundle and fall bundles initiated by this RN tonight (had not previously been addressed). Q 2 hours turns initiated by this RN (had not been previously documented).  ? ? ?Problem: Education: ?Goal: Ability to demonstrate management of disease process will improve ?Outcome: Progressing ?Goal: Ability to verbalize understanding of medication therapies will improve ?Outcome: Progressing ?Goal: Individualized Educational Video(s) ?Outcome: Progressing ?  ?Problem: Activity: ?Goal: Capacity to carry out activities will improve ?Outcome: Progressing ?  ?Problem: Cardiac: ?Goal: Ability to achieve and maintain adequate cardiopulmonary perfusion will improve ?Outcome: Progressing ?  ?Problem: Education: ?Goal: Knowledge of General Education information will improve ?Description: Including pain rating scale, medication(s)/side effects and non-pharmacologic comfort measures ?Outcome: Progressing ?  ?Problem: Health Behavior/Discharge Planning: ?Goal: Ability to manage health-related needs will improve ?Outcome: Progressing ?  ?Problem: Clinical Measurements: ?Goal: Ability to maintain clinical measurements within normal limits will improve ?Outcome: Progressing ?Goal: Will remain free from infection ?Outcome: Progressing ?Goal: Diagnostic test results will improve ?Outcome: Progressing ?Goal: Respiratory complications will improve ?Outcome: Progressing ?Goal: Cardiovascular complication will be avoided ?Outcome: Progressing ?  ?Problem: Activity: ?Goal: Risk for activity intolerance will decrease ?Outcome: Progressing ?  ?Problem: Nutrition: ?Goal: Adequate nutrition will be maintained ?Outcome: Progressing ?  ?Problem: Coping: ?Goal: Level of anxiety will decrease ?Outcome: Progressing ?  ?Problem: Elimination: ?Goal: Will not experience complications related to bowel motility ?Outcome: Progressing ?Goal: Will not  experience complications related to urinary retention ?Outcome: Progressing ?  ?Problem: Pain Managment: ?Goal: General experience of comfort will improve ?Outcome: Progressing ?  ?Problem: Safety: ?Goal: Ability to remain free from injury will improve ?Outcome: Progressing ?  ?Problem: Skin Integrity: ?Goal: Risk for impaired skin integrity will decrease ?Outcome: Progressing ?  ?

## 2021-05-21 NOTE — Progress Notes (Signed)
Unable to obtain potassium lab.  MD notified.  Lab discontinued.  Per MD, monitor BP and notify if patient becomes symptomatic.  See lasix order changes.  Patient has limited venous access.  Patient and patient's daughter notified of the changes.    ?

## 2021-05-21 NOTE — Progress Notes (Signed)
MD notified of Low BP, brief switch to afib then back to SR with PACs and PVCs, HR 80s-90, and multiple failed attempts to obtain second lactic acid blood sample by phlebotomy and nursing staff.   ?

## 2021-05-21 NOTE — Progress Notes (Signed)
?PROGRESS NOTE ? ? ? ?Charles Lester  EYC:[id number] DOB: 10/08/1924 DOA: 05/20/2021 ?PCP: [name], MD ? ? ?Brief Narrative:  ?Charles Lester is a 88 y.o. male with medical history significant of coronary artery disease with cardiomyopathy and heart failure with reduced EF of 30 to 35% on echo on 2/23 and moderate aortic stenosis, peripheral artery disease, type 2 diabetes that is well controlled with diet.   ? ?Patient presents with 1 week of worsening shortness of breath sputum production orthopnea and dyspnea with exertion.  Noted to be markedly hypoxic in the 70s at intake requiring nonrebreather and subsequently BiPAP for respiratory support.  Patient admitted for close monitoring, aggressive diuresis and respiratory support in the setting of heart failure exacerbation. ? ?Assessment & Plan: ?  ?Principal Problem: ?  Acute respiratory failure with hypoxia ([place]) ?Active Problems: ?  DMII (diabetes mellitus, type 2) ([place]) ?  HTN (hypertension) ?  CORONARY ATHEROSCLEROSIS NATIVE CORONARY ARTERY ?  Diastolic congestive heart failure ([place]) ?  Acute clinical systolic heart failure ([place]) ?  Sacral decubitus ulcer, stage II ([place]) ? ? ?Acute hypoxic respiratory failure in the setting of combined systolic/diastolic heart failure exacerbation, POA ?-Continue to diurese aggressively, respiratory status resolving with diuretics as expected ?-Continue to follow clinically, holding routine home blood pressure medications to ensure room for aggressive diuresis ? ?History of CAD, stable -continue home medications including aspirin, statin, Plavix ?Non-insulin-dependent diabetes mellitus type 2  -diet controlled, sliding scale insulin hypoglycemic protocol while admitted ?Essential hypertension -currently hypotensive in the setting of above with aggressive diuresis ?Sacral decubitus ulcer stage II, chronic, POA -continue to follow clinically ?Aortic stenosis -continue to monitor blood pressure control as above ?Peripheral  vascular disease -aspirin Plavix statin ongoing as above ? ?DVT prophylaxis: Lovenox ?Code Status: DNR ?Family Communication: None present ? ?Status is: Inpatient ? ?Dispo: The patient is from: Home ?             Anticipated d/c is to: To be determined ?             Anticipated d/c date is: 48 to 72 hours ?             Patient currently not medically stable for discharge ? ?Consultants:  ?None ? ?Procedures:  ?None ? ?Antimicrobials:  ?None indicated ? ?Subjective: ?No acute issues or events overnight ? ?Objective: ?Vitals:  ? 05/21/21 0517 05/21/21 0600 05/21/21 0700 05/21/21 0740  ?BP:  (!) 111/53 109/70   ?Pulse:      ?Resp:    (!) 32  ?Temp: (!) 96.6 ?F (35.9 ?C)   (!) 97.5 ?F (36.4 ?C)  ?TempSrc: Axillary   Axillary  ?SpO2:      ?Weight:      ?Height:      ? ? ?Intake/Output Summary (Last 24 hours) at 05/21/2021 0752 ?Last data filed at 05/21/2021 0400 ?Gross per 24 hour  ?Intake --  ?Output 350 ml  ?Net -350 ml  ? ?Filed Weights  ? 05/20/21 1502 05/20/21 2051 05/21/21 0500  ?Weight: 63.5 kg 63.6 kg 64.2 kg  ? ? ?Examination: ? ?General:  Pleasantly resting in bed, No acute distress. ?HEENT:  Normocephalic atraumatic.  Sclerae nonicteric, noninjected.  Extraocular movements intact bilaterally. ?Neck:  Without mass or deformity.  Trachea is midline. ?Lungs: Bibasilar rales ?Heart:  Regular rate and rhythm.  Without murmurs, rubs, or gallops. ?Abdomen:  Soft, nontender, nondistended.  Without guarding or rebound. ?Extremities: 2+ pitting edema bilateral lower extremities ?Vascular:  Dorsalis  pedis and posterior tibial pulses palpable bilaterally. ?Skin:  Warm and dry, no erythema, stage II sacral decubitus ulcer as above. ? ?Data Reviewed: I have personally reviewed following labs and imaging studies ? ?CBC: ?Recent Labs  ?Lab 05/20/21 ?1507 05/21/21 ?0440  ?WBC 5.3 5.0  ?HGB 11.4* 10.9*  ?HCT 32.7* 31.5*  ?MCV 92.6 95.2  ?PLT 130* 123*  ? ?Basic Metabolic Panel: ?Recent Labs  ?Lab 05/20/21 ?1507 05/21/21 ?0440   ?NA 132* 136  ?K 4.3 5.7*  ?CL 102 107  ?CO2 21* 21*  ?GLUCOSE 246* 165*  ?BUN 47* 47*  ?CREATININE 1.95* 1.84*  ?CALCIUM 9.2 9.3  ?MG 2.0 2.4  ? ?GFR: ?Estimated Creatinine Clearance: 22.3 mL/min (A) (by C-G formula based on SCr of 1.84 mg/dL (H)). ?Liver Function Tests: ?No results for input(s): AST, ALT, ALKPHOS, BILITOT, PROT, ALBUMIN in the last 168 hours. ?No results for input(s): LIPASE, AMYLASE in the last 168 hours. ?No results for input(s): AMMONIA in the last 168 hours. ?Coagulation Profile: ?Recent Labs  ?Lab 05/20/21 ?1507  ?INR 1.2  ? ?Cardiac Enzymes: ?No results for input(s): CKTOTAL, CKMB, CKMBINDEX, TROPONINI in the last 168 hours. ?BNP (last 3 results) ?No results for input(s): PROBNP in the last 8760 hours. ?HbA1C: ?No results for input(s): HGBA1C in the last 72 hours. ?CBG: ?No results for input(s): GLUCAP in the last 168 hours. ?Lipid Profile: ?No results for input(s): CHOL, HDL, LDLCALC, TRIG, CHOLHDL, LDLDIRECT in the last 72 hours. ?Thyroid Function Tests: ?No results for input(s): TSH, T4TOTAL, FREET4, T3FREE, THYROIDAB in the last 72 hours. ?Anemia Panel: ?No results for input(s): VITAMINB12, FOLATE, FERRITIN, TIBC, IRON, RETICCTPCT in the last 72 hours. ?Sepsis Labs: ?No results for input(s): PROCALCITON, LATICACIDVEN in the last 168 hours. ? ?Recent Results (from the past 240 hour(s))  ?Resp Panel by RT-PCR (Flu A&B, Covid) Nasopharyngeal Swab     Status: None  ? Collection Time: 05/20/21  3:23 PM  ? Specimen: Nasopharyngeal Swab; Nasopharyngeal(NP) swabs in vial transport medium  ?Result Value Ref Range Status  ? SARS Coronavirus 2 by RT PCR NEGATIVE NEGATIVE Final  ?  Comment: (NOTE) ?SARS-CoV-2 target nucleic acids are NOT DETECTED. ? ?The SARS-CoV-2 RNA is generally detectable in upper respiratory ?specimens during the acute phase of infection. The lowest ?concentration of SARS-CoV-2 viral copies this assay can detect is ?138 copies/mL. A negative result does not preclude  SARS-Cov-2 ?infection and should not be used as the sole basis for treatment or ?other patient management decisions. A negative result may occur with  ?improper specimen collection/handling, submission of specimen other ?than nasopharyngeal swab, presence of viral mutation(s) within the ?areas targeted by this assay, and inadequate number of viral ?copies(<138 copies/mL). A negative result must be combined with ?clinical observations, patient history, and epidemiological ?information. The expected result is Negative. ? ?Fact Sheet for Patients:  ?EntrepreneurPulse.com.au ? ?Fact Sheet for Healthcare Providers:  ?IncredibleEmployment.be ? ?This test is no t yet approved or cleared by the [place] FDA and  ?has been authorized for detection and/or diagnosis of SARS-CoV-2 by ?FDA under an Emergency Use Authorization (EUA). This EUA will remain  ?in effect (meaning this test can be used) for the duration of the ?COVID-19 declaration under Section 564(b)(1) of the Act, 21 ?U.S.C.section 360bbb-3(b)(1), unless the authorization is terminated  ?or revoked sooner.  ? ? ?  ? Influenza A by PCR NEGATIVE NEGATIVE Final  ? Influenza B by PCR NEGATIVE NEGATIVE Final  ?  Comment: (NOTE) ?The Xpert Xpress SARS-CoV-2/FLU/RSV plus  assay is intended as an aid ?in the diagnosis of influenza from Nasopharyngeal swab specimens and ?should not be used as a sole basis for treatment. Nasal washings and ?aspirates are unacceptable for Xpert Xpress SARS-CoV-2/FLU/RSV ?testing. ? ?Fact Sheet for Patients: ?EntrepreneurPulse.com.au ? ?Fact Sheet for Healthcare Providers: ?IncredibleEmployment.be ? ?This test is not yet approved or cleared by the [place] FDA and ?has been authorized for detection and/or diagnosis of SARS-CoV-2 by ?FDA under an Emergency Use Authorization (EUA). This EUA will remain ?in effect (meaning this test can be used) for the duration of  the ?COVID-19 declaration under Section 564(b)(1) of the Act, 21 U.S.C. ?section 360bbb-3(b)(1), unless the authorization is terminated or ?revoked. ? ?Performed at [facility], [address], [place], [place] [address] ?

## 2021-05-22 DIAGNOSIS — J9601 Acute respiratory failure with hypoxia: Secondary | ICD-10-CM | POA: Diagnosis not present

## 2021-05-22 LAB — BASIC METABOLIC PANEL
Anion gap: 7 (ref 5–15)
BUN: 52 mg/dL — ABNORMAL HIGH (ref 8–23)
CO2: 22 mmol/L (ref 22–32)
Calcium: 9.1 mg/dL (ref 8.9–10.3)
Chloride: 105 mmol/L (ref 98–111)
Creatinine, Ser: 1.93 mg/dL — ABNORMAL HIGH (ref 0.61–1.24)
GFR, Estimated: 32 mL/min — ABNORMAL LOW (ref >60)
Glucose, Bld: 155 mg/dL — ABNORMAL HIGH (ref 70–99)
Potassium: 4.9 mmol/L (ref 3.5–5.1)
Sodium: 134 mmol/L — ABNORMAL LOW (ref 135–145)

## 2021-05-22 MED ORDER — IPRATROPIUM-ALBUTEROL 0.5-2.5 (3) MG/3ML IN SOLN
3.0000 mL | RESPIRATORY_TRACT | Status: DC | PRN
  Administered 2021-05-28 – 2021-05-30 (×4): 3 mL via RESPIRATORY_TRACT
  Filled 2021-05-22 (×4): qty 3

## 2021-05-22 MED ORDER — DILTIAZEM HCL 30 MG PO TABS
30.0000 mg | ORAL_TABLET | Freq: Three times a day (TID) | ORAL | Status: DC
  Administered 2021-05-22 – 2021-05-24 (×7): 30 mg via ORAL
  Filled 2021-05-22 (×7): qty 1

## 2021-05-22 MED ORDER — FUROSEMIDE 20 MG PO TABS
20.0000 mg | ORAL_TABLET | Freq: Every day | ORAL | Status: DC
  Administered 2021-05-22 – 2021-05-23 (×2): 20 mg via ORAL
  Filled 2021-05-22 (×2): qty 1

## 2021-05-22 MED ORDER — IPRATROPIUM-ALBUTEROL 0.5-2.5 (3) MG/3ML IN SOLN
RESPIRATORY_TRACT | Status: AC
  Administered 2021-05-22: 3 mL
  Filled 2021-05-22: qty 3

## 2021-05-22 MED ORDER — SODIUM CHLORIDE 0.9 % IV BOLUS
250.0000 mL | Freq: Once | INTRAVENOUS | Status: AC
  Administered 2021-05-22: 250 mL via INTRAVENOUS

## 2021-05-22 NOTE — Progress Notes (Signed)
Patient did well without BiPAP sleeping.  ?

## 2021-05-22 NOTE — Consult Note (Signed)
WOC Nurse Consult Note: ?Reason for Consult:Stage 2 pressure injury to sacrum ?Wound type:Pressure ?Pressure Injury POA: Yes ?Measurement: Per Bedside RN, Shawnee Knapp on 05/20/21: 0.25cm x 0.3cm x 0cm ?Wound [id number] moist ?Drainage (amount, consistency, odor) scant serous ?Periwound: Intact, moist ?Dressing procedure/placement/frequency: Cleanse with NS, pat dry. Cover with size appropriate piece of xeroform gauze (antimicrobial nonadherent), top with dry gauze and cover with silicone foam for sacrum. Turn from side to side.  ?Prevalon boots for PI prevention to heels.  ? ?[place] nursing team will not follow, but will remain available to this patient, the nursing and medical teams.  Please re-consult if needed. ?Thanks, ?Maudie Flakes, MSN, RN, [place], [place], CWON-AP, [place]  ?Pager# [phone]  ? ? ?  ?

## 2021-05-22 NOTE — Progress Notes (Incomplete)
Patient's SBP noted to be running 80-90 with MAP >60. Additionally, no urine output noted within 8 hour, bladder scan noted to be 40 cc.  ?

## 2021-05-22 NOTE — Progress Notes (Signed)
?PROGRESS NOTE ? ? ? ?Charles Lester  QZR:[id number] DOB: 29-Sep-1924 DOA: 05/20/2021 ?PCP: [name], MD ? ? ?Brief Narrative:  ?Charles Lester is a 88 y.o. male with medical history significant of coronary artery disease with cardiomyopathy and heart failure with reduced EF of 30 to 35% on echo on 2/23 and moderate aortic stenosis, peripheral artery disease, type 2 diabetes that is well controlled with diet.   ? ?Patient presents with 1 week of worsening shortness of breath sputum production orthopnea and dyspnea with exertion.  Noted to be markedly hypoxic in the 70s at intake requiring nonrebreather and subsequently BiPAP for respiratory support.  Patient admitted for close monitoring, aggressive diuresis and respiratory support in the setting of heart failure exacerbation. ? ?Assessment & Plan: ?  ?Principal Problem: ?  Acute respiratory failure with hypoxia ([place]) ?Active Problems: ?  DMII (diabetes mellitus, type 2) ([place]) ?  HTN (hypertension) ?  CORONARY ATHEROSCLEROSIS NATIVE CORONARY ARTERY ?  Diastolic congestive heart failure ([place]) ?  Acute clinical systolic heart failure ([place]) ?  Sacral decubitus ulcer, stage II ([place]) ? ?Acute hypoxic respiratory failure in the setting of combined systolic/diastolic heart failure exacerbation, POA ?-Patient appears more euvolemic today, discontinue IV Lasix transition to p.o. home dosing ? ?History of CAD, stable -continue home medications including aspirin, statin, Plavix ?Non-insulin-dependent diabetes mellitus type 2  -diet controlled, sliding scale insulin hypoglycemic protocol while admitted ?Essential hypertension -borderline hypotensive with minimally elevated heart rate transition from metoprolol to diltiazem ?Sacral decubitus ulcer stage II, chronic, POA -continue to follow clinically ?Aortic stenosis -continue to monitor blood pressure control as above ?Peripheral vascular disease -aspirin Plavix statin ongoing as above ? ?DVT prophylaxis: Lovenox ?Code Status:  DNR ?Family Communication: None present ? ?Status is: Inpatient ? ?Dispo: The patient is from: Home ?             Anticipated d/c is to: To be determined ?             Anticipated d/c date is: 48 to 72 hours ?             Patient currently not medically stable for discharge ? ?Consultants:  ?None ? ?Procedures:  ?None ? ?Antimicrobials:  ?None indicated ? ?Subjective: ?No acute issues or events overnight ? ?Objective: ?Vitals:  ? 05/22/21 0300 05/22/21 0400 05/22/21 0450 05/22/21 0457  ?BP: (!) 93/52 (!) 84/49 (!) 109/51   ?Pulse: 87 81 95 89  ?Resp: 20 18 (!) 21 (!) 25  ?Temp:  (!) 97.5 ?F (36.4 ?C)    ?TempSrc:  Axillary    ?SpO2: 98% 99% 96% 97%  ?Weight:    64.1 kg  ?Height:      ? ? ?Intake/Output Summary (Last 24 hours) at 05/22/2021 0713 ?Last data filed at 05/21/2021 1026 ?Gross per 24 hour  ?Intake --  ?Output 600 ml  ?Net -600 ml  ? ? ?Filed Weights  ? 05/20/21 2051 05/21/21 0500 05/22/21 0457  ?Weight: 63.6 kg 64.2 kg 64.1 kg  ? ? ?Examination: ? ?General:  Pleasantly resting in bed, No acute distress. ?HEENT:  Normocephalic atraumatic.  Sclerae nonicteric, noninjected.  Extraocular movements intact bilaterally. ?Neck:  Without mass or deformity.  Trachea is midline. ?Lungs: Bibasilar rales ?Heart:  Regular rate and rhythm.  Without murmurs, rubs, or gallops. ?Abdomen:  Soft, nontender, nondistended.  Without guarding or rebound. ?Extremities: 2+ pitting edema bilateral lower extremities ?Vascular:  Dorsalis pedis and posterior tibial pulses palpable bilaterally. ?Skin:  Warm and dry,  no erythema, stage II sacral decubitus ulcer as above. ? ?Data Reviewed: I have personally reviewed following labs and imaging studies ? ?CBC: ?Recent Labs  ?Lab 05/20/21 ?1507 05/21/21 ?0440 05/22/21 ?0610  ?WBC 5.3 5.0 6.2  ?HGB 11.4* 10.9* 10.2*  ?HCT 32.7* 31.5* 30.1*  ?MCV 92.6 95.2 94.7  ?PLT 130* 123* 112*  ? ? ?Basic Metabolic Panel: ?Recent Labs  ?Lab 05/20/21 ?1507 05/21/21 ?0440 05/21/21 ?1044  ?NA 132* 136  --   ?K  4.3 5.7* 5.2*  ?CL 102 107  --   ?CO2 21* 21*  --   ?GLUCOSE 246* 165*  --   ?BUN 47* 47*  --   ?CREATININE 1.95* 1.84*  --   ?CALCIUM 9.2 9.3  --   ?MG 2.0 2.4  --   ? ? ?GFR: ?Estimated Creatinine Clearance: 22.3 mL/min (A) (by C-G formula based on SCr of 1.84 mg/dL (H)). ?Liver Function Tests: ?No results for input(s): AST, ALT, ALKPHOS, BILITOT, PROT, ALBUMIN in the last 168 hours. ?No results for input(s): LIPASE, AMYLASE in the last 168 hours. ?No results for input(s): AMMONIA in the last 168 hours. ?Coagulation Profile: ?Recent Labs  ?Lab 05/20/21 ?1507  ?INR 1.2  ? ? ?Cardiac Enzymes: ?No results for input(s): CKTOTAL, CKMB, CKMBINDEX, TROPONINI in the last 168 hours. ?BNP (last 3 results) ?No results for input(s): PROBNP in the last 8760 hours. ?HbA1C: ?No results for input(s): HGBA1C in the last 72 hours. ?CBG: ?No results for input(s): GLUCAP in the last 168 hours. ?Lipid Profile: ?No results for input(s): CHOL, HDL, LDLCALC, TRIG, CHOLHDL, LDLDIRECT in the last 72 hours. ?Thyroid Function Tests: ?No results for input(s): TSH, T4TOTAL, FREET4, T3FREE, THYROIDAB in the last 72 hours. ?Anemia Panel: ?No results for input(s): VITAMINB12, FOLATE, FERRITIN, TIBC, IRON, RETICCTPCT in the last 72 hours. ?Sepsis Labs: ?No results for input(s): PROCALCITON, LATICACIDVEN in the last 168 hours. ? ?Recent Results (from the past 240 hour(s))  ?Resp Panel by RT-PCR (Flu A&B, Covid) Nasopharyngeal Swab     Status: None  ? Collection Time: 05/20/21  3:23 PM  ? Specimen: Nasopharyngeal Swab; Nasopharyngeal(NP) swabs in vial transport medium  ?Result Value Ref Range Status  ? SARS Coronavirus 2 by RT PCR NEGATIVE NEGATIVE Final  ?  Comment: (NOTE) ?SARS-CoV-2 target nucleic acids are NOT DETECTED. ? ?The SARS-CoV-2 RNA is generally detectable in upper respiratory ?specimens during the acute phase of infection. The lowest ?concentration of SARS-CoV-2 viral copies this assay can detect is ?138 copies/mL. A negative result  does not preclude SARS-Cov-2 ?infection and should not be used as the sole basis for treatment or ?other patient management decisions. A negative result may occur with  ?improper specimen collection/handling, submission of specimen other ?than nasopharyngeal swab, presence of viral mutation(s) within the ?areas targeted by this assay, and inadequate number of viral ?copies(<138 copies/mL). A negative result must be combined with ?clinical observations, patient history, and epidemiological ?information. The expected result is Negative. ? ?Fact Sheet for Patients:  ?EntrepreneurPulse.com.au ? ?Fact Sheet for Healthcare Providers:  ?IncredibleEmployment.be ? ?This test is no t yet approved or cleared by the [place] FDA and  ?has been authorized for detection and/or diagnosis of SARS-CoV-2 by ?FDA under an Emergency Use Authorization (EUA). This EUA will remain  ?in effect (meaning this test can be used) for the duration of the ?COVID-19 declaration under Section 564(b)(1) of the Act, 21 ?U.S.C.section 360bbb-3(b)(1), unless the authorization is terminated  ?or revoked sooner.  ? ? ?  ? Influenza  A by PCR NEGATIVE NEGATIVE Final  ? Influenza B by PCR NEGATIVE NEGATIVE Final  ?  Comment: (NOTE) ?The Xpert Xpress SARS-CoV-2/FLU/RSV plus assay is intended as an aid ?in the diagnosis of influenza from Nasopharyngeal swab specimens and ?should not be used as a sole basis for treatment. Nasal washings and ?aspirates are unacceptable for Xpert Xpress SARS-CoV-2/FLU/RSV ?testing. ? ?Fact Sheet for Patients: ?EntrepreneurPulse.com.au ? ?Fact Sheet for Healthcare Providers: ?IncredibleEmployment.be ? ?This test is not yet approved or cleared by the [place] FDA and ?has been authorized for detection and/or diagnosis of SARS-CoV-2 by ?FDA under an Emergency Use Authorization (EUA). This EUA will remain ?in effect (meaning this test can be used) for  the duration of the ?COVID-19 declaration under Section 564(b)(1) of the Act, 21 U.S.C. ?section 360bbb-3(b)(1), unless the authorization is terminated or ?revoked. ? ?Performed at [facility], [address]

## 2021-05-23 ENCOUNTER — Inpatient Hospital Stay (HOSPITAL_COMMUNITY): Payer: Medicare Other

## 2021-05-23 DIAGNOSIS — I5023 Acute on chronic systolic (congestive) heart failure: Secondary | ICD-10-CM | POA: Diagnosis not present

## 2021-05-23 DIAGNOSIS — I350 Nonrheumatic aortic (valve) stenosis: Secondary | ICD-10-CM | POA: Diagnosis not present

## 2021-05-23 DIAGNOSIS — J9601 Acute respiratory failure with hypoxia: Secondary | ICD-10-CM | POA: Diagnosis not present

## 2021-05-23 LAB — BASIC METABOLIC PANEL
Anion gap: 8 (ref 5–15)
BUN: 58 mg/dL — ABNORMAL HIGH (ref 8–23)
CO2: 22 mmol/L (ref 22–32)
Calcium: 9.1 mg/dL (ref 8.9–10.3)
Chloride: 104 mmol/L (ref 98–111)
Creatinine, Ser: 2.13 mg/dL — ABNORMAL HIGH (ref 0.61–1.24)
GFR, Estimated: 28 mL/min — ABNORMAL LOW (ref >60)
Glucose, Bld: 163 mg/dL — ABNORMAL HIGH (ref 70–99)
Potassium: 5.2 mmol/L — ABNORMAL HIGH (ref 3.5–5.1)
Sodium: 134 mmol/L — ABNORMAL LOW (ref 135–145)

## 2021-05-23 LAB — CBC
HCT: 31 % — ABNORMAL LOW (ref 39.0–52.0)
Hemoglobin: 10.4 g/dL — ABNORMAL LOW (ref 13.0–17.0)
MCH: 32.2 pg (ref 26.0–34.0)
MCHC: 33.5 g/dL (ref 30.0–36.0)
MCV: 96 fL (ref 80.0–100.0)
Platelets: 111 10*3/uL — ABNORMAL LOW (ref 150–400)
RBC: 3.23 MIL/uL — ABNORMAL LOW (ref 4.22–5.81)
RDW: 14.6 % (ref 11.5–15.5)
WBC: 5.6 10*3/uL (ref 4.0–10.5)
nRBC: 0 % (ref 0.0–0.2)

## 2021-05-23 LAB — D-DIMER, QUANTITATIVE: D-Dimer, Quant: 1.44 ug/mL-FEU — ABNORMAL HIGH (ref 0.00–0.50)

## 2021-05-23 MED ORDER — FUROSEMIDE 10 MG/ML IJ SOLN
20.0000 mg | Freq: Every day | INTRAMUSCULAR | Status: DC
  Administered 2021-05-23 – 2021-05-24 (×2): 20 mg via INTRAVENOUS
  Filled 2021-05-23 (×2): qty 2

## 2021-05-23 MED ORDER — SODIUM ZIRCONIUM CYCLOSILICATE 5 G PO PACK
5.0000 g | PACK | Freq: Once | ORAL | Status: AC
  Administered 2021-05-23: 5 g via ORAL
  Filled 2021-05-23: qty 1

## 2021-05-23 NOTE — Progress Notes (Signed)
?PROGRESS NOTE ? ? ? ?Charles Lester  ZCH:[id number] DOB: [birth year] DOA: 05/20/2021 ?PCP: [name], MD ? ? ?Brief Narrative:  ?Charles Lester is a 88 y.o. male with medical history significant of coronary artery disease with cardiomyopathy and heart failure with reduced EF of 30 to 35% on echo on 2/23 and moderate aortic stenosis, peripheral artery disease, type 2 diabetes that is well controlled with diet.   ? ?Patient presents with 1 week of worsening shortness of breath sputum production orthopnea and dyspnea with exertion.  Noted to be markedly hypoxic in the 70s at intake requiring nonrebreather and subsequently BiPAP for respiratory support.  Patient admitted for close monitoring, aggressive diuresis and respiratory support in the setting of heart failure exacerbation. ? ?Assessment & Plan: ?  ?Principal Problem: ?  Acute respiratory failure with hypoxia ([place]) ?Active Problems: ?  DMII (diabetes mellitus, type 2) ([place]) ?  HTN (hypertension) ?  CORONARY ATHEROSCLEROSIS NATIVE CORONARY ARTERY ?  Diastolic congestive heart failure ([place]) ?  Acute clinical systolic heart failure ([place]) ?  Sacral decubitus ulcer, stage II ([place]) ? ?Acute hypoxic respiratory failure in the setting of combined systolic/diastolic heart failure exacerbation, POA ?-Patient with worsening respiratory status, worsening hypervolemia overnight, repeat chest x-ray shows worsening bibasilar edema ?-Cardiology consulted, appreciate insight recommendations, continue aggressive diuresis per their expertise ?-Blood pressure remains borderline low but holding steady ?-Continue to wean oxygen as tolerated ? ?D-dimer elevated ?-Concern for underlying pulmonary embolism however patient had recent CTA within the last 2 months with negative findings, cannot repeat CTA now given elevated creatinine as below ?-Discussed possibly treating patient for presumed PE however he does have some fall risk given his age.  We will hold off on anticoagulation and  follow diuresis as above, discussed with family that should patient's respiratory symptoms continue despite appropriate diuresis and volume control his symptoms may be related to underlying PE. ? ?AKI without history of CKD ?-In the setting of above diuresis, ongoing ? ?Essential hypertension  ?-borderline hypotensive with minimally elevated heart rate transition from metoprolol to diltiazem ?-Cardiology following, appreciate insight and recommendations ? ?History of CAD, stable -continue home medications including aspirin, statin, Plavix ?Non-insulin-dependent diabetes mellitus type 2  -diet controlled, sliding scale insulin hypoglycemic protocol while admitted ?Sacral decubitus ulcer stage II, chronic, POA -continue to follow clinically ?Aortic stenosis -continue to monitor blood pressure control as above ?Peripheral vascular disease -aspirin Plavix statin ongoing as above ? ?DVT prophylaxis: Lovenox ?Code Status: DNR ?Family Communication: At bedside  ? ?Status is: Inpatient ? ?Dispo: The patient is from: Home ?             Anticipated d/c is to: To be determined ?             Anticipated d/c date is: 48 to 72 hours ?             Patient currently not medically stable for discharge ? ?Consultants:  ?None ? ?Procedures:  ?None ? ?Antimicrobials:  ?None indicated ? ?Subjective: ?Worsening respiratory status early this morning requesting the BiPAP be replaced for respiratory support despite no reported hypoxia.  Patient otherwise denies chest pain nausea vomiting diarrhea constipation headache fevers or chills ? ? ?Vitals:  ? 05/23/21 0000 05/23/21 0400 05/23/21 0458 05/23/21 0548  ?BP: 94/68   (!) 117/54  ?Pulse: 96     ?Resp: (!) 21     ?Temp: 97.6 ?F (36.4 ?C) (!) 97.5 ?F (36.4 ?C)    ?TempSrc: Oral Oral    ?  SpO2: 99%     ?Weight:   62.2 kg   ?Height:      ? ?No intake or output data in the 24 hours ending 05/23/21 0709 ? ?Filed Weights  ? 05/21/21 0500 05/22/21 0457 05/23/21 0458  ?Weight: 64.2 kg 64.1 kg 62.2  kg  ? ? ?Examination: ? ?General:  Pleasantly resting in bed, No acute distress. ?HEENT:  Normocephalic atraumatic.  Sclerae nonicteric, noninjected.  Extraocular movements intact bilaterally. ?Neck:  Without mass or deformity.  Trachea is midline. ?Lungs: Bibasilar rales ?Heart:  Regular rate and rhythm.  Without murmurs, rubs, or gallops. ?Abdomen:  Soft, nontender, nondistended.  Without guarding or rebound. ?Extremities: 2+ pitting edema bilateral lower extremities ?Vascular:  Dorsalis pedis and posterior tibial pulses palpable bilaterally. ?Skin:  Warm and dry, no erythema, stage II sacral decubitus ulcer as above. ? ?Data Reviewed: I have personally reviewed following labs and imaging studies ? ?CBC: ?Recent Labs  ?Lab 05/20/21 ?1507 05/21/21 ?0440 05/22/21 ?5621 05/23/21 ?3086  ?WBC 5.3 5.0 6.2 5.6  ?HGB 11.4* 10.9* 10.2* 10.4*  ?HCT 32.7* 31.5* 30.1* 31.0*  ?MCV 92.6 95.2 94.7 96.0  ?PLT 130* 123* 112* 111*  ? ? ?Basic Metabolic Panel: ?Recent Labs  ?Lab 05/20/21 ?1507 05/21/21 ?0440 05/21/21 ?1044 05/22/21 ?0610 05/23/21 ?0352  ?NA 132* 136  --  134* 134*  ?K 4.3 5.7* 5.2* 4.9 5.2*  ?CL 102 107  --  105 104  ?CO2 21* 21*  --  22 22  ?GLUCOSE 246* 165*  --  155* 163*  ?BUN 47* 47*  --  52* 58*  ?CREATININE 1.95* 1.84*  --  1.93* 2.13*  ?CALCIUM 9.2 9.3  --  9.1 9.1  ?MG 2.0 2.4  --   --   --   ? ? ?GFR: ?Estimated Creatinine Clearance: 18.7 mL/min (A) (by C-G formula based on SCr of 2.13 mg/dL (H)). ?Liver Function Tests: ?No results for input(s): AST, ALT, ALKPHOS, BILITOT, PROT, ALBUMIN in the last 168 hours. ?No results for input(s): LIPASE, AMYLASE in the last 168 hours. ?No results for input(s): AMMONIA in the last 168 hours. ?Coagulation Profile: ?Recent Labs  ?Lab 05/20/21 ?1507  ?INR 1.2  ? ? ?Cardiac Enzymes: ?No results for input(s): CKTOTAL, CKMB, CKMBINDEX, TROPONINI in the last 168 hours. ?BNP (last 3 results) ?No results for input(s): PROBNP in the last 8760 hours. ?HbA1C: ?No results for  input(s): HGBA1C in the last 72 hours. ?CBG: ?No results for input(s): GLUCAP in the last 168 hours. ?Lipid Profile: ?No results for input(s): CHOL, HDL, LDLCALC, TRIG, CHOLHDL, LDLDIRECT in the last 72 hours. ?Thyroid Function Tests: ?No results for input(s): TSH, T4TOTAL, FREET4, T3FREE, THYROIDAB in the last 72 hours. ?Anemia Panel: ?No results for input(s): VITAMINB12, FOLATE, FERRITIN, TIBC, IRON, RETICCTPCT in the last 72 hours. ?Sepsis Labs: ?No results for input(s): PROCALCITON, LATICACIDVEN in the last 168 hours. ? ?Recent Results (from the past 240 hour(s))  ?Resp Panel by RT-PCR (Flu A&B, Covid) Nasopharyngeal Swab     Status: None  ? Collection Time: 05/20/21  3:23 PM  ? Specimen: Nasopharyngeal Swab; Nasopharyngeal(NP) swabs in vial transport medium  ?Result Value Ref Range Status  ? SARS Coronavirus 2 by RT PCR NEGATIVE NEGATIVE Final  ?  Comment: (NOTE) ?SARS-CoV-2 target nucleic acids are NOT DETECTED. ? ?The SARS-CoV-2 RNA is generally detectable in upper respiratory ?specimens during the acute phase of infection. The lowest ?concentration of SARS-CoV-2 viral copies this assay can detect is ?138 copies/mL. A negative result does  not preclude SARS-Cov-2 ?infection and should not be used as the sole basis for treatment or ?other patient management decisions. A negative result may occur with  ?improper specimen collection/handling, submission of specimen other ?than nasopharyngeal swab, presence of viral mutation(s) within the ?areas targeted by this assay, and inadequate number of viral ?copies(<138 copies/mL). A negative result must be combined with ?clinical observations, patient history, and epidemiological ?information. The expected result is Negative. ? ?Fact Sheet for Patients:  ?EntrepreneurPulse.com.au ? ?Fact Sheet for Healthcare Providers:  ?IncredibleEmployment.be ? ?This test is no t yet approved or cleared by the [place] FDA and  ?has been  authorized for detection and/or diagnosis of SARS-CoV-2 by ?FDA under an Emergency Use Authorization (EUA). This EUA will remain  ?in effect (meaning this test can be used) for the duration of the ?COVID-19 declaration

## 2021-05-23 NOTE — Consult Note (Addendum)
?Cardiology Consultation:  ? ?Patient ID: Charles Lester ?MRN: [id number]; DOB: 11-Apr-1924 ? ?Admit date: 05/20/2021 ?Date of Consult: 05/23/2021 ? ?PCP:  [name], MD ?  ?[facility] Providers ?Cardiologist:  Rozann Lesches, MD      ? ? ?Patient Profile:  ? ?Charles Lester is a 88 y.o. male with a hx of presumed CAD, CHG who is being seen 05/23/2021 for the evaluation of CHF at the request of Dr. Avon Gully. ? ?History of Present Illness:  ? ?Charles Lester  with past medical history of aortic stenosis, CAD (based off prior noninvasive testing and medical management pursued in the setting of his advanced age), carotid artery stenosis, HTN and HLD  ? ?He was  admitted to [facility] in 03/2021 for an acute CHF exacerbation and repeat echocardiogram showed his EF was reduced at 30 to 35% which was new when compared to prior imaging. His aortic stenosis was in a moderate range with low gradient and was noted to have moderate to severe AI. Options were reviewed with the patient and he wished to pursue medical therapy only without invasive testing and this was felt to be reasonable given his age.  Lopressor was transitioned to Toprol-XL 50 mg daily and he was started on Imdur 15 mg daily and Hydralazine 10 mg 3 times daily given his AKI during admission. He was continued on Lasix 20 mg daily. ?  ?Patient now comes in with 1 week history of worsening shortness of breath, orthopnea, hypoxic in the 70's requiring BiPAP. Legs have been swelling and negative for DVT 05/03/21. Given IV bolus 250 cc NS yest when BP dropped after IV lasix and now on oral 20 mg. Imdur, toprol, lisinopril all on hold. Denies extra salt. Not eating much. Weights usually 140.  ? ?Past Medical History:  ?Diagnosis Date  ? Aortic stenosis   ? a. Mild to moderate 2017 b. echo in 03/2021 showing moderate AS and moderate to severe AI  ? Arthritis   ? CAD (coronary artery disease)   ? Cardiomyopathy ([place])   ? a. EF 30-35% by echo in 03/2020. Patient declined  further work-up in the setting of his advanced age.  ? Essential hypertension   ? Hyperlipidemia   ? Ischemic heart disease   ? Based on Myoview 2010 revealing inferolateral scar but no active ischemia  ? PAD (peripheral artery disease) ([place])   ? Type 2 diabetes mellitus ([place])   ? ? ?Past Surgical History:  ?Procedure Laterality Date  ? NO PAST SURGERIES    ?  ? ?Home Medications:  ?Prior to Admission medications   ?Medication Sig Start Date End Date Taking? Authorizing Provider  ?amLODipine (NORVASC) 2.5 MG tablet Take 2.5 mg by mouth daily. 05/02/21  Yes [provider]  ?aspirin EC 81 MG tablet Take 1 tablet (81 mg total) by mouth daily. 10/22/20  Yes Verta Ellen., NP  ?atorvastatin (LIPITOR) 20 MG tablet Take 1 tablet (20 mg total) by mouth daily. 10/22/20  Yes Verta Ellen., NP  ?Cholecalciferol (VITAMIN D3) 10 MCG (400 UNIT) CAPS Take 400 Units by mouth daily.   Yes [provider]  ?clopidogrel (PLAVIX) 75 MG tablet Take 1 tablet (75 mg total) by mouth daily. 10/22/20  Yes Verta Ellen., NP  ?ferrous sulfate 325 (65 FE) MG tablet Take 325 mg by mouth daily with breakfast.   Yes [provider]  ?finasteride (PROSCAR) 5 MG tablet Take 5 mg by mouth daily. 06/11/12  Yes [provider]  ?furosemide (LASIX) 20 MG tablet Take 1 tablet (20 mg total) by mouth daily. 03/08/21  Yes Shahmehdi, Valeria Batman, MD  ?hydrALAZINE (APRESOLINE) 10 MG tablet Take 1 tablet (10 mg total) by mouth 2 (two) times daily. 03/07/21 05/20/21 Yes Shahmehdi, Valeria Batman, MD  ?isosorbide mononitrate (IMDUR) 30 MG 24 hr tablet Take 0.5 tablets (15 mg total) by mouth daily. 05/02/21 06/01/21 Yes Strader, Fransisco Hertz, PA-C  ?lisinopril (ZESTRIL) 40 MG tablet Take 40 mg by mouth daily. 05/02/21  Yes [provider]  ?metoprolol succinate (TOPROL-XL) 50 MG 24 hr tablet Take 1 tablet (50 mg total) by mouth daily. Take with or immediately following a meal. 05/02/21 06/01/21 Yes Strader, Fransisco Hertz, PA-C  ?niacin  (NIASPAN) 1000 MG CR tablet Take 1,000 mg by mouth daily. 03/27/19  Yes [provider]  ?Omega-3 Fatty Acids (FISH OIL) 1000 MG CAPS Take 1,000 mg by mouth daily.   Yes [provider]  ?tamsulosin (FLOMAX) 0.4 MG CAPS Take 0.4 mg by mouth in the morning and at bedtime. 06/01/12  Yes [provider]  ?vitamin C (ASCORBIC ACID) 500 MG tablet Take 500 mg by mouth daily.   Yes [provider]  ? ? ?Inpatient Medications: ?Scheduled Meds: ? aspirin EC  81 mg Oral Daily  ? atorvastatin  20 mg Oral Daily  ? Chlorhexidine Gluconate Cloth  6 each Topical Daily  ? clopidogrel  75 mg Oral Daily  ? diltiazem  30 mg Oral Q8H  ? enoxaparin (LOVENOX) injection  30 mg Subcutaneous Q24H  ? finasteride  5 mg Oral Daily  ? furosemide  20 mg Oral Daily  ? mouth rinse  15 mL Mouth Rinse BID  ? tamsulosin  0.4 mg Oral QPC supper  ? ?Continuous Infusions: ? ?PRN Meds: ?guaiFENesin-dextromethorphan, ipratropium-albuterol, ondansetron **OR** ondansetron (ZOFRAN) IV ? ?Allergies:   No Known Allergies ? ?Social History:   ?Social History  ? ?Socioeconomic History  ? Marital status: Married  ?  Spouse name: Not on file  ? Number of children: Not on file  ? Years of education: Not on file  ? Highest education level: Not on file  ?Occupational History  ? Occupation: Retired  ?Tobacco Use  ? Smoking status: Former  ?  Types: Cigarettes  ?  Quit date: 02/10/2005  ?  Years since quitting: 16.2  ? Smokeless tobacco: Never  ?Vaping Use  ? Vaping Use: Never used  ?Substance and Sexual Activity  ? Alcohol use: No  ? Drug use: No  ? Sexual activity: Not Currently  ?  Partners: Female  ?Other Topics Concern  ? Not on file  ?Social History Narrative  ? No regular exercise  ? ?Social Determinants of Health  ? ?Financial Resource Strain: Not on file  ?Food Insecurity: Not on file  ?Transportation Needs: Not on file  ?Physical Activity: Not on file  ?Stress: Not on file  ?Social Connections: Not on file  ?Intimate Partner  Violence: Not on file  ?  ?Family History:   ?  ?Family History  ?Problem Relation Age of Onset  ? Hypertension Father   ? Hypertension Sister   ? Hypertension Brother   ? Coronary artery disease Neg Hx   ?  ? ?ROS:  ?Please see the history of present illness.  ?Review of Systems  ?Constitutional: Negative.  ?HENT: Negative.    ?Cardiovascular:  Positive for dyspnea on exertion, leg swelling and orthopnea.  ?Respiratory:  Positive for shortness of breath.   ?  Endocrine: Negative.   ?Hematologic/Lymphatic: Negative.   ?Musculoskeletal: Negative.   ?Gastrointestinal: Negative.   ?Genitourinary: Negative.   ?Neurological: Negative.    ?All other ROS reviewed and negative.    ? ?Physical Exam/Data:  ? ?Vitals:  ? 05/23/21 0548 05/23/21 0700 05/23/21 0800 05/23/21 0831  ?BP: (!) 117/54 (!) 112/51 (!) 106/54   ?Pulse:    (!) 117  ?Resp:  (!) 21 (!) 21 (!) 24  ?Temp:  97.6 ?F (36.4 ?C)    ?TempSrc:  Axillary    ?SpO2:   95% 99%  ?Weight:      ?Height:      ? ?No intake or output data in the 24 hours ending 05/23/21 0859 ? ?  05/23/2021  ?  4:58 AM 05/22/2021  ?  4:57 AM 05/21/2021  ?  5:00 AM  ?Last 3 Weights  ?Weight (lbs) 137 lb 2 oz 141 lb 5 oz 141 lb 8.6 oz  ?Weight (kg) 62.2 kg 64.1 kg 64.2 kg  ?   ?Body mass index is 21.48 kg/m?.  ?General:  Thin, elderly, in no acute distress  ?HEENT: normal ?Neck: no JVD ?Vascular: No carotid bruits; Distal pulses 2+ bilaterally ?Cardiac:  normal S1, S2; OHY;0/7 systolic and diastolic murmur ?Lungs: bibasilar rales  ?Abd: soft, nontender, no hepatomegaly  ?Ext: trace edema R>L ?Musculoskeletal:  No deformities, BUE and BLE strength normal and equal ?Skin: warm and dry  ?Neuro:  CNs 2-12 intact, no focal abnormalities noted ?Psych:  Normal affect  ? ?EKG:  The EKG was personally reviewed and demonstrates:  NSR with LVH, poor ant R wave progression, PAC's ?Telemetry:  Telemetry was personally reviewed and demonstrates:  NSR with PAC's ? ?Relevant CV Studies: ?  ?Echocardiogram:  03/2021 ?IMPRESSIONS  ? ? ? 1. Left ventricular ejection fraction, by estimation, is 30 to 35%. The  ?left ventricle has moderately decreased function. The left ventricle  ?demonstrates regional wall motion abn

## 2021-05-23 NOTE — Progress Notes (Signed)
Patient requesting BIPAP machine and having shortness of breath. RN made RT aware that he had placed patient on BIPAP. RT went to room to perform a BIPAP check. ?

## 2021-05-23 NOTE — TOC Initial Note (Signed)
Transition of Care (TOC) - Initial/Assessment Note  ? ? ?Patient Details  ?Name: Charles Lester ?MRN: [id number] ?Date of Birth: 1924/03/07 ? ?Transition of Care (TOC) CM/SW Contact:    ?Iona Beard, LCSWA ?Phone Number: ?05/23/2021, 12:00 PM ? ?Clinical Narrative:                 ?TOC consulted for CHF screen. CSW met with pt and family in room to complete assessment. Pt lives with his wife. Pt has been independent in completing her ADLs. Pt has been able to drive. Pt is active with HH PT and RN services through [facility]. Pt has a cane and walker to use when needed in the home. TOC to follow.   ? ?Expected Discharge Plan: [place] ?Barriers to Discharge: Continued Medical Work up ? ? ?Patient Goals and CMS Choice ?Patient states their goals for this hospitalization and ongoing recovery are:: Home with HH ?CMS Medicare.gov Compare Post Acute Care list provided to:: Patient ?Choice offered to / list presented to : Patient ? ?Expected Discharge Plan and Services ?Expected Discharge Plan: [place] ?In-house Referral: Clinical Social Work ?  ?Post Acute Care Choice: Home Health ?Living arrangements for the past 2 months: [place] ?                ?  ?  ?  ?  ?  ?HH Arranged: PT, RN ?[place] Agency: [place] ?Date HH Agency Contacted: 05/23/21 ?  ?Representative spoke with at [facility]: Marjory Lies ? ?Prior Living Arrangements/Services ?Living arrangements for the past 2 months: [place] ?Lives with:: Spouse ?Patient language and need for interpreter reviewed:: Yes ?Do you feel safe going back to the place where you live?: Yes      ?Need for Family Participation in Patient Care: Yes (Comment) ?Care giver support system in place?: Yes (comment) ?Current home services: DME ?Criminal Activity/Legal Involvement Pertinent to Current Situation/Hospitalization: No - Comment as needed ? ?Activities of Daily Living ?Home Assistive Devices/Equipment: Gilford Rile (specify  type), Cane (specify quad or straight) (Front wheel walker) ?ADL Screening (condition at time of admission) ?Patient's cognitive ability adequate to safely complete daily activities?: Yes ?Is the patient deaf or have difficulty hearing?: No ?Does the patient have difficulty seeing, even when wearing glasses/contacts?: No ?Does the patient have difficulty concentrating, remembering, or making decisions?: No ?Patient able to express need for assistance with ADLs?: Yes ?Does the patient have difficulty dressing or bathing?: No ?Independently performs ADLs?: Yes (appropriate for developmental age) ?Does the patient have difficulty walking or climbing stairs?: Yes ?Weakness of Legs: Both ?Weakness of Arms/Hands: Both ? ?Permission Sought/Granted ?  ?  ?   ?   ?   ?   ? ?Emotional Assessment ?Appearance:: Appears stated age ?Attitude/Demeanor/Rapport: Engaged ?Affect (typically observed): Accepting ?Orientation: : Oriented to Self, Oriented to Place, Oriented to  Time, Oriented to Situation ?Alcohol / Substance Use: Not Applicable ?Psych Involvement: No (comment) ? ?Admission diagnosis:  Acute respiratory failure with hypoxia ([place]) [J96.01] ?Patient Active Problem List  ? Diagnosis Date Noted  ? Acute respiratory failure with hypoxia ([place]) 05/20/2021  ? Acute clinical systolic heart failure ([place]) 05/20/2021  ? Sacral decubitus ulcer, stage II ([place]) 05/20/2021  ? Chest pain 03/07/2021  ? DMII (diabetes mellitus, type 2) ([place]) 03/06/2021  ?  Class: Chronic  ? HTN (hypertension) 03/06/2021  ?  Class: Chronic  ? Diastolic congestive heart failure ([place]) 03/06/2021  ? Elevated  PSA 03/25/2019  ? Atherosclerosis of native arteries of the extremities with intermittent claudication 06/19/2012  ? Peripheral vascular disease ([place]) 03/08/2011  ? MIXED HYPERLIPIDEMIA 03/06/2008  ? CORONARY ATHEROSCLEROSIS NATIVE CORONARY ARTERY 03/06/2008  ? Carotid artery stenosis 03/06/2008  ? ?PCP:  [name], MD ?Pharmacy:   ?[facility] [place], [address] ?[address] ?[place] [address] ?Phone: [phone] Fax: [phone] ? ? ? ? ?Social Determinants of Health (SDOH) Interventions ?  ? ?Readmission Risk Interventions ? ?  05/23/2021  ? 11:58 AM  ?Readmission Risk Prevention Plan  ?Transportation Screening Complete  ?Home Care Screening Complete  ?Medication Review (RN CM) Complete  ? ? ? ?

## 2021-05-23 NOTE — Progress Notes (Signed)
Pt requests to be put back on BiPAP due to SOB and increased WOB, although O2 sat's are WNL. Placed patient back on bipap at 40% fiO2 per request. Will make MD aware.  ?

## 2021-05-24 DIAGNOSIS — I350 Nonrheumatic aortic (valve) stenosis: Secondary | ICD-10-CM | POA: Diagnosis not present

## 2021-05-24 DIAGNOSIS — J9601 Acute respiratory failure with hypoxia: Secondary | ICD-10-CM | POA: Diagnosis not present

## 2021-05-24 LAB — CBC
HCT: 30.1 % — ABNORMAL LOW (ref 39.0–52.0)
HCT: 31.6 % — ABNORMAL LOW (ref 39.0–52.0)
Hemoglobin: 10.2 g/dL — ABNORMAL LOW (ref 13.0–17.0)
Hemoglobin: 10.2 g/dL — ABNORMAL LOW (ref 13.0–17.0)
MCH: 32.1 pg (ref 26.0–34.0)
MCH: 31.4 pg (ref 26.0–34.0)
MCHC: 33.9 g/dL (ref 30.0–36.0)
MCHC: 32.3 g/dL (ref 30.0–36.0)
MCV: 94.7 fL (ref 80.0–100.0)
MCV: 97.2 fL (ref 80.0–100.0)
Platelets: 112 10*3/uL — ABNORMAL LOW (ref 150–400)
Platelets: 120 10*3/uL — ABNORMAL LOW (ref 150–400)
RBC: 3.18 MIL/uL — ABNORMAL LOW (ref 4.22–5.81)
RBC: 3.25 MIL/uL — ABNORMAL LOW (ref 4.22–5.81)
RDW: 14.5 % (ref 11.5–15.5)
RDW: 15 % (ref 11.5–15.5)
WBC: 6.2 10*3/uL (ref 4.0–10.5)
WBC: 5.2 10*3/uL (ref 4.0–10.5)
nRBC: 0 % (ref 0.0–0.2)
nRBC: 0 % (ref 0.0–0.2)

## 2021-05-24 LAB — BASIC METABOLIC PANEL
Anion gap: 8 (ref 5–15)
BUN: 68 mg/dL — ABNORMAL HIGH (ref 8–23)
CO2: 22 mmol/L (ref 22–32)
Calcium: 9.3 mg/dL (ref 8.9–10.3)
Chloride: 104 mmol/L (ref 98–111)
Creatinine, Ser: 2.27 mg/dL — ABNORMAL HIGH (ref 0.61–1.24)
GFR, Estimated: 26 mL/min — ABNORMAL LOW (ref >60)
Glucose, Bld: 190 mg/dL — ABNORMAL HIGH (ref 70–99)
Potassium: 5.1 mmol/L (ref 3.5–5.1)
Sodium: 134 mmol/L — ABNORMAL LOW (ref 135–145)

## 2021-05-24 MED ORDER — FUROSEMIDE 10 MG/ML IJ SOLN
20.0000 mg | Freq: Two times a day (BID) | INTRAMUSCULAR | Status: DC
  Administered 2021-05-24 – 2021-05-25 (×2): 20 mg via INTRAVENOUS
  Filled 2021-05-24 (×2): qty 2

## 2021-05-24 MED ORDER — METOPROLOL TARTRATE 25 MG PO TABS
12.5000 mg | ORAL_TABLET | Freq: Two times a day (BID) | ORAL | Status: DC
  Administered 2021-05-24 – 2021-05-28 (×4): 12.5 mg via ORAL
  Filled 2021-05-24 (×5): qty 1

## 2021-05-24 NOTE — Progress Notes (Signed)
?PROGRESS NOTE ? ? ? ?[name]  BSW:[id number] DOB: 1924/02/17 DOA: 05/20/2021 ?PCP: [name], MD ? ? ?Brief Narrative:  ?Charles Lester is a 88 y.o. male with medical history significant of coronary artery disease with cardiomyopathy and heart failure with reduced EF of 30 to 35% on echo on 2/23 and moderate aortic stenosis, peripheral artery disease, type 2 diabetes that is well controlled with diet.   ? ?Patient presents with 1 week of worsening shortness of breath sputum production orthopnea and dyspnea with exertion.  Noted to be markedly hypoxic in the 70s at intake requiring nonrebreather and subsequently BiPAP for respiratory support.  Patient admitted for close monitoring, aggressive diuresis and respiratory support in the setting of heart failure exacerbation. ? ?Assessment & Plan: ?  ?Principal Problem: ?  Acute respiratory failure with hypoxia ([place]) ?Active Problems: ?  DMII (diabetes mellitus, type 2) ([place]) ?  HTN (hypertension) ?  CORONARY ATHEROSCLEROSIS NATIVE CORONARY ARTERY ?  Diastolic congestive heart failure ([place]) ?  Acute clinical systolic heart failure ([place]) ?  Sacral decubitus ulcer, stage II ([place]) ? ?Acute hypoxic respiratory failure in the setting of combined systolic/diastolic heart failure exacerbation, POA ?-Patient with worsening respiratory status, worsening hypervolemia overnight, repeat chest x-ray shows worsening bibasilar edema ?-Cardiology consulted, appreciate insight recommendations, continue aggressive diuresis per their expertise ?-Blood pressure remains borderline low but holding steady ?-Continue to wean oxygen as tolerated ? ?D-dimer elevated ?-Concern for underlying pulmonary embolism however patient had recent CTA within the last 2 months with negative findings, cannot repeat CTA now given elevated creatinine as below; VQ also limited value at this time given bilateral effusions ?-Discussed possibly treating patient for presumed PE however he does have some fall  risk given his age.  We will hold off on anticoagulation and follow diuresis as above, discussed with family that should patient's respiratory symptoms continue despite appropriate diuresis and volume control his symptoms may be related to underlying PE. ? ?AKI without history of CKD ?-In the setting of above diuresis, ongoing ? ?Essential hypertension  ?-borderline hypotensive with minimally elevated heart rate transition from metoprolol to diltiazem ?-Cardiology following, appreciate insight and recommendations ? ?History of CAD, stable -continue home medications including aspirin, statin, Plavix ?Non-insulin-dependent diabetes mellitus type 2  -diet controlled, sliding scale insulin hypoglycemic protocol while admitted ?Sacral decubitus ulcer stage II, chronic, POA -continue to follow clinically ?Aortic stenosis -continue to monitor blood pressure control as above ?Peripheral vascular disease -aspirin Plavix statin ongoing as above ? ?DVT prophylaxis: Lovenox ?Code Status: DNR ?Family Communication: At bedside  ? ?Status is: Inpatient ? ?Dispo: The patient is from: Home ?             Anticipated d/c is to: To be determined ?             Anticipated d/c date is: 48 to 72 hours ?             Patient currently not medically stable for discharge ? ?Consultants:  ?None ? ?Procedures:  ?None ? ?Antimicrobials:  ?None indicated ? ?Subjective: ?Worsening respiratory status early this morning requesting the BiPAP be replaced for respiratory support despite no reported hypoxia.  Patient otherwise denies chest pain nausea vomiting diarrhea constipation headache fevers or chills ? ? ?Vitals:  ? 05/24/21 0500 05/24/21 0600 05/24/21 0700 05/24/21 0721  ?BP: (!) 101/47 119/77 120/80   ?Pulse: (!) 25 80 85 78  ?Resp: 12 (!) 22 17 (!) 24  ?Temp:      ?  TempSrc:      ?SpO2: 100% 100% 96% 100%  ?Weight:      ?Height:      ? ? ?Intake/Output Summary (Last 24 hours) at 05/24/2021 0737 ?Last data filed at 05/23/2021 1000 ?Gross per 24  hour  ?Intake 150 ml  ?Output --  ?Net 150 ml  ? ? ?Filed Weights  ? 05/22/21 0457 05/23/21 0458 05/24/21 0421  ?Weight: 64.1 kg 62.2 kg 63.8 kg  ? ? ?Examination: ? ?General:  Pleasantly resting in bed, No acute distress. ?HEENT:  Normocephalic atraumatic.  Sclerae nonicteric, noninjected.  Extraocular movements intact bilaterally. ?Neck:  Without mass or deformity.  Trachea is midline. ?Lungs: Bibasilar rales ?Heart:  Regular rate and rhythm.  Without murmurs, rubs, or gallops. ?Abdomen:  Soft, nontender, nondistended.  Without guarding or rebound. ?Extremities: 2+ pitting edema bilateral lower extremities ?Vascular:  Dorsalis pedis and posterior tibial pulses palpable bilaterally. ?Skin:  Warm and dry, no erythema, stage II sacral decubitus ulcer as above. ? ?Data Reviewed: I have personally reviewed following labs and imaging studies ? ?CBC: ?Recent Labs  ?Lab 05/20/21 ?1507 05/21/21 ?0440 05/22/21 ?2426 05/23/21 ?8341 05/24/21 ?0415  ?WBC 5.3 5.0 6.2 5.6 5.2  ?HGB 11.4* 10.9* 10.2* 10.4* 10.2*  ?HCT 32.7* 31.5* 30.1* 31.0* 31.6*  ?MCV 92.6 95.2 94.7 96.0 97.2  ?PLT 130* 123* 112* 111* 120*  ? ? ?Basic Metabolic Panel: ?Recent Labs  ?Lab 05/20/21 ?1507 05/21/21 ?0440 05/21/21 ?1044 05/22/21 ?0610 05/23/21 ?0352 05/24/21 ?0415  ?NA 132* 136  --  134* 134* 134*  ?K 4.3 5.7* 5.2* 4.9 5.2* 5.1  ?CL 102 107  --  105 104 104  ?CO2 21* 21*  --  '22 22 22  '$ ?GLUCOSE 246* 165*  --  155* 163* 190*  ?BUN 47* 47*  --  52* 58* 68*  ?CREATININE 1.95* 1.84*  --  1.93* 2.13* 2.27*  ?CALCIUM 9.2 9.3  --  9.1 9.1 9.3  ?MG 2.0 2.4  --   --   --   --   ? ? ?GFR: ?Estimated Creatinine Clearance: 18 mL/min (A) (by C-G formula based on SCr of 2.27 mg/dL (H)). ?Liver Function Tests: ?No results for input(s): AST, ALT, ALKPHOS, BILITOT, PROT, ALBUMIN in the last 168 hours. ?No results for input(s): LIPASE, AMYLASE in the last 168 hours. ?No results for input(s): AMMONIA in the last 168 hours. ?Coagulation Profile: ?Recent Labs  ?Lab  05/20/21 ?1507  ?INR 1.2  ? ? ?Cardiac Enzymes: ?No results for input(s): CKTOTAL, CKMB, CKMBINDEX, TROPONINI in the last 168 hours. ?BNP (last 3 results) ?No results for input(s): PROBNP in the last 8760 hours. ?HbA1C: ?No results for input(s): HGBA1C in the last 72 hours. ?CBG: ?No results for input(s): GLUCAP in the last 168 hours. ?Lipid Profile: ?No results for input(s): CHOL, HDL, LDLCALC, TRIG, CHOLHDL, LDLDIRECT in the last 72 hours. ?Thyroid Function Tests: ?No results for input(s): TSH, T4TOTAL, FREET4, T3FREE, THYROIDAB in the last 72 hours. ?Anemia Panel: ?No results for input(s): VITAMINB12, FOLATE, FERRITIN, TIBC, IRON, RETICCTPCT in the last 72 hours. ?Sepsis Labs: ?No results for input(s): PROCALCITON, LATICACIDVEN in the last 168 hours. ? ?Recent Results (from the past 240 hour(s))  ?Resp Panel by RT-PCR (Flu A&B, Covid) Nasopharyngeal Swab     Status: None  ? Collection Time: 05/20/21  3:23 PM  ? Specimen: Nasopharyngeal Swab; Nasopharyngeal(NP) swabs in vial transport medium  ?Result Value Ref Range Status  ? SARS Coronavirus 2 by RT PCR NEGATIVE NEGATIVE Final  ?  Comment: (NOTE) ?SARS-CoV-2 target nucleic acids are NOT DETECTED. ? ?The SARS-CoV-2 RNA is generally detectable in upper respiratory ?specimens during the acute phase of infection. The lowest ?concentration of SARS-CoV-2 viral copies this assay can detect is ?138 copies/mL. A negative result does not preclude SARS-Cov-2 ?infection and should not be used as the sole basis for treatment or ?other patient management decisions. A negative result may occur with  ?improper specimen collection/handling, submission of specimen other ?than nasopharyngeal swab, presence of viral mutation(s) within the ?areas targeted by this assay, and inadequate number of viral ?copies(<138 copies/mL). A negative result must be combined with ?clinical observations, patient history, and epidemiological ?information. The expected result is Negative. ? ?Fact Sheet  for Patients:  ?EntrepreneurPulse.com.au ? ?Fact Sheet for Healthcare Providers:  ?IncredibleEmployment.be ? ?This test is no t yet approved or cleared by the [place] F

## 2021-05-24 NOTE — Progress Notes (Addendum)
? ?Progress Note ? ?Patient Name: Charles Lester ?Date of Encounter: 05/24/2021 ? ?[facility] Cardiologist: Rozann Lesches, MD  ? ?Subjective  ? ?Still short of breath at rest. Slept with the head of his bed elevated. Using BiPAP intermittently. No chest pain.  ? ?Inpatient Medications  ?  ?Scheduled Meds: ? aspirin EC  81 mg Oral Daily  ? atorvastatin  20 mg Oral Daily  ? Chlorhexidine Gluconate Cloth  6 each Topical Daily  ? clopidogrel  75 mg Oral Daily  ? enoxaparin (LOVENOX) injection  30 mg Subcutaneous Q24H  ? finasteride  5 mg Oral Daily  ? furosemide  20 mg Intravenous Daily  ? mouth rinse  15 mL Mouth Rinse BID  ? metoprolol tartrate  12.5 mg Oral BID  ? tamsulosin  0.4 mg Oral QPC supper  ? ?Continuous Infusions: ? ?PRN Meds: ?guaiFENesin-dextromethorphan, ipratropium-albuterol, ondansetron **OR** ondansetron (ZOFRAN) IV  ? ?Vital Signs  ?  ?Vitals:  ? 05/24/21 0728 05/24/21 0800 05/24/21 0842 05/24/21 0900  ?BP:  (!) 102/42  (!) 109/46  ?Pulse:  72  86  ?Resp:  17 (!) 23 (!) 21  ?Temp: (!) 97.2 ?F (36.2 ?C)     ?TempSrc: Axillary     ?SpO2:  100% 100% 100%  ?Weight:      ?Height:      ? ? ?Intake/Output Summary (Last 24 hours) at 05/24/2021 0939 ?Last data filed at 05/23/2021 1000 ?Gross per 24 hour  ?Intake 150 ml  ?Output --  ?Net 150 ml  ? ? ?  05/24/2021  ?  4:21 AM 05/23/2021  ?  4:58 AM 05/22/2021  ?  4:57 AM  ?Last 3 Weights  ?Weight (lbs) 140 lb 10.5 oz 137 lb 2 oz 141 lb 5 oz  ?Weight (kg) 63.8 kg 62.2 kg 64.1 kg  ?   ? ?Telemetry  ?  ?NSR, HR in 80's to 90's with frequent PVC's and couplets. Tachycardiac into the 130's this AM and difficult to distinguish if atrial tachycardia or 2:1 flutter given his underlying conduction delay.  - Personally Reviewed ? ?ECG  ?  ?No new tracings.  ? ?Physical Exam  ? ?GEN: Pleasant elderly male appearing in no acute distress.   ?Neck: No JVD ?Cardiac: RRR with frequent ectopic beats, 2/6 SEM and diastolic murmur along RUSB.  ?Respiratory: Rales along bases  bilaterally.  ?GI: Soft, nontender, non-distended  ?MS: trace lower extremity edema bilaterally; No deformity. ?Neuro:  Nonfocal  ?Psych: Normal affect  ? ?Labs  ?  ?High Sensitivity Troponin:  No results for input(s): TROPONINIHS in the last 720 hours.   ?Chemistry ?Recent Labs  ?Lab 05/20/21 ?1507 05/21/21 ?0440 05/21/21 ?1044 05/22/21 ?0610 05/23/21 ?0352 05/24/21 ?0415  ?NA 132* 136  --  134* 134* 134*  ?K 4.3 5.7*   < > 4.9 5.2* 5.1  ?CL 102 107  --  105 104 104  ?CO2 21* 21*  --  '22 22 22  '$ ?GLUCOSE 246* 165*  --  155* 163* 190*  ?BUN 47* 47*  --  52* 58* 68*  ?CREATININE 1.95* 1.84*  --  1.93* 2.13* 2.27*  ?CALCIUM 9.2 9.3  --  9.1 9.1 9.3  ?MG 2.0 2.4  --   --   --   --   ?GFRNONAA 31* 34*  --  32* 28* 26*  ?ANIONGAP 9 8  --  '7 8 8  '$ ? < > = values in this interval not displayed.  ?  ?Lipids No results  for input(s): CHOL, TRIG, HDL, LABVLDL, LDLCALC, CHOLHDL in the last 168 hours.  ?Hematology ?Recent Labs  ?Lab 05/22/21 ?0610 05/23/21 ?0352 05/24/21 ?0415  ?WBC 6.2 5.6 5.2  ?RBC 3.18* 3.23* 3.25*  ?HGB 10.2* 10.4* 10.2*  ?HCT 30.1* 31.0* 31.6*  ?MCV 94.7 96.0 97.2  ?MCH 32.1 32.2 31.4  ?MCHC 33.9 33.5 32.3  ?RDW 14.5 14.6 15.0  ?PLT 112* 111* 120*  ? ?Thyroid No results for input(s): TSH, FREET4 in the last 168 hours.  ?BNP ?Recent Labs  ?Lab 05/20/21 ?1307  ?BNP 1,127.0*  ?  ?DDimer  ?Recent Labs  ?Lab 05/23/21 ?0818  ?DDIMER 1.44*  ?  ? ?Radiology  ?  ?DG CHEST PORT 1 VIEW ? ?Result Date: 05/23/2021 ?CLINICAL DATA:  Shortness of breath, hypoxia EXAM: PORTABLE CHEST 1 VIEW COMPARISON:  05/20/2021 FINDINGS: Increased bilateral pleural effusions and bibasilar atelectasis. Similar probable mild pulmonary edema. No pneumothorax. Similar cardiomediastinal contours. IMPRESSION: Increased bilateral pleural effusions and bibasilar atelectasis. Similar probable mild pulmonary edema. Electronically Signed   By: Macy Mis M.D.   On: 05/23/2021 10:15   ? ?Cardiac Studies  ? ?Echo: 03/2021 ?IMPRESSIONS  ? ? ? 1. Left  ventricular ejection fraction, by estimation, is 30 to 35%. The  ?left ventricle has moderately decreased function. The left ventricle  ?demonstrates regional wall motion abnormalities (see scoring  ?diagram/findings for description). There is mild  ?left ventricular hypertrophy. Left ventricular diastolic parameters are  ?consistent with Grade I diastolic dysfunction (impaired relaxation).  ?Elevated left ventricular end-diastolic pressure.  ? 2. Right ventricular systolic function is normal. The right ventricular  ?size is normal. There is normal pulmonary artery systolic pressure. The  ?estimated right ventricular systolic pressure is 43.3 mmHg.  ? 3. Left atrial size was moderately dilated.  ? 4. The mitral valve is degenerative. Mild mitral valve regurgitation.  ? 5. The aortic valve has an indeterminant number of cusps. There is  ?moderate calcification of the aortic valve. Aortic valve regurgitation is  ?moderate to severe. Moderate aortic valve stenosis with low gradient.  ?Aortic regurgitation PHT measures 210  ?msec. Aortic valve area, by VTI measures 1.37 cm?Marland Kitchen Aortic valve mean  ?gradient measures 13.0 mmHg. Dimentionless index 0.44.  ? 6. The inferior vena cava is normal in size with greater than 50%  ?respiratory variability, suggesting right atrial pressure of 3 mmHg.  ? ?Patient Profile  ?   ?88 y.o. male w/ PMH of HFrEF (EF 30-35% in 03/2021), aortic stenosis, CAD (based off prior noninvasive testing and medical management pursued in the setting of his advanced age), carotid artery stenosis, HTN and HLD who is currently admitted for an acute CHF exacerbation.  ? ?Assessment & Plan  ?  ?1. Acute HFrEF ?- He has a known cardiomyopathy with EF of 30-35% in 03/2021 and medical management was recommended in the setting of his advanced age and he was clear in his decision at the time of follow-up visits that he would not want to pursue additional testing.  ?- BNP elevated to 1127 on admission and CXR  showed pleural effusions and mild interstitial edema. D-dimer elevated to 1.44 but CTA has not been pursued given his renal function. If dyspnea does not improve with diuresis, could consider a VQ Scan.  ?- He was switched to IV Lasix '20mg'$  daily given hypotension and I&O's not fully recorded. Weight listed as 140 lbs today which is close to his prior baseline as this was at 142 lbs in 03/2021 but he does appear  volume overloaded on examination still. Will increase IV Lasix to '20mg'$  BID. Given his renal dysfunction, he may require higher dosing but will titrate cautiously given his hypotension. Creatinine at 2.27 today. Would recommend Palliative Care consult as previously discussed.  ? ?2. Aortic Stenosis/Aortic Regurgitation ?- He had moderate low-gradient AS and moderate to severe AI by echo in 03/2021. He has declined further evaluation including TAVR or a cardiac catheterization at prior visits given his age.  ? ?3. Intermittent Tachycardia ?- He does have episodes of tachycardia with HR in the 130's and difficult to distinguish between atrial tachycardia vs. flutter. He has been receiving PO Cardizem '30mg'$  Q8H. Given his cardiomyopathy, will stop Cardizem and switch to PO Lopressor 12.'5mg'$  BID.  ?- This patients CHA2DS2-VASc Score and unadjusted Ischemic Stroke Rate (% per year) is equal to 9.7 % stroke rate/year from a score of 6. Will review with Dr. Radford Pax but would not anticipate committing to full-dose anticoagulation unless found to have definitive atrial fibrillation. Not a good candidate either given his advanced age and fall-risk.  ? ?4. HTN ?- His BP has been soft at 102/42 - 121/52 within the past 24 hours. He has been receiving PO Cardizem '30mg'$  Q8H which is not ideal in the setting of his cardiomyopathy. Will stop and switch to PO Lopressor 12.'5mg'$  BID. PTA Imdur, Lisinopril and Amlodipine have been discontinued given his hypotension. Amlodipine was actually discontinued at his office visit in 03/2021.   ? ?5. HLD ?- He has been continued on PTA Atorvastatin '20mg'$  daily.  ? ?For questions or updates, please contact [facility] ?Please consult [email or web address] for contact info under  ? ?  ?   ?Signed, ?Matthias Hughs

## 2021-05-24 NOTE — Progress Notes (Signed)
Pt has only voided one time this shift about 300 mL. Pt is receiving IV Lasix BID so he should be urinating more. Pt states he only urinates about once in the morning at home and that's all.  ? ?Bladder scanned patient to make sure he was not retaining, and scan only showed 91 mL. Will continue to monitor and pass on night shift RN. ?

## 2021-05-25 DIAGNOSIS — I471 Supraventricular tachycardia: Secondary | ICD-10-CM | POA: Diagnosis not present

## 2021-05-25 DIAGNOSIS — I5020 Unspecified systolic (congestive) heart failure: Secondary | ICD-10-CM | POA: Diagnosis not present

## 2021-05-25 LAB — CBC
HCT: 31.5 % — ABNORMAL LOW (ref 39.0–52.0)
Hemoglobin: 10.3 g/dL — ABNORMAL LOW (ref 13.0–17.0)
MCH: 31.5 pg (ref 26.0–34.0)
MCHC: 32.7 g/dL (ref 30.0–36.0)
MCV: 96.3 fL (ref 80.0–100.0)
Platelets: 114 10*3/uL — ABNORMAL LOW (ref 150–400)
RBC: 3.27 MIL/uL — ABNORMAL LOW (ref 4.22–5.81)
RDW: 15.1 % (ref 11.5–15.5)
WBC: 5.1 10*3/uL (ref 4.0–10.5)
nRBC: 0 % (ref 0.0–0.2)

## 2021-05-25 LAB — BASIC METABOLIC PANEL
Anion gap: 7 (ref 5–15)
BUN: 75 mg/dL — ABNORMAL HIGH (ref 8–23)
CO2: 22 mmol/L (ref 22–32)
Calcium: 9.1 mg/dL (ref 8.9–10.3)
Chloride: 104 mmol/L (ref 98–111)
Creatinine, Ser: 2.36 mg/dL — ABNORMAL HIGH (ref 0.61–1.24)
GFR, Estimated: 25 mL/min — ABNORMAL LOW (ref >60)
Glucose, Bld: 154 mg/dL — ABNORMAL HIGH (ref 70–99)
Potassium: 4.6 mmol/L (ref 3.5–5.1)
Sodium: 133 mmol/L — ABNORMAL LOW (ref 135–145)

## 2021-05-25 MED ORDER — FUROSEMIDE 10 MG/ML IJ SOLN: 20.0000 mg | Freq: Every day | INTRAMUSCULAR | Status: DC

## 2021-05-25 MED ORDER — MIDODRINE HCL 5 MG PO TABS: 2.5000 mg | ORAL_TABLET | Freq: Two times a day (BID) | ORAL | Status: DC

## 2021-05-25 MED ORDER — MIDODRINE HCL 5 MG PO TABS
2.5000 mg | ORAL_TABLET | Freq: Two times a day (BID) | ORAL | Status: DC
Start: 2021-05-25 — End: 2021-05-26
  Administered 2021-05-25 – 2021-05-26 (×3): 2.5 mg via ORAL
  Filled 2021-05-25 (×3): qty 1

## 2021-05-25 MED ORDER — AMIODARONE HCL 200 MG PO TABS
200.0000 mg | ORAL_TABLET | Freq: Two times a day (BID) | ORAL | Status: DC
Start: 2021-05-25 — End: 2021-05-31
  Administered 2021-05-25 – 2021-05-31 (×12): 200 mg via ORAL
  Filled 2021-05-25 (×12): qty 1

## 2021-05-25 NOTE — Progress Notes (Signed)
BPs continue to run low, was started on Midodrine today, MD notified  ?

## 2021-05-25 NOTE — Progress Notes (Signed)
Pt resting well at this time on  spo2 100%, BIPAP on hold RT is available to apply BIPAP if needed. ?

## 2021-05-25 NOTE — Progress Notes (Signed)
? ?Progress Note ? ?Patient Name: Charles Lester ?Date of Encounter: 05/25/2021 ? ?Primary Cardiologist: Rozann Lesches, MD ? ?Subjective  ? ?Coming off BiPAP this morning.  Getting ready to eat breakfast.  No chest pain, does have shortness of breath moving around but no sense of palpitations. ? ?Inpatient Medications  ?  ?Scheduled Meds: ? aspirin EC  81 mg Oral Daily  ? atorvastatin  20 mg Oral Daily  ? Chlorhexidine Gluconate Cloth  6 each Topical Daily  ? clopidogrel  75 mg Oral Daily  ? enoxaparin (LOVENOX) injection  30 mg Subcutaneous Q24H  ? finasteride  5 mg Oral Daily  ? furosemide  20 mg Intravenous BID  ? mouth rinse  15 mL Mouth Rinse BID  ? metoprolol tartrate  12.5 mg Oral BID  ? tamsulosin  0.4 mg Oral QPC supper  ? ? ?PRN Meds: ?guaiFENesin-dextromethorphan, ipratropium-albuterol, ondansetron **OR** ondansetron (ZOFRAN) IV  ? ?Vital Signs  ?  ?Vitals:  ? 05/25/21 0400 05/25/21 0440 05/25/21 0502 05/25/21 0738  ?BP: (!) 96/40     ?Pulse: 81 (!) 53  81  ?Resp: '18 15  20  '$ ?Temp:   (!) 97.2 ?F (36.2 ?C) (!) 97.4 ?F (36.3 ?C)  ?TempSrc:   Axillary Axillary  ?SpO2: 100% 100%  100%  ?Weight:   64.2 kg   ?Height:      ? ? ?Intake/Output Summary (Last 24 hours) at 05/25/2021 0848 ?Last data filed at 05/25/2021 0848 ?Gross per 24 hour  ?Intake --  ?Output 550 ml  ?Net -550 ml  ? ?Filed Weights  ? 05/23/21 0458 05/24/21 0421 05/25/21 0502  ?Weight: 62.2 kg 63.8 kg 64.2 kg  ? ? ?Telemetry  ?  ?Sinus rhythm with multiple bursts of atrial tachycardia. Personally reviewed. ? ?ECG  ?  ?No ECG reviewed. ? ?Physical Exam  ? ?GEN: No acute distress.   ?Neck: No JVD. ?Cardiac: Irregular, 1/6 systolic murmur, no gallop.  ?Respiratory: Nonlabored.  Decreased breath sounds left base, scattered rhonchi. ?GI: Soft, nontender, bowel sounds present. ?MS: Muscle wasting. ?Neuro:  Nonfocal. ?Psych: Alert and oriented x 3. Normal affect. ? ?Labs  ?  ?Chemistry ?Recent Labs  ?Lab 05/23/21 ?0352 05/24/21 ?0415 05/25/21 ?0737  ?NA  134* 134* 133*  ?K 5.2* 5.1 4.6  ?CL 104 104 104  ?CO2 '22 22 22  '$ ?GLUCOSE 163* 190* 154*  ?BUN 58* 68* 75*  ?CREATININE 2.13* 2.27* 2.36*  ?CALCIUM 9.1 9.3 9.1  ?GFRNONAA 28* 26* 25*  ?ANIONGAP '8 8 7  '$ ?  ? ?Hematology ?Recent Labs  ?Lab 05/23/21 ?0352 05/24/21 ?0415 05/25/21 ?1062  ?WBC 5.6 5.2 5.1  ?RBC 3.23* 3.25* 3.27*  ?HGB 10.4* 10.2* 10.3*  ?HCT 31.0* 31.6* 31.5*  ?MCV 96.0 97.2 96.3  ?MCH 32.2 31.4 31.5  ?MCHC 33.5 32.3 32.7  ?RDW 14.6 15.0 15.1  ?PLT 111* 120* 114*  ? ? ?BNP ?Recent Labs  ?Lab 05/20/21 ?1307  ?BNP 1,127.0*  ?  ? ?DDimer ?Recent Labs  ?Lab 05/23/21 ?0818  ?DDIMER 1.44*  ?  ? ?Radiology  ?  ?DG CHEST PORT 1 VIEW ? ?Result Date: 05/23/2021 ?CLINICAL DATA:  Shortness of breath, hypoxia EXAM: PORTABLE CHEST 1 VIEW COMPARISON:  05/20/2021 FINDINGS: Increased bilateral pleural effusions and bibasilar atelectasis. Similar probable mild pulmonary edema. No pneumothorax. Similar cardiomediastinal contours. IMPRESSION: Increased bilateral pleural effusions and bibasilar atelectasis. Similar probable mild pulmonary edema. Electronically Signed   By: Macy Mis M.D.   On: 05/23/2021 10:15   ? ?Cardiac Studies  ? ?  Echocardiogram 03/06/2021: ? 1. Left ventricular ejection fraction, by estimation, is 30 to 35%. The  ?left ventricle has moderately decreased function. The left ventricle  ?demonstrates regional wall motion abnormalities (see scoring  ?diagram/findings for description). There is mild  ?left ventricular hypertrophy. Left ventricular diastolic parameters are  ?consistent with Grade I diastolic dysfunction (impaired relaxation).  ?Elevated left ventricular end-diastolic pressure.  ? 2. Right ventricular systolic function is normal. The right ventricular  ?size is normal. There is normal pulmonary artery systolic pressure. The  ?estimated right ventricular systolic pressure is 10.1 mmHg.  ? 3. Left atrial size was moderately dilated.  ? 4. The mitral valve is degenerative. Mild mitral valve  regurgitation.  ? 5. The aortic valve has an indeterminant number of cusps. There is  ?moderate calcification of the aortic valve. Aortic valve regurgitation is  ?moderate to severe. Moderate aortic valve stenosis with low gradient.  ?Aortic regurgitation PHT measures 210  ?msec. Aortic valve area, by VTI measures 1.37 cm?Marland Kitchen Aortic valve mean  ?gradient measures 13.0 mmHg. Dimentionless index 0.44.  ? 6. The inferior vena cava is normal in size with greater than 50%  ?respiratory variability, suggesting right atrial pressure of 3 mmHg.  ? ?Assessment & Plan  ?  ?1.  Recurrent atrial tachycardia.  Cannot completely exclude atypical atrial flutter, CHA2DS2-VASc score is 6, however not optimal anticoagulation candidate at this point.  He has been on low-dose Lopressor, AV nodal blockers limited by relative hypotension. ? ?2.  HFrEF, LVEF 30 to 35% as of February, RV contraction normal.  This has been managed conservatively, he has declined further aggressive work-up. ? ?3.  CKD stage IIIb with acute exacerbation, creatinine up to 2.36 and potassium normal at 4.6. ? ?4.  Moderate, low gradient aortic stenosis with plan for conservative management.  Also has associated moderate to severe aortic regurgitation. ? ?Chart reviewed.  Plan to start low-dose midodrine and initiate oral amiodarone load.  Continue aspirin, Lipitor, Plavix, and Lopressor.  We will cut back Lasix dosing as well.  Check ECG and BMET in AM. ? ?Signed, ?Rozann Lesches, MD  ?05/25/2021, 8:48 AM    ?

## 2021-05-25 NOTE — Progress Notes (Signed)
?PROGRESS NOTE ? ? ? ?Charles Lester  ZOX:[id number] DOB: 19-Aug-1924 DOA: 05/20/2021 ?PCP: [name], MD ? ? ?Brief Narrative:  ?Charles Lester is a 88 y.o. male with medical history significant of coronary artery disease with cardiomyopathy and heart failure with reduced EF of 30 to 35% on echo on 2/23 and moderate aortic stenosis, peripheral artery disease, type 2 diabetes that is well controlled with diet.   ? ?Patient presents with 1 week of worsening shortness of breath sputum production orthopnea and dyspnea with exertion.  Noted to be markedly hypoxic in the 70s at intake requiring nonrebreather and subsequently BiPAP for respiratory support.  Patient admitted for close monitoring, aggressive diuresis and respiratory support in the setting of heart failure exacerbation. ? ?Assessment & Plan: ?  ?Principal Problem: ?  Acute respiratory failure with hypoxia ([place]) ?Active Problems: ?  DMII (diabetes mellitus, type 2) ([place]) ?  HTN (hypertension) ?  CORONARY ATHEROSCLEROSIS NATIVE CORONARY ARTERY ?  Diastolic congestive heart failure ([place]) ?  Acute clinical systolic heart failure ([place]) ?  Sacral decubitus ulcer, stage II ([place]) ? ?Acute hypoxic respiratory failure in the setting of combined systolic/diastolic heart failure exacerbation, POA ?-Patient demonstrating improvement in his respiratory status after diuresis; currently coming off BiPAP. ?-Continue oxygen supplementation and wean off  ?-Appreciate cardiology consultation, assistance and recommendations.   ?-Still complaining of orthopnea and short winded sensation with minimal activity.  Patient also with trace to 1+ edema ?-Blood pressure remains borderline low but holding steady and patient being asymptomatic. ?-Following cardiology recommendations patient has been started on midodrine. ?-Continue adjusted dose of diuretics as per cardiology lead. ? ?D-dimer elevated ?-Concern for underlying pulmonary embolism however patient had recent CTA within the last  2 months with negative findings, cannot repeat CTA now given elevated creatinine as below; VQ also limited value at this time given bilateral effusions. ?-Discussed possibly treating patient for presumed PE however he does have some fall risk given his age.  We will hold off on anticoagulation and follow diuresis as above, discussed with family that should patient's respiratory symptoms continue despite appropriate diuresis and volume control his symptoms may be related to underlying PE. ? ?AKI without history of CKD ?-In the setting of above diuresis hypertension. ?-Continue to follow renal function trend ? ?Essential hypertension  ?-borderline hypotension appreciated. ?-Midodrine has been started ?Follow adjusted medication as recommended by cardiology service. ?-Continue to follow vital signs. ? ?History of CAD, stable -continue home medications including aspirin, statin, Plavix. ? ?Non-insulin-dependent diabetes mellitus type 2   ?-diet controlled, sliding scale insulin hypoglycemic protocol while hospitalized. ?-Continue to follow CBGs. ? ?Sacral decubitus ulcer stage II, chronic, POA  ?-continue to follow recommendation by wound care service ?-Preventive measures and conservative positioning instructed. ? ?Aortic stenosis  ?-Continue to maintain adequate BP control ?-Patient is not a candidate for surgical intervention ?-Cardiology service recommendations for medical management. ? ?Peripheral vascular disease  ?-Continue treatment with aspirin, Plavix and a statin ? ?DVT prophylaxis: Lovenox ? ?Code Status: DNR ?Family Communication: At bedside  ? ?Status is: Inpatient ? ?Dispo: The patient is from: Home ?             Anticipated d/c is to: To be determined ?             Anticipated d/c date is: 48 to 72 hours ?             Patient currently not medically stable for discharge ? ?Consultants:  ?None ? ?Procedures:  ?  None ? ?Antimicrobials:  ?None indicated ? ?Subjective: ?Some improvement appreciated in patient  respiratory status; currently coming off BiPAP.  Reports no chest pain or palpitations.  No nausea, no vomiting, no fever. ? ? ?Vitals:  ? 05/25/21 1400 05/25/21 1500 05/25/21 1536 05/25/21 1643  ?BP: (!) 89/60 (!) 78/36 (!) 90/48   ?Pulse: 93  92   ?Resp: 16 15 (!) 21   ?Temp:    97.6 ?F (36.4 ?C)  ?TempSrc:    Axillary  ?SpO2: 100%  99%   ?Weight:      ?Height:      ? ? ?Intake/Output Summary (Last 24 hours) at 05/25/2021 1811 ?Last data filed at 05/25/2021 0848 ?Gross per 24 hour  ?Intake --  ?Output 250 ml  ?Net -250 ml  ? ?Filed Weights  ? 05/23/21 0458 05/24/21 0421 05/25/21 0502  ?Weight: 62.2 kg 63.8 kg 64.2 kg  ? ? ?Examination: ?General exam: Alert, awake, oriented x 3; frail, weak and chronically ill.  Intermittently has been requiring BiPAP usage.  Currently coming off and with a stable saturation on oxygen supplementation.  No chest pain. ?Respiratory system: Decreased breath sounds at the bases, no wheezing, no using accessory muscles.  Positive fine crackles ?Cardiovascular system: Irregular, soft systolic murmur appreciated; no rubs or gallops. ?Gastrointestinal system: Abdomen is nondistended, soft and nontender. No organomegaly or masses felt. Normal bowel sounds heard. ?Central nervous system: Alert and oriented. No focal neurological deficits. ?Extremities: 1+ edema appreciated bilaterally; no cyanosis or clubbing. ?Skin: No petechiae.  Stage II pressure injury of his coccyx present at time of admission; no signs of superimposed infection. ?Psychiatry: Judgement and insight appear normal. Mood & affect appropriate.  ? ?Data Reviewed: I have personally reviewed following labs and imaging studies ? ?CBC: ?Recent Labs  ?Lab 05/21/21 ?0440 05/22/21 ?0610 05/23/21 ?0352 05/24/21 ?0415 05/25/21 ?9735  ?WBC 5.0 6.2 5.6 5.2 5.1  ?HGB 10.9* 10.2* 10.4* 10.2* 10.3*  ?HCT 31.5* 30.1* 31.0* 31.6* 31.5*  ?MCV 95.2 94.7 96.0 97.2 96.3  ?PLT 123* 112* 111* 120* 114*  ? ?Basic Metabolic Panel: ?Recent Labs  ?Lab  05/20/21 ?1507 05/21/21 ?0440 05/21/21 ?1044 05/22/21 ?0610 05/23/21 ?0352 05/24/21 ?0415 05/25/21 ?3299  ?NA 132* 136  --  134* 134* 134* 133*  ?K 4.3 5.7* 5.2* 4.9 5.2* 5.1 4.6  ?CL 102 107  --  105 104 104 104  ?CO2 21* 21*  --  '22 22 22 22  '$ ?GLUCOSE 246* 165*  --  155* 163* 190* 154*  ?BUN 47* 47*  --  52* 58* 68* 75*  ?CREATININE 1.95* 1.84*  --  1.93* 2.13* 2.27* 2.36*  ?CALCIUM 9.2 9.3  --  9.1 9.1 9.3 9.1  ?MG 2.0 2.4  --   --   --   --   --   ? ?GFR: ?Estimated Creatinine Clearance: 17.4 mL/min (A) (by C-G formula based on SCr of 2.36 mg/dL (H)). ? ?Coagulation Profile: ?Recent Labs  ?Lab 05/20/21 ?1507  ?INR 1.2  ? ? ?Recent Results (from the past 240 hour(s))  ?Resp Panel by RT-PCR (Flu A&B, Covid) Nasopharyngeal Swab     Status: None  ? Collection Time: 05/20/21  3:23 PM  ? Specimen: Nasopharyngeal Swab; Nasopharyngeal(NP) swabs in vial transport medium  ?Result Value Ref Range Status  ? SARS Coronavirus 2 by RT PCR NEGATIVE NEGATIVE Final  ?  Comment: (NOTE) ?SARS-CoV-2 target nucleic acids are NOT DETECTED. ? ?The SARS-CoV-2 RNA is generally detectable in upper respiratory ?specimens during the  acute phase of infection. The lowest ?concentration of SARS-CoV-2 viral copies this assay can detect is ?138 copies/mL. A negative result does not preclude SARS-Cov-2 ?infection and should not be used as the sole basis for treatment or ?other patient management decisions. A negative result may occur with  ?improper specimen collection/handling, submission of specimen other ?than nasopharyngeal swab, presence of viral mutation(s) within the ?areas targeted by this assay, and inadequate number of viral ?copies(<138 copies/mL). A negative result must be combined with ?clinical observations, patient history, and epidemiological ?information. The expected result is Negative. ? ?Fact Sheet for Patients:  ?EntrepreneurPulse.com.au ? ?Fact Sheet for Healthcare Providers:   ?IncredibleEmployment.be ? ?This test is no t yet approved or cleared by the [place] FDA and  ?has been authorized for detection and/or diagnosis of SARS-CoV-2 by ?FDA under an Emergency Use Authorization (E

## 2021-05-26 DIAGNOSIS — I5020 Unspecified systolic (congestive) heart failure: Secondary | ICD-10-CM | POA: Diagnosis not present

## 2021-05-26 DIAGNOSIS — I471 Supraventricular tachycardia: Secondary | ICD-10-CM | POA: Diagnosis not present

## 2021-05-26 LAB — CBC
HCT: 30.2 % — ABNORMAL LOW (ref 39.0–52.0)
Hemoglobin: 10.1 g/dL — ABNORMAL LOW (ref 13.0–17.0)
MCH: 32.4 pg (ref 26.0–34.0)
MCHC: 33.4 g/dL (ref 30.0–36.0)
MCV: 96.8 fL (ref 80.0–100.0)
Platelets: 113 10*3/uL — ABNORMAL LOW (ref 150–400)
RBC: 3.12 MIL/uL — ABNORMAL LOW (ref 4.22–5.81)
RDW: 15 % (ref 11.5–15.5)
WBC: 5.3 10*3/uL (ref 4.0–10.5)
nRBC: 0 % (ref 0.0–0.2)

## 2021-05-26 LAB — BASIC METABOLIC PANEL
Anion gap: 7 (ref 5–15)
BUN: 80 mg/dL — ABNORMAL HIGH (ref 8–23)
CO2: 23 mmol/L (ref 22–32)
Calcium: 9.2 mg/dL (ref 8.9–10.3)
Chloride: 104 mmol/L (ref 98–111)
Creatinine, Ser: 2.47 mg/dL — ABNORMAL HIGH (ref 0.61–1.24)
GFR, Estimated: 24 mL/min — ABNORMAL LOW (ref >60)
Glucose, Bld: 142 mg/dL — ABNORMAL HIGH (ref 70–99)
Potassium: 5.1 mmol/L (ref 3.5–5.1)
Sodium: 134 mmol/L — ABNORMAL LOW (ref 135–145)

## 2021-05-26 MED ORDER — SODIUM CHLORIDE 0.9 % IV SOLN
250.0000 mL | INTRAVENOUS | Status: DC
  Administered 2021-05-26: 250 mL via INTRAVENOUS

## 2021-05-26 MED ORDER — MIDODRINE HCL 5 MG PO TABS
5.0000 mg | ORAL_TABLET | Freq: Two times a day (BID) | ORAL | Status: DC
  Administered 2021-05-26: 5 mg via ORAL
  Filled 2021-05-26: qty 1

## 2021-05-26 MED ORDER — NOREPINEPHRINE 4 MG/250ML-% IV SOLN
0.0000 ug/min | INTRAVENOUS | Status: DC
  Administered 2021-05-26: 2 ug/min via INTRAVENOUS
  Filled 2021-05-26: qty 250

## 2021-05-26 MED ORDER — SODIUM CHLORIDE 0.9 % IV BOLUS
250.0000 mL | Freq: Once | INTRAVENOUS | Status: AC
Start: 2021-05-26 — End: 2021-05-26
  Administered 2021-05-26: 250 mL via INTRAVENOUS

## 2021-05-26 MED ORDER — NOREPINEPHRINE 4 MG/250ML-% IV SOLN: 0.0000 ug/min | INTRAVENOUS | Status: DC

## 2021-05-26 NOTE — Progress Notes (Signed)
?PROGRESS NOTE ? ? ? ?Charles Lester  VOZ:[id number] DOB: 02-18-1924 DOA: 05/20/2021 ?PCP: [name], MD ? ? ?Brief Narrative:  ?Charles Lester is a 88 y.o. male with medical history significant of coronary artery disease with cardiomyopathy and heart failure with reduced EF of 30 to 35% on echo on 2/23 and moderate aortic stenosis, peripheral artery disease, type 2 diabetes that is well controlled with diet.   ? ?Patient presents with 1 week of worsening shortness of breath sputum production orthopnea and dyspnea with exertion.  Noted to be markedly hypoxic in the 70s at intake requiring nonrebreather and subsequently BiPAP for respiratory support.  Patient admitted for close monitoring, aggressive diuresis and respiratory support in the setting of heart failure exacerbation. ? ?Assessment & Plan: ?  ?Principal Problem: ?  Acute respiratory failure with hypoxia ([place]) ?Active Problems: ?  DMII (diabetes mellitus, type 2) ([place]) ?  HTN (hypertension) ?  CORONARY ATHEROSCLEROSIS NATIVE CORONARY ARTERY ?  Diastolic congestive heart failure ([place]) ?  Acute clinical systolic heart failure ([place]) ?  Sacral decubitus ulcer, stage II ([place]) ? ?Acute hypoxic respiratory failure in the setting of combined systolic/diastolic heart failure exacerbation, POA ?-Patient demonstrating improvement in his respiratory status after diuresis; currently coming off BiPAP. ?-Continue oxygen supplementation and wean off  ?-Appreciate cardiology consultation, assistance and recommendations.   ?-Still complaining of orthopnea and short winded sensation with minimal activity.  Patient also with trace to 1+ edema ?-Blood pressure remains borderline low but holding steady and patient being asymptomatic. ?-Following cardiology recommendations patient has been started on midodrine and dose currently adjusted.  Follow vital signs. ?-Given further worsening of patient's creatinine we will hold diuretics today. ? ?D-dimer elevated ?-Concern for underlying  pulmonary embolism however patient had recent CTA within the last 2 months with negative findings, cannot repeat CTA now given elevated creatinine as below; VQ also limited value at this time given bilateral effusions. ?-Discussed possibly treating patient for presumed PE however he does have some fall risk given his age.  We will hold off on anticoagulation and follow diuresis as above, discussed with family that should patient's respiratory symptoms continue despite appropriate diuresis and volume control his symptoms may be related to underlying PE. ? ?AKI without history of CKD ?-In the setting of above diuresis and decreased perfusion with acute on chronic CHF. ?-Continue to follow renal function trend ?-Creatinine is still not back to baseline ?-Holding diuretics today. ? ?Essential hypertension  ?-borderline hypotension appreciated. ?-Midodrine has been adjusted following cardiology recommendations. ?-Continue to follow vital signs. ? ?History of CAD, stable -continue home medications including aspirin, statin, Plavix. ?-No chest pain currently. ? ?Non-insulin-dependent diabetes mellitus type 2   ?-diet controlled, sliding scale insulin hypoglycemic protocol while hospitalized. ?-Continue to follow CBGs. ? ?Sacral decubitus ulcer stage II, chronic, POA  ?-continue to follow recommendation by wound care service ?-Preventive measures and conservative positioning instructed. ?-No signs of superimposed infection appreciated. ? ?Aortic stenosis  ?-Continue to maintain adequate BP control ?-Patient is not a candidate for surgical intervention ?-Cardiology service recommendations for medical management. ?-Overall stable. ? ?Peripheral vascular disease  ?-Continue treatment with aspirin, Plavix and a statin ? ?DVT prophylaxis: Lovenox ? ?Code Status: DNR ?Family Communication: At bedside  ? ?Status is: Inpatient ? ?Dispo: The patient is from: Home ?             Anticipated d/c is to: To be determined ?  Anticipated d/c date is: 48 hours ?             Patient currently not medically stable for discharge ? ?Consultants:  ?None ? ?Procedures:  ?None ? ?Antimicrobials:  ?None indicated ? ?Subjective: ?Patient has ended requiring the use of BiPAP overnight; reports no chest pain, no nausea, no vomiting and no shortness of breath at rest.  Still short winded with activity.  Nasal cannula supplementation in place.  Patient chronically ill in appearance, weak, frail and deconditioned. ? ?Vitals:  ? 05/26/21 1134 05/26/21 1200 05/26/21 1300 05/26/21 1623  ?BP:      ?Pulse: 78 (!) 51 82   ?Resp:   20   ?Temp: (!) 97.5 ?F (36.4 ?C)   (!) 97.2 ?F (36.2 ?C)  ?TempSrc: Oral   Oral  ?SpO2: 100% 96% 100%   ?Weight:      ?Height:      ? ? ?Intake/Output Summary (Last 24 hours) at 05/26/2021 1847 ?Last data filed at 05/26/2021 1104 ?Gross per 24 hour  ?Intake --  ?Output 200 ml  ?Net -200 ml  ? ?Filed Weights  ? 05/24/21 0421 05/25/21 0502 05/26/21 0500  ?Weight: 63.8 kg 64.2 kg 64.6 kg  ? ? ?Examination: ?General exam: Alert, awake, oriented x 3; frail and weak, chronically ill in appearance; reporting no chest pain, nausea or vomiting currently.  Still short winded with activity. ?Respiratory system: Improved air movement bilaterally; positive rhonchi, no frank crackles appreciated. ?Cardiovascular system: Rate controlled, irregular rhythm; no rubs, no gallops, positive systolic murmur.  No JVD on exam. ?Gastrointestinal system: Abdomen is nondistended, soft and nontender. No organomegaly or masses felt. Normal bowel sounds heard. ?Central nervous system: Alert and oriented. No focal neurological deficits. ?Extremities: No cyanosis or clubbing; trace edema appreciated. ?Skin: No petechiae.  Present at time of admission stage II pressure injury on his coccyx; no signs of superimposed infection appreciated. ?Psychiatry: Judgement and insight appear normal. Mood & affect appropriate.  ? ?Data Reviewed: I have personally reviewed  following labs and imaging studies ? ?CBC: ?Recent Labs  ?Lab 05/22/21 ?0610 05/23/21 ?0352 05/24/21 ?0415 05/25/21 ?1638 05/26/21 ?4665  ?WBC 6.2 5.6 5.2 5.1 5.3  ?HGB 10.2* 10.4* 10.2* 10.3* 10.1*  ?HCT 30.1* 31.0* 31.6* 31.5* 30.2*  ?MCV 94.7 96.0 97.2 96.3 96.8  ?PLT 112* 111* 120* 114* 113*  ? ?Basic Metabolic Panel: ?Recent Labs  ?Lab 05/20/21 ?1507 05/21/21 ?0440 05/21/21 ?1044 05/22/21 ?0610 05/23/21 ?0352 05/24/21 ?0415 05/25/21 ?9935 05/26/21 ?7017  ?NA 132* 136  --  134* 134* 134* 133* 134*  ?K 4.3 5.7*   < > 4.9 5.2* 5.1 4.6 5.1  ?CL 102 107  --  105 104 104 104 104  ?CO2 21* 21*  --  '22 22 22 22 23  '$ ?GLUCOSE 246* 165*  --  155* 163* 190* 154* 142*  ?BUN 47* 47*  --  52* 58* 68* 75* 80*  ?CREATININE 1.95* 1.84*  --  1.93* 2.13* 2.27* 2.36* 2.47*  ?CALCIUM 9.2 9.3  --  9.1 9.1 9.3 9.1 9.2  ?MG 2.0 2.4  --   --   --   --   --   --   ? < > = values in this interval not displayed.  ? ?GFR: ?Estimated Creatinine Clearance: 16.7 mL/min (A) (by C-G formula based on SCr of 2.47 mg/dL (H)). ? ?Coagulation Profile: ?Recent Labs  ?Lab 05/20/21 ?1507  ?INR 1.2  ? ? ?Recent Results (from the past 240 hour(s))  ?Resp  Panel by RT-PCR (Flu A&B, Covid) Nasopharyngeal Swab     Status: None  ? Collection Time: 05/20/21  3:23 PM  ? Specimen: Nasopharyngeal Swab; Nasopharyngeal(NP) swabs in vial transport medium  ?Result Value Ref Range Status  ? SARS Coronavirus 2 by RT PCR NEGATIVE NEGATIVE Final  ?  Comment: (NOTE) ?SARS-CoV-2 target nucleic acids are NOT DETECTED. ? ?The SARS-CoV-2 RNA is generally detectable in upper respiratory ?specimens during the acute phase of infection. The lowest ?concentration of SARS-CoV-2 viral copies this assay can detect is ?138 copies/mL. A negative result does not preclude SARS-Cov-2 ?infection and should not be used as the sole basis for treatment or ?other patient management decisions. A negative result may occur with  ?improper specimen collection/handling, submission of specimen  other ?than nasopharyngeal swab, presence of viral mutation(s) within the ?areas targeted by this assay, and inadequate number of viral ?copies(<138 copies/mL). A negative result must be combined with ?clinical ob

## 2021-05-26 NOTE — Progress Notes (Signed)
? ?Progress Note ? ?Patient Name: Charles Lester ?Date of Encounter: 05/26/2021 ? ?Primary Cardiologist: Rozann Lesches, MD ? ?Subjective  ? ?No chest pain no shortness of breath at rest.  Still weak when he gets up and moves around.  No leg swelling. ? ?Inpatient Medications  ?  ?Scheduled Meds: ? amiodarone  200 mg Oral BID  ? aspirin EC  81 mg Oral Daily  ? atorvastatin  20 mg Oral Daily  ? Chlorhexidine Gluconate Cloth  6 each Topical Daily  ? clopidogrel  75 mg Oral Daily  ? enoxaparin (LOVENOX) injection  30 mg Subcutaneous Q24H  ? finasteride  5 mg Oral Daily  ? furosemide  20 mg Intravenous Daily  ? mouth rinse  15 mL Mouth Rinse BID  ? metoprolol tartrate  12.5 mg Oral BID  ? midodrine  2.5 mg Oral BID WC  ? tamsulosin  0.4 mg Oral QPC supper  ? ? ?PRN Meds: ?guaiFENesin-dextromethorphan, ipratropium-albuterol, ondansetron **OR** ondansetron (ZOFRAN) IV  ? ?Vital Signs  ?  ?Vitals:  ? 05/26/21 0236 05/26/21 0400 05/26/21 0500 05/26/21 0724  ?BP:      ?Pulse:    (!) 103  ?Resp: (!) 25   (!) 22  ?Temp:  98 ?F (36.7 ?C)  97.8 ?F (36.6 ?C)  ?TempSrc:  Axillary  Oral  ?SpO2:    100%  ?Weight:   64.6 kg   ?Height:      ? ? ?Intake/Output Summary (Last 24 hours) at 05/26/2021 0845 ?Last data filed at 05/25/2021 0848 ?Gross per 24 hour  ?Intake --  ?Output 250 ml  ?Net -250 ml  ? ?Filed Weights  ? 05/24/21 0421 05/25/21 0502 05/26/21 0500  ?Weight: 63.8 kg 64.2 kg 64.6 kg  ? ? ?Telemetry  ?  ?Sinus rhythm with continued episodes of atrial tachycardia, generally brief. Personally reviewed. ? ?ECG  ?  ?I personally reviewed his ECG from today which shows sinus rhythm with IVCD, frequent PACs. ? ?Physical Exam  ? ?GEN: No acute distress.   ?Neck: No JVD. ?Cardiac: RRR with frequent ectopy, 1/6 systolic murmur without gallop.Marland Kitchen  ?Respiratory: Nonlabored.  Decreased breath sounds at the bases. ?GI: Soft, nontender, bowel sounds present. ?MS: Muscle wasting. ?Neuro:  Nonfocal. ?Psych: Alert and oriented x 3. Normal  affect. ? ?Labs  ?  ?Chemistry ?Recent Labs  ?Lab 05/24/21 ?0415 05/25/21 ?0932 05/26/21 ?6712  ?NA 134* 133* 134*  ?K 5.1 4.6 5.1  ?CL 104 104 104  ?CO2 '22 22 23  '$ ?GLUCOSE 190* 154* 142*  ?BUN 68* 75* 80*  ?CREATININE 2.27* 2.36* 2.47*  ?CALCIUM 9.3 9.1 9.2  ?GFRNONAA 26* 25* 24*  ?ANIONGAP '8 7 7  '$ ?  ? ?Hematology ?Recent Labs  ?Lab 05/24/21 ?0415 05/25/21 ?4580 05/26/21 ?9983  ?WBC 5.2 5.1 5.3  ?RBC 3.25* 3.27* 3.12*  ?HGB 10.2* 10.3* 10.1*  ?HCT 31.6* 31.5* 30.2*  ?MCV 97.2 96.3 96.8  ?MCH 31.4 31.5 32.4  ?MCHC 32.3 32.7 33.4  ?RDW 15.0 15.1 15.0  ?PLT 120* 114* 113*  ? ? ?BNP ?Recent Labs  ?Lab 05/20/21 ?1307  ?BNP 1,127.0*  ?  ? ?DDimer ?Recent Labs  ?Lab 05/23/21 ?0818  ?DDIMER 1.44*  ?  ? ?Radiology  ?  ?No results found. ? ?Cardiac Studies  ? ?Echocardiogram 03/06/2021: ? 1. Left ventricular ejection fraction, by estimation, is 30 to 35%. The  ?left ventricle has moderately decreased function. The left ventricle  ?demonstrates regional wall motion abnormalities (see scoring  ?diagram/findings for description). There  is mild  ?left ventricular hypertrophy. Left ventricular diastolic parameters are  ?consistent with Grade I diastolic dysfunction (impaired relaxation).  ?Elevated left ventricular end-diastolic pressure.  ? 2. Right ventricular systolic function is normal. The right ventricular  ?size is normal. There is normal pulmonary artery systolic pressure. The  ?estimated right ventricular systolic pressure is 50.9 mmHg.  ? 3. Left atrial size was moderately dilated.  ? 4. The mitral valve is degenerative. Mild mitral valve regurgitation.  ? 5. The aortic valve has an indeterminant number of cusps. There is  ?moderate calcification of the aortic valve. Aortic valve regurgitation is  ?moderate to severe. Moderate aortic valve stenosis with low gradient.  ?Aortic regurgitation PHT measures 210  ?msec. Aortic valve area, by VTI measures 1.37 cm?Marland Kitchen Aortic valve mean  ?gradient measures 13.0 mmHg. Dimentionless  index 0.44.  ? 6. The inferior vena cava is normal in size with greater than 50%  ?respiratory variability, suggesting right atrial pressure of 3 mmHg.  ? ?Assessment & Plan  ?  ?1.  Recurrent atrial tachycardia.  Cannot completely exclude atypical atrial flutter, CHA2DS2-VASc score is 6, however not optimal anticoagulation candidate at this point.  He has been on low-dose Lopressor and amiodarone was added yesterday. ? ?2.  HFrEF, LVEF 30 to 35% as of February, RV contraction normal.  This has been managed conservatively, he has declined further aggressive work-up. ? ?3.  CKD stage IIIb with acute exacerbation, creatinine up to 2.47.  Lasix was decreased yesterday. ? ?4.  Moderate, low gradient aortic stenosis with plan for conservative management.  Also has associated moderate to severe aortic regurgitation. ? ?Plan to uptitrate midodrine, hold Lasix today.  Otherwise continue aspirin, Plavix, low-dose amiodarone load, Lopressor, and Lipitor. ? ?Signed, ?Rozann Lesches, MD  ?05/26/2021, 8:45 AM    ?

## 2021-05-26 NOTE — Progress Notes (Signed)
BIPAP placed on patient per patient request ?

## 2021-05-26 NOTE — Progress Notes (Signed)
Bipap order is PRN.  Not needed at this time. 

## 2021-05-27 DIAGNOSIS — I5020 Unspecified systolic (congestive) heart failure: Secondary | ICD-10-CM | POA: Diagnosis not present

## 2021-05-27 DIAGNOSIS — I471 Supraventricular tachycardia: Secondary | ICD-10-CM | POA: Diagnosis not present

## 2021-05-27 MED ORDER — MIDODRINE HCL 5 MG PO TABS
5.0000 mg | ORAL_TABLET | Freq: Three times a day (TID) | ORAL | Status: DC
  Administered 2021-05-27 – 2021-05-31 (×12): 5 mg via ORAL
  Filled 2021-05-27 (×13): qty 1

## 2021-05-27 MED ORDER — SODIUM CHLORIDE 0.9 % IV BOLUS
250.0000 mL | Freq: Once | INTRAVENOUS | Status: AC
  Administered 2021-05-27: 250 mL via INTRAVENOUS

## 2021-05-27 MED ORDER — AMIODARONE IV BOLUS ONLY 150 MG/100ML
150.0000 mg | Freq: Once | INTRAVENOUS | Status: AC
  Administered 2021-05-27: 150 mg via INTRAVENOUS
  Filled 2021-05-27: qty 100

## 2021-05-27 NOTE — Progress Notes (Signed)
Just received bedside report on Charles Lester - he's heart rhythm is ST with heart rate as high as 141 sustaining between 1 teens & 1 30s - he was started on Levo last night & it's running at 39mg at this time - current BP 106/43; Dr.Madera notified - no new orders at this time ?

## 2021-05-27 NOTE — Progress Notes (Signed)
New orders received from [facility] as follows:  ? ?1.) Midodrine increased to TID; try to wean off Levo. MAP goal is 60 ? ?2.) Amiodarone bolus '150mg'$  X1 ordered now ? ?3.) Continue lopressor at current dose and resume his oral amiodarone doses as currently ordered  ?

## 2021-05-27 NOTE — Progress Notes (Signed)
?PROGRESS NOTE ? ? ? ?Charles Lester  EVO:[id number] DOB: [birth year] DOA: 05/20/2021 ?PCP: [name], MD ? ? ?Brief Narrative:  ?Charles Lester is a 88 y.o. male with medical history significant of coronary artery disease with cardiomyopathy and heart failure with reduced EF of 30 to 35% on echo on 2/23 and moderate aortic stenosis, peripheral artery disease, type 2 diabetes that is well controlled with diet.   ? ?Patient presents with 1 week of worsening shortness of breath sputum production orthopnea and dyspnea with exertion.  Noted to be markedly hypoxic in the 70s at intake requiring nonrebreather and subsequently BiPAP for respiratory support.  Patient admitted for close monitoring, aggressive diuresis and respiratory support in the setting of heart failure exacerbation. ? ?Assessment & Plan: ?  ?Principal Problem: ?  Acute respiratory failure with hypoxia ([place]) ?Active Problems: ?  DMII (diabetes mellitus, type 2) ([place]) ?  HTN (hypertension) ?  CORONARY ATHEROSCLEROSIS NATIVE CORONARY ARTERY ?  Diastolic congestive heart failure ([place]) ?  Acute clinical systolic heart failure ([place]) ?  Sacral decubitus ulcer, stage II ([place]) ? ?Acute hypoxic respiratory failure in the setting of combined systolic/diastolic heart failure exacerbation, POA ?-Patient demonstrating improvement in his respiratory status after diuresis; currently coming off BiPAP. ?-Continue oxygen supplementation and wean off as tolerated. ?-Appreciate cardiology consultation, assistance and recommendations.   ?-Still complaining of orthopnea and short winded sensation with minimal activity.  Patient also with trace edema ?-Blood pressure has remained low overnight with MAP less than 65 at that ended requiring the use of Levophed temporarily.   ?-Following cardiology recommendations patient has been started on midodrine and dose currently adjusted.   ?-Continue to follow blood pressure and wean off pressors as tolerated.  . ?-No further diuresis given  worsening renal function. ?-Overall prognosis is very poor and lengthy discussion with family at bedside assisting today.  Plan is to treat without heroic measures or invasive therapy. ?-If patient failed to respond will transition to comfort care. ? ?D-dimer elevated ?-Concern for underlying pulmonary embolism however patient had recent CTA within the last 2 months with negative findings, cannot repeat CTA now given elevated creatinine as below; VQ also limited value at this time given bilateral effusions. ?-Discussed possibly treating patient for presumed PE however he does have some fall risk given his age.  We will hold off on anticoagulation and follow diuresis as above, discussed with family that should patient's respiratory symptoms continue despite appropriate diuresis and volume control his symptoms may be related to underlying PE. ? ?AKI without history of CKD ?-In the setting of above diuresis and decreased perfusion with acute on chronic CHF. ?-Continue to follow renal function trend ?-Creatinine has continued to worse and creatinine is currently 2.47  ?-Continue to hold diuretics. ?-Try to maintain adequate blood pressure. ? ?Essential hypertension  ?-Continue to be significantly hypotensive with overnight needs for pressure support. ?-Midodrine has been adjusted following cardiology recommendations and increase to 3 times a day.. ?-Continue to follow vital signs. ? ?History of CAD, stable -continue home medications including aspirin, statin, Plavix. ?-No chest pain currently. ? ?Non-insulin-dependent diabetes mellitus type 2   ?-diet controlled, sliding scale insulin hypoglycemic protocol while hospitalized. ?-Continue to follow CBGs. ? ?Sacral decubitus ulcer stage II, chronic, POA  ?-continue to follow recommendation by wound care service ?-Preventive measures and conservative positioning instructed. ?-No signs of superimposed infection appreciated. ? ?Aortic stenosis  ?-Continue to maintain adequate  BP control ?-Patient is not a candidate for surgical intervention ?-  Cardiology service recommendations for medical management. ?-Overall stable. ? ?Peripheral vascular disease  ?-Continue treatment with aspirin, Plavix and a statin ? ?Atrial tachycardia: ?-Unable to fully exclude atypical atrial flutter; CHA2DS2-VASc score above 6.  Patient is not a candidate for anticoagulation ?-Continue low-dose Lopressor as tolerated with his blood pressure and continue the use of amiodarone. ?-Continue aspirin and Plavix. ?-Palliative care discussion for advance care planning and further decision in the setting of impending decline despite aggressive intervention. ? ? ?DVT prophylaxis: Lovenox ? ?Code Status: DNR ?Family Communication: Wife and daughter at bedside  ? ?Status is: Inpatient ? ?Dispo: The patient is from: Home ?             Anticipated d/c is to: To be determined ?             Anticipated d/c date is: 48 hours ?             Patient currently not medically stable for discharge ? ?Consultants:  ?None ? ?Procedures:  ?None ? ?Antimicrobials:  ?None indicated ? ?Subjective: ?Overnight with significant hypertension requiring the use of pressor support.  Has also experience recurrent atrial tachycardia.  Denies chest pain, no nausea, no vomiting.  Transient use of BiPAP earlier this morning.  Continues to be short winded with activity, requiring oxygen supplementation and expressing mild orthopnea.  Patient is complaining of pain in his sacral/coccyx pressure injury. ? ?Vitals:  ? 05/27/21 1204 05/27/21 1300 05/27/21 1400 05/27/21 1407  ?BP:  (!) 97/46  (!) 99/34  ?Pulse:  85 (!) 102 98  ?Resp:  20 (!) 24 19  ?Temp: 97.7 ?F (36.5 ?C)     ?TempSrc: Oral     ?SpO2:  100% 100% 100%  ?Weight:      ?Height:      ? ? ?Intake/Output Summary (Last 24 hours) at 05/27/2021 1450 ?Last data filed at 05/27/2021 1411 ?Gross per 24 hour  ?Intake --  ?Output 690 ml  ?Net -690 ml  ? ?Filed Weights  ? 05/25/21 0502 05/26/21 0500 05/27/21  0500  ?Weight: 64.2 kg 64.6 kg 65.6 kg  ? ? ?Examination: ?General exam: Alert, awake, oriented x 3; oxygen supplementation in place; denies chest pain, no nausea, no vomiting.  Still short winded with activity.  Chronically ill, frail and deconditioned. ?Respiratory system: The creased breath sounds at the bases, no frank crackles, no using accessory muscles at rest.  2 L nasal cannula supplementation in place. (But patient required transient use of BiPAP early this morning). ?Cardiovascular system: Irregular, no rubs, no gallops, no JVD.  Soft systolic murmur appreciated.   ?Gastrointestinal system: Abdomen is nondistended, soft and nontender. No organomegaly or masses felt. Normal bowel sounds heard. ?Central nervous system: Alert and oriented. No focal neurological deficits. ?Extremities: No cyanosis or clubbing.  No edema appreciated. ?Skin: No petechiae; pressure injury appreciated in his sacrum/coccyx, stage II, no signs of superimposed infection; present at time of admission. ?Psychiatry: Judgement and insight appear normal. Mood & affect appropriate.  ? ?Data Reviewed: I have personally reviewed following labs and imaging studies ? ?CBC: ?Recent Labs  ?Lab 05/22/21 ?0610 05/23/21 ?0352 05/24/21 ?0415 05/25/21 ?1540 05/26/21 ?0867  ?WBC 6.2 5.6 5.2 5.1 5.3  ?HGB 10.2* 10.4* 10.2* 10.3* 10.1*  ?HCT 30.1* 31.0* 31.6* 31.5* 30.2*  ?MCV 94.7 96.0 97.2 96.3 96.8  ?PLT 112* 111* 120* 114* 113*  ? ?Basic Metabolic Panel: ?Recent Labs  ?Lab 05/20/21 ?1507 05/21/21 ?0440 05/21/21 ?1044 05/22/21 ?6195 05/23/21 ?0932 05/24/21 ?6712  05/25/21 ?3875 05/26/21 ?6433  ?NA 132* 136  --  134* 134* 134* 133* 134*  ?K 4.3 5.7*   < > 4.9 5.2* 5.1 4.6 5.1  ?CL 102 107  --  105 104 104 104 104  ?CO2 21* 21*  --  '22 22 22 22 23  '$ ?GLUCOSE 246* 165*  --  155* 163* 190* 154* 142*  ?BUN 47* 47*  --  52* 58* 68* 75* 80*  ?CREATININE 1.95* 1.84*  --  1.93* 2.13* 2.27* 2.36* 2.47*  ?CALCIUM 9.2 9.3  --  9.1 9.1 9.3 9.1 9.2  ?MG 2.0 2.4  --    --   --   --   --   --   ? < > = values in this interval not displayed.  ? ?GFR: ?Estimated Creatinine Clearance: 17 mL/min (A) (by C-G formula based on SCr of 2.47 mg/dL (H)). ? ?Coagulation Prof

## 2021-05-27 NOTE — Care Management Important Message (Signed)
Important Message ? ?Patient Details  ?Name: Charles Lester ?MRN: [id number] ?Date of Birth: September 11, 1924 ? ? ?Medicare Important Message Given:  Yes ? ? ? ? ?[name] ?05/27/2021, 2:31 PM ?

## 2021-05-27 NOTE — Progress Notes (Signed)
Patient ambulated with walker aprox 7f from bed to toilet & back after having a BM, he was barely able to return back to bed due to SOB, this RN & NT had to assist him back into bed when before he was able to do more for himself, he reported that he is feeling worse & requested to have his BiPAP on, BiPAP placed as requested by patient for aprox 873ms then patient requested to have it removed & placed back on O2 [place] @ 2L, patient placed back on O2  @ 2L at this time with no respiratory distress or c/o noted, O2 SAT 100%, MD made aware  ?

## 2021-05-27 NOTE — Progress Notes (Signed)
Was going to pull Bipap out of room, but when I came to do it, RN had placed patient back on Bipap.  RN stated patient's rate went up to upper 20s and patient asked to be placed back on Bipap.  When I walked in to assess patient, patient wanted to come off Bipap but I explained that if he felt like he needed it earlier, maybe he needed to give himself so more time on it before coming off so soon.   Bipap is still at bedside and patient currently using.  Asked RN to call if she needed me further. ?

## 2021-05-27 NOTE — Progress Notes (Signed)
Patient is currently on 2L [place], no signs of distress noted at this time.  O2 sat is 100%.  Bipap not needed at this time. ?

## 2021-05-27 NOTE — Progress Notes (Signed)
Patient weaned off of Levophed gtt @ 0092 as ordered, MAP > 60 at this time, MD made aware  ?

## 2021-05-27 NOTE — Progress Notes (Signed)
? ?Progress Note ? ?Patient Name: Charles Lester ?Date of Encounter: 05/27/2021 ? ?Primary Cardiologist: Rozann Lesches, MD ? ?Subjective  ? ?Feels weak today although mentions that his breathlessness is generally better.  No palpitations or chest pain. ? ?Inpatient Medications  ?  ?Scheduled Meds: ? amiodarone  200 mg Oral BID  ? aspirin EC  81 mg Oral Daily  ? atorvastatin  20 mg Oral Daily  ? Chlorhexidine Gluconate Cloth  6 each Topical Daily  ? clopidogrel  75 mg Oral Daily  ? enoxaparin (LOVENOX) injection  30 mg Subcutaneous Q24H  ? finasteride  5 mg Oral Daily  ? mouth rinse  15 mL Mouth Rinse BID  ? metoprolol tartrate  12.5 mg Oral BID  ? midodrine  5 mg Oral TID WC  ? tamsulosin  0.4 mg Oral QPC supper  ? ? sodium chloride 250 mL (05/26/21 2228)  ? norepinephrine (LEVOPHED) Adult infusion 5 mcg/min (05/27/21 0552)  ? ?PRN Meds: ?guaiFENesin-dextromethorphan, ipratropium-albuterol, ondansetron **OR** ondansetron (ZOFRAN) IV  ? ?Vital Signs  ?  ?Vitals:  ? 05/27/21 0600 05/27/21 0615 05/27/21 0630 05/27/21 0737  ?BP: (!) 104/36 (!) 101/43 (!) 106/45   ?Pulse: 85 100 87   ?Resp: 17 (!) 21 17   ?Temp:    97.6 ?F (36.4 ?C)  ?TempSrc:    Oral  ?SpO2: 100% 100% 100%   ?Weight:      ?Height:      ? ? ?Intake/Output Summary (Last 24 hours) at 05/27/2021 0904 ?Last data filed at 05/27/2021 [id number] ?Gross per 24 hour  ?Intake --  ?Output 540 ml  ?Net -540 ml  ? ?Filed Weights  ? 05/25/21 0502 05/26/21 0500 05/27/21 0500  ?Weight: 64.2 kg 64.6 kg 65.6 kg  ? ? ?Telemetry  ?  ?Sinus rhythm with intermittent atrial ectopy somewhat less frequent over the last 24 hours. Personally reviewed. ? ?ECG  ?  ?I personally reviewed his ECG from April 27 showing sinus rhythm with frequent atrial ectopy, IVCD and repolarization abnormalities. ? ?Physical Exam  ? ?GEN: No acute distress.   ?Neck: No JVD. ?Cardiac: RRR with ectopy, 1/6 systolic murmur without gallop.Marland Kitchen  ?Respiratory: Nonlabored.  Decreased breath sounds at the bases. ?GI:  Soft, nontender, bowel sounds present. ?MS: Muscle wasting. ?Neuro:  Nonfocal. ?Psych: Alert and oriented x 3. Normal affect. ? ?Labs  ?  ?Chemistry ?Recent Labs  ?Lab 05/24/21 ?0415 05/25/21 ?1583 05/26/21 ?0940  ?NA 134* 133* 134*  ?K 5.1 4.6 5.1  ?CL 104 104 104  ?CO2 '22 22 23  '$ ?GLUCOSE 190* 154* 142*  ?BUN 68* 75* 80*  ?CREATININE 2.27* 2.36* 2.47*  ?CALCIUM 9.3 9.1 9.2  ?GFRNONAA 26* 25* 24*  ?ANIONGAP '8 7 7  '$ ?  ? ?Hematology ?Recent Labs  ?Lab 05/24/21 ?0415 05/25/21 ?7680 05/26/21 ?8811  ?WBC 5.2 5.1 5.3  ?RBC 3.25* 3.27* 3.12*  ?HGB 10.2* 10.3* 10.1*  ?HCT 31.6* 31.5* 30.2*  ?MCV 97.2 96.3 96.8  ?MCH 31.4 31.5 32.4  ?MCHC 32.3 32.7 33.4  ?RDW 15.0 15.1 15.0  ?PLT 120* 114* 113*  ? ? ?BNP ?Recent Labs  ?Lab 05/20/21 ?1307  ?BNP 1,127.0*  ?  ? ?DDimer ?Recent Labs  ?Lab 05/23/21 ?0818  ?DDIMER 1.44*  ?  ? ?Radiology  ?  ?No results found. ? ?Cardiac Studies  ? ?Echocardiogram 03/06/2021: ? 1. Left ventricular ejection fraction, by estimation, is 30 to 35%. The  ?left ventricle has moderately decreased function. The left ventricle  ?demonstrates regional wall motion  abnormalities (see scoring  ?diagram/findings for description). There is mild  ?left ventricular hypertrophy. Left ventricular diastolic parameters are  ?consistent with Grade I diastolic dysfunction (impaired relaxation).  ?Elevated left ventricular end-diastolic pressure.  ? 2. Right ventricular systolic function is normal. The right ventricular  ?size is normal. There is normal pulmonary artery systolic pressure. The  ?estimated right ventricular systolic pressure is 26.3 mmHg.  ? 3. Left atrial size was moderately dilated.  ? 4. The mitral valve is degenerative. Mild mitral valve regurgitation.  ? 5. The aortic valve has an indeterminant number of cusps. There is  ?moderate calcification of the aortic valve. Aortic valve regurgitation is  ?moderate to severe. Moderate aortic valve stenosis with low gradient.  ?Aortic regurgitation PHT measures 210   ?msec. Aortic valve area, by VTI measures 1.37 cm?Marland Kitchen Aortic valve mean  ?gradient measures 13.0 mmHg. Dimentionless index 0.44.  ? 6. The inferior vena cava is normal in size with greater than 50%  ?respiratory variability, suggesting right atrial pressure of 3 mmHg.  ? ?Assessment & Plan  ?  ?1.  Recurrent atrial tachycardia.  Cannot completely exclude atypical atrial flutter, CHA2DS2-VASc score is 6, however not optimal anticoagulation candidate at this point.  He has been on low-dose Lopressor and oral amiodarone was added.  He generally has less ectopy at this point. ? ?2.  Relative hypotension.  He was already started on midodrine, placed on low-dose Levophed by covering team overnight.  Already taken off diuretics. ? ?2.  HFrEF, LVEF 30 to 35% as of February, RV contraction normal.  This has been managed conservatively, he has declined further aggressive work-up. ? ?3.  CKD stage IIIb with acute exacerbation, creatinine up to 2.47.  Lasix was decreased yesterday. ? ?4.  Moderate, low gradient aortic stenosis with plan for conservative management.  Also has associated moderate to severe aortic regurgitation. ? ?Would attempt to wean off Levophed.  Increase midodrine to 5 mg 3 times a day.  Continue to hold diuretics.  Would try and maintain low-dose Lopressor along with amiodarone load.  Also aspirin, Plavix, and Lipitor.  He is already DNR, may need to have palliative care discussions with general decline and limited options for definitive treatment at advanced age. ? ?Signed, ?Rozann Lesches, MD  ?05/27/2021, 9:04 AM    ?

## 2021-05-28 DIAGNOSIS — I5020 Unspecified systolic (congestive) heart failure: Secondary | ICD-10-CM | POA: Diagnosis not present

## 2021-05-28 DIAGNOSIS — I471 Supraventricular tachycardia: Secondary | ICD-10-CM | POA: Diagnosis not present

## 2021-05-28 LAB — BASIC METABOLIC PANEL
Anion gap: 7 (ref 5–15)
BUN: 92 mg/dL — ABNORMAL HIGH (ref 8–23)
CO2: 23 mmol/L (ref 22–32)
Calcium: 8.8 mg/dL — ABNORMAL LOW (ref 8.9–10.3)
Chloride: 103 mmol/L (ref 98–111)
Creatinine, Ser: 2.68 mg/dL — ABNORMAL HIGH (ref 0.61–1.24)
GFR, Estimated: 21 mL/min — ABNORMAL LOW (ref >60)
Glucose, Bld: 128 mg/dL — ABNORMAL HIGH (ref 70–99)
Potassium: 5 mmol/L (ref 3.5–5.1)
Sodium: 133 mmol/L — ABNORMAL LOW (ref 135–145)

## 2021-05-28 MED ORDER — MIDODRINE HCL 5 MG PO TABS
10.0000 mg | ORAL_TABLET | Freq: Once | ORAL | Status: AC
Start: 2021-05-28 — End: 2021-05-28
  Administered 2021-05-28: 10 mg via ORAL
  Filled 2021-05-28: qty 2

## 2021-05-28 MED ORDER — METOPROLOL SUCCINATE ER 25 MG PO TB24
12.5000 mg | ORAL_TABLET | Freq: Every day | ORAL | Status: DC
  Administered 2021-05-29 – 2021-05-31 (×3): 12.5 mg via ORAL
  Filled 2021-05-28 (×3): qty 1

## 2021-05-28 MED ORDER — SODIUM CHLORIDE 0.9 % IV BOLUS
250.0000 mL | Freq: Once | INTRAVENOUS | Status: DC
Start: 2021-05-28 — End: 2021-05-31

## 2021-05-28 NOTE — Progress Notes (Signed)
Patient declining to be turned completely to be off sacrum; health teachings rendered.  ?Patient now agreeable to turn.  ?

## 2021-05-28 NOTE — Progress Notes (Signed)
Pt resting well with no distress noted on [place] spo2 100% BIPAP on standby if needed ?

## 2021-05-28 NOTE — Progress Notes (Signed)
?PROGRESS NOTE ? ? ? ?Charles Lester  EGB:[id number] DOB: 12/29/1924 DOA: 05/20/2021 ?PCP: [name], MD ? ? ?Brief Narrative:  ?Charles Lester is a 88 y.o. male with medical history significant of coronary artery disease with cardiomyopathy and heart failure with reduced EF of 30 to 35% on echo on 2/23 and moderate aortic stenosis, peripheral artery disease, type 2 diabetes that is well controlled with diet.   ? ?Patient presents with 1 week of worsening shortness of breath sputum production orthopnea and dyspnea with exertion.  Noted to be markedly hypoxic in the 70s at intake requiring nonrebreather and subsequently BiPAP for respiratory support.  Patient admitted for close monitoring, aggressive diuresis and respiratory support in the setting of heart failure exacerbation. ? ?Assessment & Plan: ?  ?Principal Problem: ?  Acute respiratory failure with hypoxia ([place]) ?Active Problems: ?  DMII (diabetes mellitus, type 2) ([place]) ?  HTN (hypertension) ?  CORONARY ATHEROSCLEROSIS NATIVE CORONARY ARTERY ?  Diastolic congestive heart failure ([place]) ?  Acute clinical systolic heart failure ([place]) ?  Sacral decubitus ulcer, stage II ([place]) ? ?Acute hypoxic respiratory failure in the setting of combined systolic/diastolic heart failure exacerbation, POA ?-Patient demonstrating improvement in his respiratory status after diuresis; currently coming off BiPAP. ?-Continue oxygen supplementation and wean off as tolerated. ?-Appreciate cardiology consultation, assistance and recommendations.   ?-Still complaining of orthopnea and short winded sensation with minimal activity.  Patient also with trace edema ?-Blood pressure has remained low overnight with MAP less than 65 at that ended requiring the use of Levophed temporarily.   ?-Following cardiology recommendations patient has been started on midodrine and dose currently adjusted.   ?-Continue to follow blood pressure and wean off pressors as tolerated.  . ?-No further diuresis given  worsening renal function. ?-Overall prognosis is very poor and lengthy discussion with family at bedside assisting today.  Plan is to treat without heroic measures or invasive therapy. ?-If patient failed to respond will transition to comfort care. ? ?D-dimer elevated ?-Concern for underlying pulmonary embolism however patient had recent CTA within the last 2 months with negative findings, cannot repeat CTA now given elevated creatinine as below; VQ also limited value at this time given bilateral effusions. ?-Discussed possibly treating patient for presumed PE however he does have some fall risk given his age.  We will hold off on anticoagulation and follow diuresis as above, discussed with family that should patient's respiratory symptoms continue despite appropriate diuresis and volume control his symptoms may be related to underlying PE. ? ?AKI without history of CKD ?-In the setting of above diuresis and decreased perfusion with acute on chronic CHF. ?-Continue to follow renal function trend ?-Creatinine has continued to worse and creatinine is currently 2.47  ?-Continue to hold diuretics. ?-Try to maintain adequate blood pressure. ? ?Essential hypertension  ?-Continue to be significantly hypotensive with overnight needs for pressure support. ?-Midodrine has been adjusted following cardiology recommendations and increase to 3 times a day.. ?-Continue to follow vital signs. ? ?History of CAD, stable -continue home medications including aspirin, statin, Plavix. ?-No chest pain currently. ? ?Non-insulin-dependent diabetes mellitus type 2   ?-diet controlled, sliding scale insulin hypoglycemic protocol while hospitalized. ?-Continue to follow CBGs. ? ?Sacral decubitus ulcer stage II, chronic, POA  ?-continue to follow recommendation by wound care service ?-Preventive measures and conservative positioning instructed. ?-No signs of superimposed infection appreciated. ? ?Aortic stenosis  ?-Continue to maintain adequate  BP control ?-Patient is not a candidate for surgical intervention ?-  Cardiology service recommendations for medical management. ?-Overall stable. ? ?Peripheral vascular disease  ?-Continue treatment with aspirin, Plavix and a statin ? ?Atrial tachycardia: ?-Unable to fully exclude atypical atrial flutter; CHA2DS2-VASc score above 6.  Patient is not a candidate for anticoagulation ?-Continue low-dose Lopressor as tolerated with his blood pressure and continue the use of amiodarone. ?-Continue aspirin and Plavix. ?-Goals of care discussion for advance care planning and further decision in the setting of impending decline despite aggressive intervention has been provided with patient and family. ?-Palliative care consultation in place to assist with transition of care. ?-Continue to treat was treatable without heroic measures or intervention at this point. ? ? ?DVT prophylaxis: Lovenox ? ?Code Status: DNR ?Family Communication: Wife and daughter at bedside  ? ?Status is: Inpatient ? ?Dispo: The patient is from: Home ?             Anticipated d/c is to: To be determined ?             Anticipated d/c date is: 48 hours ?             Patient currently not medically stable for discharge ? ?Consultants:  ?None ? ?Procedures:  ?None ? ?Antimicrobials:  ?None indicated ? ?Subjective: ?Overnight with a transient use of BiPAP; patient blood pressure continued to be significantly low and failing to respond to the use of midodrine and fluids.  Patient has expressed feeling tired of the situation and not really wanting heroic measures.  Lopressor dose has been adjusted to just once a day; midodrine has been maxed out and will continue to treat with intention to keep him comfortable.  Family updated at bedside. ? ?Vitals:  ? 05/28/21 1400 05/28/21 1500 05/28/21 1524 05/28/21 1600  ?BP: (!) 94/48 (!) 91/46  (!) 91/45  ?Pulse: 77 77  74  ?Resp: (!) 22 (!) 22  15  ?Temp:   97.9 ?F (36.6 ?C)   ?TempSrc:   Axillary   ?SpO2: 100% 100%   100%  ?Weight:      ?Height:      ? ? ?Intake/Output Summary (Last 24 hours) at 05/28/2021 1631 ?Last data filed at 05/28/2021 0800 ?Gross per 24 hour  ?Intake 220 ml  ?Output 150 ml  ?Net 70 ml  ? ?Filed Weights  ? 05/26/21 0500 05/27/21 0500 05/28/21 0627  ?Weight: 64.6 kg 65.6 kg 67.2 kg  ? ? ?Examination: ?General exam: Alert, awake, oriented x 3; chronically ill, frail and expressing short winded sensation with activity.  2 L nasal cannula supplementation in place and overnight required BiPAP. ?Respiratory system: Decreased breath sounds at the bases, no frank crackles, no wheezing, no rhonchi are appreciated.  While at rest no using accessory muscles or demonstrating labored breathing. ?Cardiovascular system: Rate controlled, no rubs, no gallops, positive systolic murmur, no JVD on exam. ?Gastrointestinal system: Abdomen is nondistended, soft and nontender. No organomegaly or masses felt. Normal bowel sounds heard. ?Central nervous system: Alert and oriented. No focal neurological deficits. ?Extremities: No cyanosis, clubbing or edema. ?Skin: Stage II-III sacral/coccyx pressure injury with serosanguineous drainage and clean dressings on examination; no surrounding erythema appreciated on exam.  Patient reporting significant pain in the area. ?Psychiatry: Judgement and insight appear normal. Mood & affect appropriate.  ? ? ?Data Reviewed: I have personally reviewed following labs and imaging studies ? ?CBC: ?Recent Labs  ?Lab 05/22/21 ?0610 05/23/21 ?0352 05/24/21 ?0415 05/25/21 ?4332 05/26/21 ?9518  ?WBC 6.2 5.6 5.2 5.1 5.3  ?HGB 10.2* 10.4* 10.2*  10.3* 10.1*  ?HCT 30.1* 31.0* 31.6* 31.5* 30.2*  ?MCV 94.7 96.0 97.2 96.3 96.8  ?PLT 112* 111* 120* 114* 113*  ? ?Basic Metabolic Panel: ?Recent Labs  ?Lab 05/23/21 ?0352 05/24/21 ?0415 05/25/21 ?7893 05/26/21 ?8101 05/28/21 ?0401  ?NA 134* 134* 133* 134* 133*  ?K 5.2* 5.1 4.6 5.1 5.0  ?CL 104 104 104 104 103  ?CO2 '22 22 22 23 23  '$ ?GLUCOSE 163* 190* 154* 142* 128*   ?BUN 58* 68* 75* 80* 92*  ?CREATININE 2.13* 2.27* 2.36* 2.47* 2.68*  ?CALCIUM 9.1 9.3 9.1 9.2 8.8*  ? ?GFR: ?Estimated Creatinine Clearance: 15.8 mL/min (A) (by C-G formula based on SCr of 2.68 mg/dL (H))

## 2021-05-28 NOTE — Progress Notes (Signed)
Patient requested to be placed on bipap; O2 sat 98-100%, RR 28.  ?Bipap on.  ?

## 2021-05-28 NOTE — Progress Notes (Signed)
Dressing to pressure ulcer changed; wound malodorous, with minimal- moderate amount of seropurulent drainage. Patient remains afebrile and hypotensive. ? ?Patient assisted to turn completely to left side- patient refused; strongly encouraged; continued to decline. Repositioned back to supine with support pillows.  ?

## 2021-05-28 NOTE — Progress Notes (Signed)
Patient requested bipap on and off throughout the  night; c/o generalized pain, MD aware. Patient remains hypotensive almost throughout the shift, MD updated frequently. ? ?Patient received 27m of NS bolus x 1, midodrine 5ng x 1 this shift. Repositioned every 2 hours; although patient declined to offload buttocks/sacrum.  ?

## 2021-05-29 DIAGNOSIS — I471 Supraventricular tachycardia: Secondary | ICD-10-CM | POA: Diagnosis not present

## 2021-05-29 DIAGNOSIS — I5020 Unspecified systolic (congestive) heart failure: Secondary | ICD-10-CM | POA: Diagnosis not present

## 2021-05-29 LAB — BASIC METABOLIC PANEL
Anion gap: 8 (ref 5–15)
BUN: 100 mg/dL — ABNORMAL HIGH (ref 8–23)
CO2: 20 mmol/L — ABNORMAL LOW (ref 22–32)
Calcium: 9 mg/dL (ref 8.9–10.3)
Chloride: 104 mmol/L (ref 98–111)
Creatinine, Ser: 2.91 mg/dL — ABNORMAL HIGH (ref 0.61–1.24)
GFR, Estimated: 19 mL/min — ABNORMAL LOW (ref >60)
Glucose, Bld: 130 mg/dL — ABNORMAL HIGH (ref 70–99)
Potassium: 5.1 mmol/L (ref 3.5–5.1)
Sodium: 132 mmol/L — ABNORMAL LOW (ref 135–145)

## 2021-05-29 MED ORDER — ENSURE ENLIVE PO LIQD
237.0000 mL | Freq: Two times a day (BID) | ORAL | Status: DC
  Administered 2021-05-31: 237 mL via ORAL

## 2021-05-29 MED ORDER — ADULT MULTIVITAMIN W/MINERALS CH
1.0000 | ORAL_TABLET | Freq: Every day | ORAL | Status: DC
  Administered 2021-05-29 – 2021-05-31 (×3): 1 via ORAL
  Filled 2021-05-29 (×3): qty 1

## 2021-05-29 NOTE — Progress Notes (Signed)
?PROGRESS NOTE ? ? ? ?Charles Lester  RWE:[id number] DOB: 03-Jan-1925 DOA: 05/20/2021 ?PCP: [name], MD ? ? ?Brief Narrative:  ?Charles Lester is a 88 y.o. male with medical history significant of coronary artery disease with cardiomyopathy and heart failure with reduced EF of 30 to 35% on echo on 2/23 and moderate aortic stenosis, peripheral artery disease, type 2 diabetes that is well controlled with diet.   ? ?Patient presents with 1 week of worsening shortness of breath sputum production orthopnea and dyspnea with exertion.  Noted to be markedly hypoxic in the 70s at intake requiring nonrebreather and subsequently BiPAP for respiratory support.  Patient admitted for close monitoring, aggressive diuresis and respiratory support in the setting of heart failure exacerbation. ? ?Assessment & Plan: ?  ?Principal Problem: ?  Acute respiratory failure with hypoxia ([place]) ?Active Problems: ?  DMII (diabetes mellitus, type 2) ([place]) ?  HTN (hypertension) ?  CORONARY ATHEROSCLEROSIS NATIVE CORONARY ARTERY ?  Diastolic congestive heart failure ([place]) ?  Acute clinical systolic heart failure ([place]) ?  Sacral decubitus ulcer, stage II ([place]) ? ?Acute hypoxic respiratory failure in the setting of combined systolic/diastolic heart failure exacerbation, POA ?-Patient demonstrating improvement in his respiratory status after diuresis; currently coming off BiPAP. ?-Continue oxygen supplementation and wean off as tolerated. ?-Appreciate cardiology consultation, assistance and recommendations.   ?-Still complaining of orthopnea and short winded sensation with minimal activity.  Patient also with trace edema ?-Blood pressure has remained low overnight with MAP less than 65 at that ended requiring the use of Levophed temporarily.   ?-Following cardiology recommendations patient has been started on midodrine and dose currently adjusted.   ?-Continue to follow blood pressure and wean off pressors as tolerated.  . ?-No further diuresis given  worsening renal function. ?-Overall prognosis is very poor; patient asking to be transferred to [facility]; unfortunately no beds available at the receiving facility given the fact that medical necessities can be provided here. ?-Family interested to discuss with cardiology, social worker and palliative care about further treatment options. ? ?D-dimer elevated ?-Concern for underlying pulmonary embolism however patient had recent CTA within the last 2 months with negative findings, cannot repeat CTA now given elevated creatinine as below; VQ also limited value at this time given bilateral effusions. ?-Discussed possibly treating patient for presumed PE however he does have some fall risk given his age.  We will hold off on anticoagulation and follow diuresis as above, discussed with family that should patient's respiratory symptoms continue despite appropriate diuresis and volume control his symptoms may be related to underlying PE. ? ?AKI without history of CKD ?-In the setting of above diuresis and decreased perfusion with acute on chronic CHF. ?-Continue to follow renal function trend ?-Creatinine has continued to worse and creatinine is currently 2.68  ?-Continue to hold diuretics. ?-Try to maintain adequate blood pressure. ?-Taking adequate hydration. ? ?Essential hypertension  ?-Continue to be significantly hypotensive with overnight needs for pressure support. ?-Midodrine has been adjusted following cardiology recommendations and increase to 3 times a day.. ?-Continue to follow vital signs. ? ?History of CAD, stable -continue home medications including aspirin, statin, Plavix. ?-No chest pain currently. ? ?Non-insulin-dependent diabetes mellitus type 2   ?-diet controlled, sliding scale insulin hypoglycemic protocol while hospitalized. ?-Continue to follow CBGs. ? ?Sacral decubitus ulcer stage II, chronic, POA  ?-continue to follow recommendation by wound care service ?-Preventive measures and conservative  positioning instructed. ?-No signs of superimposed infection appreciated. ? ?Aortic stenosis  ?-Continue  to maintain adequate BP control ?-Patient is not a candidate for surgical intervention ?-Cardiology service recommendations for medical management. ?-Overall stable. ? ?Peripheral vascular disease  ?-Continue treatment with aspirin, Plavix and a statin ? ?Atrial tachycardia: ?-Unable to fully exclude atypical atrial flutter; CHA2DS2-VASc score above 6.  Patient is not a candidate for anticoagulation ?-Continue low-dose Lopressor as tolerated with his blood pressure and continue the use of amiodarone. ?-Continue aspirin and Plavix. ?-Goals of care discussion for advance care planning and further decision in the setting of impending decline despite aggressive intervention has been provided with patient and family. ?-Palliative care consultation in place to assist with transition of care. ?-Continue to treat was treatable without heroic measures or intervention at this point. ? ? ?DVT prophylaxis: Lovenox ? ?Code Status: DNR ?Family Communication: Wife and daughter at bedside  ? ?Status is: Inpatient ? ?Dispo: The patient is from: Home ?             Anticipated d/c is to: To be determined ?             Anticipated d/c date is: 48 hours ?             Patient currently not medically stable for discharge ? ?Consultants:  ?None ? ?Procedures:  ?None ? ?Antimicrobials:  ?None indicated ? ?Subjective: ?No chest pain, no nausea, no vomiting.  Expressing that he would like to be transferred to [facility]; improvement in his blood pressure and heart rate appreciated with recent adjusted medications; not requiring pressors.  Still needing 2 L nasal cannula supplementation and has required intermittent usage of BiPAP due to ongoing short winded sensation especially with activity. ? ?Vitals:  ? 05/29/21 1115 05/29/21 1200 05/29/21 1600 05/29/21 1630  ?BP: (!) 111/56 114/77  100/63  ?Pulse:    85  ?Resp: (!) 23 (!) 23  (!)  21  ?Temp: (!) 97.3 ?F (36.3 ?C)  97.9 ?F (36.6 ?C)   ?TempSrc: Oral  Oral   ?SpO2: 98% 98%  98%  ?Weight:      ?Height:      ? ? ?Intake/Output Summary (Last 24 hours) at 05/29/2021 1651 ?Last data filed at 05/29/2021 1200 ?Gross per 24 hour  ?Intake 614.67 ml  ?Output --  ?Net 614.67 ml  ? ?Filed Weights  ? 05/27/21 0500 05/28/21 0627 05/29/21 0314  ?Weight: 65.6 kg 67.2 kg 65.6 kg  ? ? ?Examination: ?General exam: Alert, awake, oriented x 3; no chest pain and good oxygen saturation on 2 L nasal cannula supplementation.  Patient continue reporting short winded sensation with activity and overnight transient use of BiPAP. ?Respiratory system: Fair air movement bilaterally, no frank crackles, no wheezing, no using accessory muscles at rest.  2 L nasal cannula supplementation in place.  Desaturation screening demonstrating drop of his oxygen saturation into the 70s on room air. ?Cardiovascular system: Rate controlled, no rubs, no gallops, positive systolic murmur, no JVD and examined. ?Gastrointestinal system: Abdomen is nondistended, soft and nontender. No organomegaly or masses felt. Normal bowel sounds heard. ?Central nervous system: Alert and oriented. No focal neurological deficits. ?Extremities: No cyanosis, clubbing or edema. ?Skin: No petechiae; stage II-III sacral/coccyx pressure injury with serosanguineous drainage appreciated.  Patient reporting significant pain. ?Psychiatry: Judgement and insight appear normal.  Flat affect appreciated. ? ? ?Data Reviewed: I have personally reviewed following labs and imaging studies ? ?CBC: ?Recent Labs  ?Lab 05/23/21 ?0352 05/24/21 ?0415 05/25/21 ?6045 05/26/21 ?4098  ?WBC 5.6 5.2 5.1 5.3  ?HGB  10.4* 10.2* 10.3* 10.1*  ?HCT 31.0* 31.6* 31.5* 30.2*  ?MCV 96.0 97.2 96.3 96.8  ?PLT 111* 120* 114* 113*  ? ?Basic Metabolic Panel: ?Recent Labs  ?Lab 05/24/21 ?0415 05/25/21 ?2909 05/26/21 ?0301 05/28/21 ?0401 05/29/21 ?0356  ?NA 134* 133* 134* 133* 132*  ?K 5.1 4.6 5.1 5.0 5.1   ?CL 104 104 104 103 104  ?CO2 '22 22 23 23 '$ 20*  ?GLUCOSE 190* 154* 142* 128* 130*  ?BUN 68* 75* 80* 92* 100*  ?CREATININE 2.27* 2.36* 2.47* 2.68* 2.91*  ?CALCIUM 9.3 9.1 9.2 8.8* 9.0  ? ?GFR: ?Estimated C

## 2021-05-29 NOTE — Progress Notes (Signed)
Initial Nutrition Assessment ? ?DOCUMENTATION CODES:  ? ?Not applicable ? ?INTERVENTION:  ? ?Offer Ensure Enlive po BID, each supplement provides 350 kcal and 20 grams of protein. ? ?Liberalize diet to regular.  ? ?MVI with minerals daily. ? ?NUTRITION DIAGNOSIS:  ? ?Increased nutrient needs related to wound healing as evidenced by estimated needs. ? ?GOAL:  ? ?Patient will meet greater than or equal to 90% of their needs ? ?MONITOR:  ? ?PO intake, Supplement acceptance, Skin ? ?REASON FOR ASSESSMENT:  ? ?Malnutrition Screening Tool ?  ? ?ASSESSMENT:  ? ?88 yo male admitted with worsening SOB r/t HF exacerbation. PMH includes CAD, cardiomyopathy, CHF, PAD, DM-2, HTN, sacral decubitus ulcer, stage II. ? ?RD working remotely. ?Unable to speak with patient or complete NFPE. ?Chart reviewed.  ?Noted overall prognosis is very poor with plans to treat without heroic measures or invasive therapy. May transition to comfort care if patient fails to respond to treatment. Palliative care team has been consulted to assist with transition of care.  ? ?Weight history reviewed.  ?Weight has been overall stable for the past 7 months.  ?Suspect weight fluctuates with fluids, given hx of CHF.  ?  ?Currently on a heart healthy carbohydrate modified diet. Meal intakes documented at 0-50%. ?Patient has been requiring BiPAP at times.  ?Suspect difficulty breathing is contributing to poor PO intake.  ? ?Labs reviewed. Na 132 ?Medications reviewed and include Flomax. ? ?NUTRITION - FOCUSED PHYSICAL EXAM: ? ?Unable to complete ? ?Diet Order:   ?Diet Order   ? ?       ?  Diet heart healthy/carb modified Room service appropriate? Yes; Fluid consistency: Thin  Diet effective now       ?  ? ?  ?  ? ?  ? ? ?EDUCATION NEEDS:  ? ?No education needs have been identified at this time ? ?Skin:  Skin Assessment: Skin Integrity Issues: ?Skin Integrity Issues:: Stage II ?Stage II: coccyx ? ?Last BM:  4/30 type 6 ? ?Height:  ? ?Ht Readings from Last 1  Encounters:  ?05/20/21 '5\' 7"'$  (1.702 m)  ? ? ?Weight:  ? ?Wt Readings from Last 1 Encounters:  ?05/29/21 65.6 kg  ? ? ? ?BMI:  Body mass index is 22.65 kg/m?. ? ?Estimated Nutritional Needs:  ? ?Kcal:  1800-2000 ? ?Protein:  85-100 gm ? ?Fluid:  1.8-2 L ? ? ? ?Lucas Mallow RD, LDN, CNSC ?Please refer to Amion for contact information.                                                       ? ?

## 2021-05-29 NOTE — Progress Notes (Signed)
Pt's daughter [name] [phone] would like to speak with Case Management regarding possible services for patient (home health). Message left for weekend Case Manager, Valarie Merino. Kassia Demarinis Tzvi Economou,RN ?

## 2021-05-29 NOTE — Progress Notes (Signed)
Patient's daughter advised patient wishes to go to [facility].   Messaged MD Dyann Kief to inform of patient request.   ?

## 2021-05-29 NOTE — Progress Notes (Signed)
SATURATION QUALIFICATIONS: (This note is used to comply with regulatory documentation for home oxygen) ? ?Patient Saturations on Room Air at Rest = 98% ? ?Patient Saturations on Room Air while Ambulating = 70% ? ?Patient Saturations on 2 Liters of oxygen while Ambulating = 98% ? ?Please briefly explain why patient needs home oxygen:  Patient has CHF and persistent shortness of breath at rest and with exertion.   ?

## 2021-05-30 ENCOUNTER — Inpatient Hospital Stay (HOSPITAL_COMMUNITY): Payer: Medicare Other

## 2021-05-30 ENCOUNTER — Encounter (HOSPITAL_COMMUNITY): Payer: Self-pay | Admitting: Primary Care

## 2021-05-30 DIAGNOSIS — Z7189 Other specified counseling: Secondary | ICD-10-CM | POA: Diagnosis not present

## 2021-05-30 DIAGNOSIS — I5020 Unspecified systolic (congestive) heart failure: Secondary | ICD-10-CM | POA: Diagnosis not present

## 2021-05-30 DIAGNOSIS — I5043 Acute on chronic combined systolic (congestive) and diastolic (congestive) heart failure: Secondary | ICD-10-CM

## 2021-05-30 DIAGNOSIS — J9601 Acute respiratory failure with hypoxia: Secondary | ICD-10-CM | POA: Diagnosis not present

## 2021-05-30 DIAGNOSIS — Z515 Encounter for palliative care: Secondary | ICD-10-CM

## 2021-05-30 DIAGNOSIS — R0609 Other forms of dyspnea: Secondary | ICD-10-CM | POA: Diagnosis not present

## 2021-05-30 DIAGNOSIS — I471 Supraventricular tachycardia: Secondary | ICD-10-CM | POA: Diagnosis not present

## 2021-05-30 LAB — ECHOCARDIOGRAM COMPLETE
AR max vel: 1.13 cm2
AV Area VTI: 1.14 cm2
AV Area mean vel: 1.13 cm2
AV Mean grad: 7.7 mmHg
AV Peak grad: 15.6 mmHg
AV Vena cont: 0.5 cm
Ao pk vel: 1.98 m/s
Area-P 1/2: 1.97 cm2
Calc EF: 38 %
Height: 67 in
MV M vel: 3.77 m/s
MV Peak grad: 56.9 mmHg
P 1/2 time: 262 msec
Radius: 0.7 cm
S' Lateral: 4.7 cm
Single Plane A2C EF: 51.3 %
Single Plane A4C EF: 19.1 %
Weight: 2345.69 oz

## 2021-05-30 LAB — BASIC METABOLIC PANEL
Anion gap: 11 (ref 5–15)
BUN: 108 mg/dL — ABNORMAL HIGH (ref 8–23)
CO2: 18 mmol/L — ABNORMAL LOW (ref 22–32)
Calcium: 9.1 mg/dL (ref 8.9–10.3)
Chloride: 102 mmol/L (ref 98–111)
Creatinine, Ser: 3.17 mg/dL — ABNORMAL HIGH (ref 0.61–1.24)
GFR, Estimated: 17 mL/min — ABNORMAL LOW (ref >60)
Glucose, Bld: 196 mg/dL — ABNORMAL HIGH (ref 70–99)
Potassium: 5.6 mmol/L — ABNORMAL HIGH (ref 3.5–5.1)
Sodium: 131 mmol/L — ABNORMAL LOW (ref 135–145)

## 2021-05-30 LAB — HEPATIC FUNCTION PANEL
ALT: 53 U/L — ABNORMAL HIGH (ref 0–44)
AST: 54 U/L — ABNORMAL HIGH (ref 15–41)
Albumin: 2.8 g/dL — ABNORMAL LOW (ref 3.5–5.0)
Alkaline Phosphatase: 137 U/L — ABNORMAL HIGH (ref 38–126)
Bilirubin, Direct: 0.5 mg/dL — ABNORMAL HIGH (ref 0.0–0.2)
Indirect Bilirubin: 0.8 mg/dL (ref 0.3–0.9)
Total Bilirubin: 1.3 mg/dL — ABNORMAL HIGH (ref 0.3–1.2)
Total Protein: 6.2 g/dL — ABNORMAL LOW (ref 6.5–8.1)

## 2021-05-30 LAB — BRAIN NATRIURETIC PEPTIDE: B Natriuretic Peptide: 1497 pg/mL — ABNORMAL HIGH (ref 0.0–100.0)

## 2021-05-30 LAB — LACTIC ACID, PLASMA
Lactic Acid, Venous: 2.5 mmol/L (ref 0.5–1.9)
Lactic Acid, Venous: 2.5 mmol/L (ref 0.5–1.9)

## 2021-05-30 MED ORDER — MORPHINE SULFATE (PF) 2 MG/ML IV SOLN
2.0000 mg | Freq: Once | INTRAVENOUS | Status: AC
  Administered 2021-05-30: 2 mg via INTRAVENOUS
  Filled 2021-05-30: qty 1

## 2021-05-30 MED ORDER — HYDROMORPHONE HCL 1 MG/ML IJ SOLN
0.5000 mg | Freq: Three times a day (TID) | INTRAMUSCULAR | Status: DC | PRN
  Administered 2021-05-30: 0.5 mg via INTRAVENOUS
  Filled 2021-05-30: qty 0.5

## 2021-05-30 NOTE — Consult Note (Signed)
? ?                                                                                ?Consultation Note ?Date: 05/30/2021  ? ?Patient Name: Charles Lester  ?DOB: 1924-05-08  MRN: [id number]  Age / Sex: 88 y.o., male  ?PCP: [name], MD ?Referring Physician: Barton Dubois, MD ? ?Reason for Consultation: Establishing goals of care ? ?HPI/Patient Profile: 88 y.o. male  with past medical history of coronary artery disease with cardiomyopathy, heart failure with reduced EF of 30 to 35% on echo February 2023, moderate aortic stenosis, peripheral artery disease, DM2 well-controlled with diet, HTN, arthritis, admitted on 05/20/2021 with A. tach versus possible a flutter, acute on chronic heart failure with reduced EF.  ? ?Clinical Assessment and Goals of Care: ?I have reviewed medical records including EPIC notes, labs and imaging, received report from RN, assessed the patient.  Charles Lester is lying quietly in bed.  He appears acutely/chronically ill and quite frail, thin and cachectic.  He greets me, making and somewhat keeping eye contact.  He is alert and oriented x3, able to make his basic needs known.  His wife of 12 years and his son Charles Lester, are at bedside.  I introduced Palliative Medicine as specialized medical care for people living with serious illness. It focuses on providing relief from the symptoms and stress of a serious illness. The goal is to improve quality of life for both the patient and the family. ? ?We discussed a brief life review of the patient.  Mr. Mrs. Lester has been married for 64 years.  They share 4 children.  Charles Lester works in the [employer] for many years.  He shares that he has been having weight loss, but unsure of the amount.  Family states that he is sitting more at home.  He has been able to complete his ADLs, but has help from family for IADLs.  He endorses decreased appetite. ? ?We then focused on their current illness.  We talk about further testing with cardiology.  We  talk about blood pressure and kidney function, fluid overload, heart rate.  The natural disease trajectory and expectations at EOL were discussed. ? ?Advanced directives, concepts specific to code status, artifical feeding and hydration, and rehospitalization were considered and discussed.  Charles Lester endorses treat the treatable, but allowing natural passing, no life support. ? ?Call to daughter/healthcare power of attorney, Charles Lester, to discuss diagnosis prognosis, [place], EOL wishes, disposition and options.   I introduced Palliative Medicine as specialized medical care for people living with serious illness. It focuses on providing relief from the symptoms and stress of a serious illness. The goal is to improve quality of life for both the patient and the family. ? ?We then focused on their current illness.  Overall, Charles Lester is able to accurately describe her father's acute illness and the treatment plan.  I share new/current test results of decreased EF 10 to 15%, reduced from 30 to 35% in February.  We talked about cardiogenic shock, decreasing kidney function, heart strain.  The natural disease trajectory and expectations at EOL were discussed. ? ?Hospice and Palliative Care  services outpatient were explained and offered with daughter/HC POA, Charles Lester.  We talked about at home hospice versus residential hospice.  Charles Lester shares that her father states that he would not want to go home and be a burden, but Charles Lester states that family is ready to care for him at home. ? ?We plan for family meeting 5/2 at 61.    ? ?Discussed the importance of continued conversation with family and the medical providers regarding overall plan of care and treatment options, ensuring decisions are within the context of the patient?s values and GOCs.  Questions and concerns were addressed.   The family was encouraged to call with questions or concerns.  PMT will continue to support holistically. ? ?Conference with attending, bedside nursing  staff, transition of care team related to patient condition, needs, goals of care, disposition. ? ? ?HCPOA ?HCPOA -daughter, Charles Lester. ?  ? ?SUMMARY OF RECOMMENDATIONS   ?At this point continue to treat the treatable but no CPR or intubation ?Family meeting 5/2 at 72 at bedside ?Discussed home with hospice with daughter, NOT patient yet ? ? ?Code Status/Advance Care Planning: ?DNR -verified with patient ? ?Symptom Management:  ?Per hospitalist, no additional needs at this time. ? ?Palliative Prophylaxis:  ?Frequent Pain Assessment, Oral Care, Palliative Wound Care, and Turn Reposition ? ?Additional Recommendations (Limitations, Scope, Preferences): ?At this point continue to treat the treatable but no CPR or intubation. ? ?Psycho-social/Spiritual:  ?Desire for further Chaplaincy support:no ?Additional Recommendations: Caregiving  Support/Resources ? ?Prognosis:  ?Unable to determine, guarded at this point.  3 months or less would not be surprising based on frailty, decreasing functional status. ? ?Discharge Planning:  To be determined, based on outcomes.  At this point Charles Lester states that he is agreeable to go to short-term rehab if he improves enough.   ? ?  ? ?Primary Diagnoses: ?Present on Admission: ? Acute respiratory failure with hypoxia ([place]) ? HTN (hypertension) ? CORONARY ATHEROSCLEROSIS NATIVE CORONARY ARTERY ? Diastolic congestive heart failure ([place]) ? Acute clinical systolic heart failure ([place]) ? Sacral decubitus ulcer, stage II ([place]) ? ? ?I have reviewed the medical record, interviewed the patient and family, and examined the patient. The following aspects are pertinent. ? ?Past Medical History:  ?Diagnosis Date  ? Aortic stenosis   ? a. Mild to moderate 2017 b. echo in 03/2021 showing moderate AS and moderate to severe AI  ? Arthritis   ? CAD (coronary artery disease)   ? Cardiomyopathy ([place])   ? a. EF 30-35% by echo in 03/2020. Patient declined further work-up in the setting of his advanced  age.  ? Essential hypertension   ? Hyperlipidemia   ? Ischemic heart disease   ? Based on Myoview 2010 revealing inferolateral scar but no active ischemia  ? PAD (peripheral artery disease) ([place])   ? Type 2 diabetes mellitus ([place])   ? ?Social History  ? ?Socioeconomic History  ? Marital status: Married  ?  Spouse name: Not on file  ? Number of children: Not on file  ? Years of education: Not on file  ? Highest education level: Not on file  ?Occupational History  ? Occupation: Retired  ?Tobacco Use  ? Smoking status: Former  ?  Types: Cigarettes  ?  Quit date: 02/10/2005  ?  Years since quitting: 16.3  ? Smokeless tobacco: Never  ?Vaping Use  ? Vaping Use: Never used  ?Substance and Sexual Activity  ? Alcohol use: No  ? Drug use: No  ?  Sexual activity: Not Currently  ?  Partners: Female  ?Other Topics Concern  ? Not on file  ?Social History Narrative  ? No regular exercise  ? ?Social Determinants of Health  ? ?Financial Resource Strain: Not on file  ?Food Insecurity: Not on file  ?Transportation Needs: Not on file  ?Physical Activity: Not on file  ?Stress: Not on file  ?Social Connections: Not on file  ? ?Family History  ?Problem Relation Age of Onset  ? Hypertension Father   ? Hypertension Sister   ? Hypertension Brother   ? Coronary artery disease Neg Hx   ? ?Scheduled Meds: ? amiodarone  200 mg Oral BID  ? aspirin EC  81 mg Oral Daily  ? atorvastatin  20 mg Oral Daily  ? Chlorhexidine Gluconate Cloth  6 each Topical Daily  ? clopidogrel  75 mg Oral Daily  ? enoxaparin (LOVENOX) injection  30 mg Subcutaneous Q24H  ? feeding supplement  237 mL Oral BID BM  ? finasteride  5 mg Oral Daily  ? mouth rinse  15 mL Mouth Rinse BID  ? metoprolol succinate  12.5 mg Oral Daily  ? midodrine  5 mg Oral TID WC  ? multivitamin with minerals  1 tablet Oral Daily  ? tamsulosin  0.4 mg Oral QPC supper  ? ?Continuous Infusions: ? sodium chloride Stopped (05/29/21 1200)  ? sodium chloride Stopped (05/28/21 0459)  ? ?PRN  Meds:.guaiFENesin-dextromethorphan, HYDROmorphone (DILAUDID) injection, ipratropium-albuterol, ondansetron **OR** ondansetron (ZOFRAN) IV ?Medications Prior to Admission:  ?Prior to Admission medications   ?Medicat

## 2021-05-30 NOTE — Progress Notes (Signed)
Pt resting BIPAP not needed at this time ?

## 2021-05-30 NOTE — Progress Notes (Signed)
?PROGRESS NOTE ? ? ? ?PADEN SENGER  QMG:[id number] DOB: February 20, 1924 DOA: 05/20/2021 ?PCP: [name], MD ? ? ?Brief Narrative:  ?LATHANIEL LEGATE is a 88 y.o. male with medical history significant of coronary artery disease with cardiomyopathy and heart failure with reduced EF of 30 to 35% on echo on 2/23 and moderate aortic stenosis, peripheral artery disease, type 2 diabetes that is well controlled with diet.   ? ?Patient presents with 1 week of worsening shortness of breath sputum production orthopnea and dyspnea with exertion.  Noted to be markedly hypoxic in the 70s at intake requiring nonrebreather and subsequently BiPAP for respiratory support.  Patient admitted for close monitoring, aggressive diuresis and respiratory support in the setting of heart failure exacerbation. ? ?Assessment & Plan: ?  ?Principal Problem: ?  Acute respiratory failure with hypoxia ([place]) ?Active Problems: ?  DMII (diabetes mellitus, type 2) ([place]) ?  HTN (hypertension) ?  CORONARY ATHEROSCLEROSIS NATIVE CORONARY ARTERY ?  Diastolic congestive heart failure ([place]) ?  Acute clinical systolic heart failure ([place]) ?  Sacral decubitus ulcer, stage II ([place]) ? ?Acute hypoxic respiratory failure in the setting of combined systolic/diastolic heart failure exacerbation, POA ?-Patient demonstrating improvement in his respiratory status after diuresis; currently coming off BiPAP. ?-Continue oxygen supplementation and wean off as tolerated. ?-Appreciate cardiology consultation, assistance and recommendations.   ?-Still complaining of orthopnea and short winded sensation with minimal activity.  Patient also with trace edema ?-Blood pressure has remained low overnight with MAP less than 65 at that ended requiring the use of Levophed temporarily.   ?-Following cardiology recommendations patient has been started on midodrine and dose currently adjusted.   ?-Continue to follow blood pressure and wean off pressors as tolerated.  . ?-No further diuresis given  worsening renal function. ?-Overall prognosis is very poor;  ?-Appreciate cardiology service and assistance; 2D echo demonstrating worsening inpatient ejection fraction down to 10-15% given concern for cardiogenic shock presentation. ?-Not a candidate for invasive intervention. ?-Family family meeting planned for 5-23 around 10 AM in the morning with palliative care and cardiology service.  Will follow outcome.  Patient is definitely a candidate for hospice. ? ?D-dimer elevated ?-Concern for underlying pulmonary embolism however patient had recent CTA within the last 2 months with negative findings, cannot repeat CTA now given elevated creatinine as below; VQ also limited value at this time given bilateral effusions. ?-Discussed possibly treating patient for presumed PE however he does have some fall risk given his age.  We will hold off on anticoagulation and follow diuresis as above, discussed with family that should patient's respiratory symptoms continue despite appropriate diuresis and volume control his symptoms may be related to underlying PE. ? ?AKI without history of CKD ?-In the setting of above diuresis and decreased perfusion with acute on chronic CHF. ?-Continue to follow renal function trend ?-Creatinine has continued to worse and creatinine is currently 3.17; with a BUN of 108 ?-Continue to hold diuretics. ?-Try to maintain adequate blood pressure. ?-Continue to maintain adequate hydration ?-Patient is not a candidate for dialysis; very limited options and at this moment demonstrating cardiogenic shock with poor perfusion to his kidneys. ?-Lactic acid 2.5 ? ?Essential hypertension  ?-Continue to be significantly hypotensive with overnight needs for pressure support. ?-Midodrine has been adjusted following cardiology recommendations and increase to 3 times a day.. ?-Continue to follow vital signs. ? ?History of CAD, stable -continue home medications including aspirin, statin, Plavix. ?-No chest pain  currently. ? ?Non-insulin-dependent diabetes mellitus type  2   ?-diet controlled, sliding scale insulin hypoglycemic protocol while hospitalized. ?-Continue to follow CBGs. ? ?Sacral decubitus ulcer stage II-III, chronic, POA  ?-continue to follow recommendation by wound care service ?-Preventive measures and conservative positioning instructed. ?-No signs of superimposed infection appreciated; but demonstrating serosanguineous drainage.. ? ?Aortic stenosis  ?-Continue to maintain adequate BP control ?-Cardiology service recommendations for medical management. ?-Overall stable. ?-Patient is not a candidate for surgical intervention. ?-Overall limited options and very poor prognosis. ? ?Peripheral vascular disease  ?-Continue treatment with aspirin, Plavix and a statin ? ?Atrial tachycardia: ?-Unable to fully exclude atypical atrial flutter; CHA2DS2-VASc score above 6.  Patient is not a candidate for anticoagulation. ?-Continue low-dose Lopressor as tolerated with his blood pressure and continue the use of amiodarone. ?-Continue aspirin and Plavix. ?-Goals of care discussion for advance care planning and further decision in the setting of impending decline despite aggressive intervention has been provided with patient and family. ?-Palliative care consultation in place to assist with transition of care. ?-Continue to treat was treatable without heroic measures or intervention at this point. ? ? ?DVT prophylaxis: Lovenox ? ?Code Status: DNR ?Family Communication: Wife and daughter at bedside  ? ?Status is: Inpatient ? ?Dispo: The patient is from: Home ?             Anticipated d/c is to: To be determined ?             Anticipated d/c date is: 48 hours ?             Patient currently not medically stable for discharge ? ?Consultants:  ?None ? ?Procedures:  ?None ? ?Antimicrobials:  ?None indicated ? ?Subjective: ?No chest pain, no nausea, no vomiting; continues to be short winded with activity and expressing no feeling  good. ? ?Vitals:  ? 05/30/21 1500 05/30/21 1600 05/30/21 1640 05/30/21 1700  ?BP: (!) 115/51 105/69  (!) 117/53  ?Pulse: 83 82 75 83  ?Resp: (!) 25 (!) 24 (!) 21 (!) 26  ?Temp:   97.6 ?F (36.4 ?C)   ?TempSrc:   Axillary   ?SpO2: 98% 98% 98% 98%  ?Weight:      ?Height:      ? ? ?Intake/Output Summary (Last 24 hours) at 05/30/2021 1746 ?Last data filed at 05/30/2021 1700 ?Gross per 24 hour  ?Intake 410 ml  ?Output --  ?Net 410 ml  ? ?Filed Weights  ? 05/28/21 0627 05/29/21 0314 05/30/21 0500  ?Weight: 67.2 kg 65.6 kg 66.5 kg  ? ? ?Examination: ?General exam: Alert, awake, oriented x 3; patient continue reporting short winded sensation with minimal assistance and is complaining of pain in his pressure injury.  Reports no palpitations currently. ?Respiratory system: Decreased breath sounds at the bases, no frank crackles, no wheezing; 2 L of cannula in place. ?Cardiovascular system: Rate controlled currently; no rubs or gallops, positive systolic murmur.  No JVD on exam. ?Gastrointestinal system: Abdomen is nondistended, soft and nontender. No organomegaly or masses felt. Normal bowel sounds heard. ?Central nervous system: Alert and oriented. No focal neurological deficits. ?Extremities: No cyanosis or clubbing; trace pedal edema appreciated bilaterally. ?Skin: No petechiae; stage II-III pressure injury appreciated in his sacral area; positive serosanguineous drainage. ?Psychiatry: Judgement and insight appear normal.  Intermittently anxious.  Flat mood. ? ? ?Data Reviewed: I have personally reviewed following labs and imaging studies ? ?CBC: ?Recent Labs  ?Lab 05/24/21 ?0415 05/25/21 ?5188 05/26/21 ?4166  ?WBC 5.2 5.1 5.3  ?HGB 10.2* 10.3* 10.1*  ?  HCT 31.6* 31.5* 30.2*  ?MCV 97.2 96.3 96.8  ?PLT 120* 114* 113*  ? ?Basic Metabolic Panel: ?Recent Labs  ?Lab 05/25/21 ?4970 05/26/21 ?2637 05/28/21 ?0401 05/29/21 ?8588 05/30/21 ?0809  ?NA 133* 134* 133* 132* 131*  ?K 4.6 5.1 5.0 5.1 5.6*  ?CL 104 104 103 104 102  ?CO2 '22 23 23  '$ 20* 18*  ?GLUCOSE 154* 142* 128* 130* 196*  ?BUN 75* 80* 92* 100* 108*  ?CREATININE 2.36* 2.47* 2.68* 2.91* 3.17*  ?CALCIUM 9.1 9.2 8.8* 9.0 9.1  ? ?GFR: ?Estimated Creatinine Clearance: 13.3 mL/min (

## 2021-05-30 NOTE — Progress Notes (Addendum)
? ?Progress Note ? ?Patient Name: Charles Lester ?Date of Encounter: 05/30/2021 ? ?[facility] Cardiologist: Rozann Lesches, MD  ? ?Subjective  ? ?Ongoing SOB ? ?Inpatient Medications  ?  ?Scheduled Meds: ? amiodarone  200 mg Oral BID  ? aspirin EC  81 mg Oral Daily  ? atorvastatin  20 mg Oral Daily  ? Chlorhexidine Gluconate Cloth  6 each Topical Daily  ? clopidogrel  75 mg Oral Daily  ? enoxaparin (LOVENOX) injection  30 mg Subcutaneous Q24H  ? feeding supplement  237 mL Oral BID BM  ? finasteride  5 mg Oral Daily  ? mouth rinse  15 mL Mouth Rinse BID  ? metoprolol succinate  12.5 mg Oral Daily  ? midodrine  5 mg Oral TID WC  ? multivitamin with minerals  1 tablet Oral Daily  ? tamsulosin  0.4 mg Oral QPC supper  ? ?Continuous Infusions: ? sodium chloride Stopped (05/29/21 1200)  ? sodium chloride Stopped (05/28/21 0459)  ? ?PRN Meds: ?guaiFENesin-dextromethorphan, ipratropium-albuterol, ondansetron **OR** ondansetron (ZOFRAN) IV  ? ?Vital Signs  ?  ?Vitals:  ? 05/30/21 0600 05/30/21 0700 05/30/21 0734 05/30/21 0737  ?BP: (!) 100/54 (!) 111/59    ?Pulse: 85 77 (!) 49   ?Resp: 20 17 (!) 25   ?Temp:   97.6 ?F (36.4 ?C)   ?TempSrc:   Oral   ?SpO2: 100% 100% 97% 96%  ?Weight:      ?Height:      ? ? ?Intake/Output Summary (Last 24 hours) at 05/30/2021 0833 ?Last data filed at 05/29/2021 1800 ?Gross per 24 hour  ?Intake 410 ml  ?Output --  ?Net 410 ml  ? ? ?  05/30/2021  ?  5:00 AM 05/29/2021  ?  3:14 AM 05/28/2021  ?  6:27 AM  ?Last 3 Weights  ?Weight (lbs) 146 lb 9.7 oz 144 lb 10 oz 148 lb 2.4 oz  ?Weight (kg) 66.5 kg 65.6 kg 67.2 kg  ?   ? ?Telemetry  ?  ?SR, PACs, runs of atach - Personally Reviewed ? ?ECG  ?  ?N/a - Personally Reviewed ? ?Physical Exam  ? ?GEN: No acute distress.   ?Neck: +JVD ?Cardiac: RRR, no murmurs, rubs, or gallops.  ?Respiratory: Clear to auscultation bilaterally. ?GI: Soft, nontender, non-distended  ?MS: 1+ bilateral LE edema ?Neuro:  Nonfocal  ?Psych: Normal affect  ? ?Labs  ?  ?High  Sensitivity Troponin:  No results for input(s): TROPONINIHS in the last 720 hours.   ?Chemistry ?Recent Labs  ?Lab 05/26/21 ?0339 05/28/21 ?0401 05/29/21 ?0356  ?NA 134* 133* 132*  ?K 5.1 5.0 5.1  ?CL 104 103 104  ?CO2 23 23 20*  ?GLUCOSE 142* 128* 130*  ?BUN 80* 92* 100*  ?CREATININE 2.47* 2.68* 2.91*  ?CALCIUM 9.2 8.8* 9.0  ?GFRNONAA 24* 21* 19*  ?ANIONGAP '7 7 8  '$ ?  ?Lipids No results for input(s): CHOL, TRIG, HDL, LABVLDL, LDLCALC, CHOLHDL in the last 168 hours.  ?Hematology ?Recent Labs  ?Lab 05/24/21 ?0415 05/25/21 ?7741 05/26/21 ?2878  ?WBC 5.2 5.1 5.3  ?RBC 3.25* 3.27* 3.12*  ?HGB 10.2* 10.3* 10.1*  ?HCT 31.6* 31.5* 30.2*  ?MCV 97.2 96.3 96.8  ?MCH 31.4 31.5 32.4  ?MCHC 32.3 32.7 33.4  ?RDW 15.0 15.1 15.0  ?PLT 120* 114* 113*  ? ?Thyroid No results for input(s): TSH, FREET4 in the last 168 hours.  ?BNPNo results for input(s): BNP, PROBNP in the last 168 hours.  ?DDimer No results for input(s): DDIMER in the  last 168 hours.  ? ?Radiology  ?  ?DG CHEST PORT 1 VIEW ? ?Result Date: 05/30/2021 ?CLINICAL DATA:  Shortness of breath. Respiratory failure with hypoxia. EXAM: PORTABLE CHEST 1 VIEW COMPARISON:  05/23/2021 FINDINGS: Persistent cardiomegaly. Aortic atherosclerosis and tortuosity. Persistent moderate bilateral pleural effusions with dependent pulmonary atelectasis. Findings appear quite similar to the study of 1 week ago. IMPRESSION: Similar appearance with persistent moderate bilateral effusions and lower lobe atelectasis. Electronically Signed   By: Nelson Chimes M.D.   On: 05/30/2021 08:01   ? ?Cardiac Studies  ? ?Echocardiogram 03/06/2021: ? 1. Left ventricular ejection fraction, by estimation, is 30 to 35%. The  ?left ventricle has moderately decreased function. The left ventricle  ?demonstrates regional wall motion abnormalities (see scoring  ?diagram/findings for description). There is mild  ?left ventricular hypertrophy. Left ventricular diastolic parameters are  ?consistent with Grade I diastolic  dysfunction (impaired relaxation).  ?Elevated left ventricular end-diastolic pressure.  ? 2. Right ventricular systolic function is normal. The right ventricular  ?size is normal. There is normal pulmonary artery systolic pressure. The  ?estimated right ventricular systolic pressure is 52.8 mmHg.  ? 3. Left atrial size was moderately dilated.  ? 4. The mitral valve is degenerative. Mild mitral valve regurgitation.  ? 5. The aortic valve has an indeterminant number of cusps. There is  ?moderate calcification of the aortic valve. Aortic valve regurgitation is  ?moderate to severe. Moderate aortic valve stenosis with low gradient.  ?Aortic regurgitation PHT measures 210  ?msec. Aortic valve area, by VTI measures 1.37 cm?Marland Kitchen Aortic valve mean  ?gradient measures 13.0 mmHg. Dimentionless index 0.44.  ? 6. The inferior vena cava is normal in size with greater than 50%  ?respiratory variability, suggesting right atrial pressure of 3 mmHg.  ? ?Patient Profile  ?   ?Charles Lester is a 88 y.o. male with a hx of presumed CAD, aortic stenosis, who is being seen 05/23/2021 for the evaluation of CHF at the request of Dr. Avon Gully ? ?Assessment & Plan  ?  ?Atach ?-from notes atach vs possible aflutter this admission ?- not optimal anticoag candidate according to notes ?-on lopressor, oral amio ? ? ?2.Acute on Chronic HFrEF ?- 03/2021 echo LVE 30-35%, grade I dd,  ?- from notes has previously turned down further workup at time of drop in LVE 03/2021 ?- medical therapy limited by low bp's ? ?- admitted marked hypoxia, initially on bipap ?- diuresis limited by low bp's, AKI. Diuretics on hold. ?CXR with persistent moderate effusions, BNP pending today ?- repeat echo reassess LV function, valvular disease. Worry with bp's and progressing renal failure that his cardiac function may have further declined.  ? ? ?3. AKI on CKD ?- Cr up to 3.17 today ? ?4. Aortic stenosis/regurgitation ?-03/2021 by VTI measures 1.37 cm?Marland Kitchen Aortic valve mean  gradient measures 13.0 mmHg. Dimentionless index 0.44. Mod to severe AI ? ?5. Hypotension ?- on midodrine ? ?6. DNR ?- agree with palliative evaluation. ? ? ?100pm addendum ?Echo shows further decline in LVEF to 10-15%, mod low flow low gradient AS and moderate AI. Lactic acid elevated 2.5, BNP 1400, worsening renal function despite holding diuretics and low bp's requiring midodrine. He is in cardiogenic shock. He is not a candidate for invasive procedures or advanced therapies due to advance, comorbidities, fraility. Recommend ongoing talks with palliative and hospice considerations.  ? ?For questions or updates, please contact [place] ?Please consult [email or web address] for contact info under  ? ?  ?   ?  Signed, ?Carlyle Dolly, MD  ?05/30/2021, 8:33 AM   ? ?

## 2021-05-30 NOTE — Progress Notes (Signed)
Patient has not voided during shift, patient just received pain medication and is currently resting comfortably.   Requested NT not bladder scan patient at this time to allow patient to rest and have relief from discomfort.  Will report to night shift of need for bladder scan when patient awake.  ?

## 2021-05-31 ENCOUNTER — Inpatient Hospital Stay (HOSPITAL_COMMUNITY)
Admission: RE | Admit: 2021-05-31 | Discharge: 2021-06-01 | DRG: 951 | Disposition: A | Discharge status: Hospice | Attending: Internal Medicine | Admitting: Internal Medicine

## 2021-05-31 ENCOUNTER — Encounter (HOSPITAL_COMMUNITY): Payer: Self-pay | Admitting: Internal Medicine

## 2021-05-31 ENCOUNTER — Other Ambulatory Visit: Payer: Self-pay

## 2021-05-31 DIAGNOSIS — I2510 Atherosclerotic heart disease of native coronary artery without angina pectoris: Secondary | ICD-10-CM | POA: Diagnosis present

## 2021-05-31 DIAGNOSIS — I350 Nonrheumatic aortic (valve) stenosis: Secondary | ICD-10-CM | POA: Diagnosis present

## 2021-05-31 DIAGNOSIS — L89153 Pressure ulcer of sacral region, stage 3: Secondary | ICD-10-CM | POA: Diagnosis present

## 2021-05-31 DIAGNOSIS — J9601 Acute respiratory failure with hypoxia: Secondary | ICD-10-CM | POA: Diagnosis present

## 2021-05-31 DIAGNOSIS — M1990 Unspecified osteoarthritis, unspecified site: Secondary | ICD-10-CM | POA: Diagnosis present

## 2021-05-31 DIAGNOSIS — I5043 Acute on chronic combined systolic (congestive) and diastolic (congestive) heart failure: Secondary | ICD-10-CM | POA: Diagnosis present

## 2021-05-31 DIAGNOSIS — I959 Hypotension, unspecified: Secondary | ICD-10-CM | POA: Diagnosis present

## 2021-05-31 DIAGNOSIS — E782 Mixed hyperlipidemia: Secondary | ICD-10-CM | POA: Diagnosis present

## 2021-05-31 DIAGNOSIS — I5021 Acute systolic (congestive) heart failure: Secondary | ICD-10-CM | POA: Diagnosis present

## 2021-05-31 DIAGNOSIS — Z515 Encounter for palliative care: Secondary | ICD-10-CM | POA: Diagnosis present

## 2021-05-31 DIAGNOSIS — I110 Hypertensive heart disease with heart failure: Secondary | ICD-10-CM | POA: Diagnosis present

## 2021-05-31 DIAGNOSIS — Z66 Do not resuscitate: Secondary | ICD-10-CM | POA: Diagnosis present

## 2021-05-31 DIAGNOSIS — R570 Cardiogenic shock: Secondary | ICD-10-CM

## 2021-05-31 DIAGNOSIS — Z8249 Family history of ischemic heart disease and other diseases of the circulatory system: Secondary | ICD-10-CM

## 2021-05-31 DIAGNOSIS — Z7901 Long term (current) use of anticoagulants: Secondary | ICD-10-CM

## 2021-05-31 DIAGNOSIS — I471 Supraventricular tachycardia: Secondary | ICD-10-CM | POA: Diagnosis present

## 2021-05-31 DIAGNOSIS — Z79899 Other long term (current) drug therapy: Secondary | ICD-10-CM | POA: Diagnosis not present

## 2021-05-31 DIAGNOSIS — Z20822 Contact with and (suspected) exposure to covid-19: Secondary | ICD-10-CM | POA: Diagnosis present

## 2021-05-31 DIAGNOSIS — I10 Essential (primary) hypertension: Secondary | ICD-10-CM | POA: Diagnosis present

## 2021-05-31 DIAGNOSIS — L89152 Pressure ulcer of sacral region, stage 2: Secondary | ICD-10-CM | POA: Diagnosis not present

## 2021-05-31 DIAGNOSIS — I429 Cardiomyopathy, unspecified: Secondary | ICD-10-CM | POA: Diagnosis present

## 2021-05-31 DIAGNOSIS — Z7982 Long term (current) use of aspirin: Secondary | ICD-10-CM

## 2021-05-31 DIAGNOSIS — E1151 Type 2 diabetes mellitus with diabetic peripheral angiopathy without gangrene: Secondary | ICD-10-CM | POA: Diagnosis present

## 2021-05-31 DIAGNOSIS — Z86711 Personal history of pulmonary embolism: Secondary | ICD-10-CM

## 2021-05-31 DIAGNOSIS — I5023 Acute on chronic systolic (congestive) heart failure: Secondary | ICD-10-CM | POA: Diagnosis not present

## 2021-05-31 DIAGNOSIS — Z87891 Personal history of nicotine dependence: Secondary | ICD-10-CM | POA: Diagnosis not present

## 2021-05-31 DIAGNOSIS — N400 Enlarged prostate without lower urinary tract symptoms: Secondary | ICD-10-CM | POA: Diagnosis present

## 2021-05-31 DIAGNOSIS — Z7189 Other specified counseling: Secondary | ICD-10-CM | POA: Diagnosis not present

## 2021-05-31 DIAGNOSIS — N1832 Chronic kidney disease, stage 3b: Secondary | ICD-10-CM

## 2021-05-31 DIAGNOSIS — E119 Type 2 diabetes mellitus without complications: Secondary | ICD-10-CM

## 2021-05-31 DIAGNOSIS — N179 Acute kidney failure, unspecified: Secondary | ICD-10-CM | POA: Diagnosis present

## 2021-05-31 LAB — COMPREHENSIVE METABOLIC PANEL
ALT: 63 U/L — ABNORMAL HIGH (ref 0–44)
AST: 58 U/L — ABNORMAL HIGH (ref 15–41)
Albumin: 2.9 g/dL — ABNORMAL LOW (ref 3.5–5.0)
Alkaline Phosphatase: 156 U/L — ABNORMAL HIGH (ref 38–126)
Anion gap: 9 (ref 5–15)
BUN: 118 mg/dL — ABNORMAL HIGH (ref 8–23)
CO2: 20 mmol/L — ABNORMAL LOW (ref 22–32)
Calcium: 9 mg/dL (ref 8.9–10.3)
Chloride: 103 mmol/L (ref 98–111)
Creatinine, Ser: 3.21 mg/dL — ABNORMAL HIGH (ref 0.61–1.24)
GFR, Estimated: 17 mL/min — ABNORMAL LOW (ref >60)
Glucose, Bld: 151 mg/dL — ABNORMAL HIGH (ref 70–99)
Potassium: 5.7 mmol/L — ABNORMAL HIGH (ref 3.5–5.1)
Sodium: 132 mmol/L — ABNORMAL LOW (ref 135–145)
Total Bilirubin: 1.2 mg/dL (ref 0.3–1.2)
Total Protein: 6.3 g/dL — ABNORMAL LOW (ref 6.5–8.1)

## 2021-05-31 LAB — LACTIC ACID, PLASMA: Lactic Acid, Venous: 1.2 mmol/L (ref 0.5–1.9)

## 2021-05-31 MED ORDER — AMIODARONE HCL 200 MG PO TABS
200.0000 mg | ORAL_TABLET | Freq: Two times a day (BID) | ORAL | Status: DC
  Administered 2021-05-31 – 2021-06-01 (×2): 200 mg via ORAL
  Filled 2021-05-31 (×2): qty 1

## 2021-05-31 MED ORDER — LORAZEPAM 0.5 MG PO TABS: 0.5000 mg | ORAL_TABLET | Freq: Four times a day (QID) | ORAL | Status: DC | PRN

## 2021-05-31 MED ORDER — SODIUM CHLORIDE 0.9 % IV SOLN: 250.0000 mL | INTRAVENOUS | Status: DC

## 2021-05-31 MED ORDER — HYDROMORPHONE HCL 1 MG/ML IJ SOLN
0.5000 mg | INTRAMUSCULAR | Status: DC | PRN
  Administered 2021-05-31 – 2021-06-01 (×2): 0.5 mg via INTRAVENOUS
  Administered 2021-06-01: 1 mg via INTRAVENOUS
  Administered 2021-06-01: 0.5 mg via INTRAVENOUS
  Filled 2021-05-31 (×4): qty 1

## 2021-05-31 MED ORDER — FINASTERIDE 5 MG PO TABS
5.0000 mg | ORAL_TABLET | Freq: Every day | ORAL | Status: DC
  Administered 2021-06-01: 5 mg via ORAL
  Filled 2021-05-31: qty 1

## 2021-05-31 MED ORDER — TAMSULOSIN HCL 0.4 MG PO CAPS
0.4000 mg | ORAL_CAPSULE | Freq: Every day | ORAL | Status: DC
  Administered 2021-05-31 – 2021-06-01 (×2): 0.4 mg via ORAL
  Filled 2021-05-31 (×2): qty 1

## 2021-05-31 MED ORDER — GUAIFENESIN-DM 100-10 MG/5ML PO SYRP: 5.0000 mL | ORAL_SOLUTION | ORAL | Status: DC | PRN

## 2021-05-31 MED ORDER — ORAL CARE MOUTH RINSE
15.0000 mL | Freq: Two times a day (BID) | OROMUCOSAL | Status: DC
  Administered 2021-05-31 – 2021-06-01 (×2): 15 mL via OROMUCOSAL

## 2021-05-31 MED ORDER — HYDROMORPHONE HCL 1 MG/ML IJ SOLN
0.5000 mg | INTRAMUSCULAR | Status: DC | PRN
  Administered 2021-05-31: 1 mg via INTRAVENOUS
  Filled 2021-05-31: qty 1

## 2021-05-31 MED ORDER — ONDANSETRON HCL 4 MG/2ML IJ SOLN: 4.0000 mg | Freq: Four times a day (QID) | INTRAMUSCULAR | Status: DC | PRN

## 2021-05-31 MED ORDER — ENSURE ENLIVE PO LIQD
237.0000 mL | Freq: Two times a day (BID) | ORAL | Status: DC
  Administered 2021-06-01: 237 mL via ORAL

## 2021-05-31 MED ORDER — ONDANSETRON HCL 4 MG PO TABS
4.0000 mg | ORAL_TABLET | Freq: Four times a day (QID) | ORAL | Status: DC | PRN
Start: 2021-05-31 — End: 2021-06-02

## 2021-05-31 MED ORDER — IPRATROPIUM-ALBUTEROL 0.5-2.5 (3) MG/3ML IN SOLN: 3.0000 mL | RESPIRATORY_TRACT | Status: DC | PRN

## 2021-05-31 NOTE — TOC Progression Note (Signed)
Transition of Care (TOC) - Progression Note  ? ? ?Patient Details  ?Name: Charles Lester ?MRN: [id number] ?Date of Birth: 1924-07-22 ? ?Transition of Care (TOC) CM/SW Contact  ?Alois Colgan D, LCSW ?Phone Number: ?05/31/2021, 3:26 PM ? ?Clinical Narrative:    ?Per Palliative Care NP report, family desires referral to [facility]. Referral made. ?[facility] accepts referral and patient admitted to [facility]. ?No beds available at [facility]. Patient will be GIP. Attending and RN aware that chart can be transitioned to GIP.  ? ? ?Expected Discharge Plan: [place] ?Barriers to Discharge: Continued Medical Work up ? ?Expected Discharge Plan and Services ?Expected Discharge Plan: [place] ?In-house Referral: Clinical Social Work ?  ?Post Acute Care Choice: Home Health ?Living arrangements for the past 2 months: [place] ?                ?  ?  ?  ?  ?  ?HH Arranged: PT, RN ?[place] Agency: [place] ?Date HH Agency Contacted: 05/23/21 ?  ?Representative spoke with at [place]: Marjory Lies ? ? ?Social Determinants of Health (SDOH) Interventions ?  ? ?Readmission Risk Interventions ? ?  05/23/2021  ? 11:58 AM  ?Readmission Risk Prevention Plan  ?Transportation Screening Complete  ?Home Care Screening Complete  ?Medication Review (RN CM) Complete  ? ? ?

## 2021-05-31 NOTE — Discharge Summary (Signed)
?Physician Discharge Summary ?  ?Patient: Charles Lester MRN: [id number] DOB: 31-Aug-1924  ?Admit date:     05/20/2021  ?Discharge date: 05/31/21  ?Discharge Physician: Barton Dubois  ? ?PCP: [name], MD  ? ?Recommendations at discharge:  ?Full comfort care and symptomatic management. ? ?Discharge Diagnoses: ?Principal Problem: ?  Acute respiratory failure with hypoxia ([place]) ?Active Problems: ?  DMII (diabetes mellitus, type 2) ([place]) ?  HTN (hypertension) ?  CORONARY ATHEROSCLEROSIS NATIVE CORONARY ARTERY ?  Diastolic congestive heart failure ([place]) ?  Acute clinical systolic heart failure ([place]) ?  Sacral decubitus ulcer, stage II ([place]) ? ?Hospital Course: ?Charles Lester is a 88 y.o. male with medical history significant of coronary artery disease with cardiomyopathy and heart failure with reduced EF of 30 to 35% on echo on 2/23 and moderate aortic stenosis, peripheral artery disease, type 2 diabetes that is well controlled with diet.   ?  ?Patient presents with 1 week of worsening shortness of breath sputum production orthopnea and dyspnea with exertion.  Noted to be markedly hypoxic in the 70s at intake requiring nonrebreather and subsequently BiPAP for respiratory support.  Patient admitted for close monitoring, aggressive diuresis and respiratory support in the setting of heart failure exacerbation. ? ?Assessment and Plan: ?Acute hypoxic respiratory failure in the setting of combined systolic/diastolic heart failure exacerbation, POA ?-Continue oxygen supplementation and provide symptomatic management for shortness of breath and air hunger with the use of Ativan and Dilaudid. ?-Overall prognosis is very poor; anticipating days/weeks for life expectancy. ?-Appreciate cardiology service and assistance; 2D echo demonstrating worsening inpatient ejection fraction down to 10-15% given concern for cardiogenic shock presentation. ?-Family meeting with palliative care and cardiology has triggered decision to transition  to full comfort care and symptomatic management only. ?-Family has accepted hospice and patient has been accepted to [facility]. Waiting bed availability. ?  ?D-dimer elevated ?-Concern for underlying pulmonary embolism however patient had recent CTA within the last 2 months with negative findings, cannot repeat CTA now given elevated creatinine as below; VQ also limited value at this time given bilateral effusions. ?-At this point will transition to full comfort care and symptomatic management. ?  ?AKI without history of CKD ?-In the setting of above diuresis and decreased perfusion with acute on chronic CHF. ?-Continue to follow renal function trend ?-Last creatinine has continued to worse and creatinine is currently 3.21; with a BUN of 118 ?-No further blood work ?-Last potassium 5.7 ?-Sodium level 132. ?-Transitioning to full comfort care and symptomatic management only. ?-Lactic acid 2.5 ?  ?Essential hypertension  ?-Continue to be significantly hypotensive  ?-Transitioning to full comfort care and symptomatic management ?-Antihypertensive agents and pressor supports has been discontinued at this point. ? ?BPH ?-No complaints of urinary retention symptoms ?-Continue the use of Proscar and Flomax. ?  ?History of CAD, stable ?-No chest pain ?-Transitioning to full comfort care and symptomatic management only ?-All medications except for amiodarone has been discontinued at this moment. ?  ?Non-insulin-dependent diabetes mellitus type 2   ?-Diet controlled prior to admission. ?-Focuses on comfort care and symptomatic management ?-no further CBG's check and not insulin therapy to be provided ?-diet has been liberalize. ?  ?Sacral decubitus ulcer stage II-III, chronic, POA  ?-Continue to follow recommendation by wound care service to keep area clean and dry. ?-Preventive measures and conservative positioning instructed. ?-No signs of superimposed infection appreciated; but demonstrating serosanguineous  drainage.. ?  ?Aortic stenosis  ?-Appreciate assistance and recommendations by cardiology  service ?-Not a candidate for surgical intervention ?-Plan is to transition to full comfort care. ?-Very poor prognosis, life expectancy days/weeks. ?  ?Peripheral vascular disease  ?-Transitioning to full comfort care ?-Aspirin, Plavix and statin has been discontinued. ?-Will focus on symptomatic management. ?  ?Atrial tachycardia: ?-Unable to fully exclude atypical atrial flutter; CHA2DS2-VASc score above 6.  Patient is not a candidate for anticoagulation. ?-After discussing with palliative care, family and cardiology service decision was made to pursuit full comfort care and symptomatic management only. ?-All medications except for amiodarone has been discontinued at this point. ? ?Consultants: Palliative care and cardiology service. ?Procedures performed: See below for x-ray report; 2D echo demonstrating ejection fraction of 10 to 15% and global hypokinesis.  Moderate pericardial effusion; and with moderate aortic stenosis. ?Disposition: Hospice care ?Diet recommendation: Comfort diet. ? ?DISCHARGE MEDICATION: ?Allergies as of 05/31/2021   ?No Known Allergies ?  ? ?  ?Medication List  ?  ? ?ASK your doctor about these medications   ? ?amLODipine 2.5 MG tablet ?Commonly known as: NORVASC ?Take 2.5 mg by mouth daily. ?  ?aspirin EC 81 MG tablet ?Take 1 tablet (81 mg total) by mouth daily. ?  ?atorvastatin 20 MG tablet ?Commonly known as: LIPITOR ?Take 1 tablet (20 mg total) by mouth daily. ?  ?clopidogrel 75 MG tablet ?Commonly known as: PLAVIX ?Take 1 tablet (75 mg total) by mouth daily. ?  ?ferrous sulfate 325 (65 FE) MG tablet ?Take 325 mg by mouth daily with breakfast. ?  ?finasteride 5 MG tablet ?Commonly known as: PROSCAR ?Take 5 mg by mouth daily. ?  ?Fish Oil 1000 MG Caps ?Take 1,000 mg by mouth daily. ?  ?furosemide 20 MG tablet ?Commonly known as: LASIX ?Take 1 tablet (20 mg total) by mouth daily. ?  ?hydrALAZINE 10  MG tablet ?Commonly known as: APRESOLINE ?Take 1 tablet (10 mg total) by mouth 2 (two) times daily. ?  ?isosorbide mononitrate 30 MG 24 hr tablet ?Commonly known as: IMDUR ?Take 0.5 tablets (15 mg total) by mouth daily. ?  ?lisinopril 40 MG tablet ?Commonly known as: ZESTRIL ?Take 40 mg by mouth daily. ?  ?metoprolol succinate 50 MG 24 hr tablet ?Commonly known as: TOPROL-XL ?Take 1 tablet (50 mg total) by mouth daily. Take with or immediately following a meal. ?  ?niacin 1000 MG CR tablet ?Commonly known as: NIASPAN ?Take 1,000 mg by mouth daily. ?  ?tamsulosin 0.4 MG Caps capsule ?Commonly known as: FLOMAX ?Take 0.4 mg by mouth in the morning and at bedtime. ?  ?vitamin C 500 MG tablet ?Commonly known as: ASCORBIC ACID ?Take 500 mg by mouth daily. ?  ?Vitamin D3 10 MCG (400 UNIT) Caps ?Take 400 Units by mouth daily. ?  ? ?  ? ?  ?  ? ? ?  ?Discharge Care Instructions  ?(From admission, onward)  ?  ? ? ?  ? ?  Start     Ordered  ? 05/31/21 0000  Discharge wound care:       ?Comments: Wound care to sacral stage II pressure injury: Cleanse with normal saline, pat dry.  Cover with size appropriate piece of Xeroform gauze, top with dry gauze 2 x 2 and tape with silicone foam for sacrum.  Position patient from side to side minimize time in supine position to minimize pressure.  Wound care to be done once a day.  ? 05/31/21 1544  ? ?  ?  ? ?  ? ? ?Discharge Exam: ?Filed Weights  ?  05/28/21 3005 05/29/21 0314 05/30/21 0500  ?Weight: 67.2 kg 65.6 kg 66.5 kg  ? ?General exam: Alert, awake, oriented x 3; patient continue reporting short winded sensation with minimal assistance and is complaining of pain in his pressure injury.  Reports no palpitations currently. ?Respiratory system: Decreased breath sounds at the bases, no frank crackles, no wheezing; 2 L of cannula in place. ?Cardiovascular system: Rate controlled currently; no rubs or gallops, positive systolic murmur.  No JVD on exam. ?Gastrointestinal system: Abdomen is  nondistended, soft and nontender. No organomegaly or masses felt. Normal bowel sounds heard. ?Central nervous system: Alert and oriented. No focal neurological deficits. ?Extremities: No cyanosis or clubbing; t

## 2021-05-31 NOTE — Progress Notes (Signed)
? ?Progress Note ? ?Patient Name: Charles Lester ?Date of Encounter: 05/31/2021 ? ?[facility] Cardiologist: Rozann Lesches, MD  ? ?Subjective  ? ?Some comfort with breathing last night after dilaudid ? ?Inpatient Medications  ?  ?Scheduled Meds: ? amiodarone  200 mg Oral BID  ? aspirin EC  81 mg Oral Daily  ? atorvastatin  20 mg Oral Daily  ? Chlorhexidine Gluconate Cloth  6 each Topical Daily  ? clopidogrel  75 mg Oral Daily  ? enoxaparin (LOVENOX) injection  30 mg Subcutaneous Q24H  ? feeding supplement  237 mL Oral BID BM  ? finasteride  5 mg Oral Daily  ? mouth rinse  15 mL Mouth Rinse BID  ? metoprolol succinate  12.5 mg Oral Daily  ? midodrine  5 mg Oral TID WC  ? multivitamin with minerals  1 tablet Oral Daily  ? tamsulosin  0.4 mg Oral QPC supper  ? ?Continuous Infusions: ? sodium chloride Stopped (05/29/21 1200)  ? sodium chloride Stopped (05/28/21 0459)  ? ?PRN Meds: ?guaiFENesin-dextromethorphan, HYDROmorphone (DILAUDID) injection, ipratropium-albuterol, ondansetron **OR** ondansetron (ZOFRAN) IV  ? ?Vital Signs  ?  ?Vitals:  ? 05/31/21 0700 05/31/21 0758 05/31/21 0800 05/31/21 0900  ?BP: (!) 108/57  (!) 120/43 (!) 111/57  ?Pulse: (!) 41 80 (!) 105 99  ?Resp: 20 (!) 24 (!) 23 (!) 26  ?Temp:  (!) 97.4 ?F (36.3 ?C)    ?TempSrc:  Axillary    ?SpO2: 98% 98% 98% 99%  ?Weight:      ?Height:      ? ? ?Intake/Output Summary (Last 24 hours) at 05/31/2021 0942 ?Last data filed at 05/31/2021 0100 ?Gross per 24 hour  ?Intake 200 ml  ?Output 350 ml  ?Net -150 ml  ? ? ?  05/30/2021  ?  5:00 AM 05/29/2021  ?  3:14 AM 05/28/2021  ?  6:27 AM  ?Last 3 Weights  ?Weight (lbs) 146 lb 9.7 oz 144 lb 10 oz 148 lb 2.4 oz  ?Weight (kg) 66.5 kg 65.6 kg 67.2 kg  ?   ? ?Telemetry  ?  ?SR, short runs atach - Personally Reviewed ? ?ECG  ?  ?N/a - Personally Reviewed ? ?Physical Exam  ? ?GEN: No acute distress.   ?Neck: +JVD ?Cardiac: SR, 2/6 systolic murmur rusb ?Respiratory: decreased breath sounds bilateral bases ?GI: Soft, nontender,  non-distended  ?MS: 1-2+bilateral LE edema ?Neuro:  Nonfocal  ?Psych: Normal affect  ? ?Labs  ?  ?High Sensitivity Troponin:  No results for input(s): TROPONINIHS in the last 720 hours.   ?Chemistry ?Recent Labs  ?Lab 05/29/21 ?0356 05/30/21 ?0809 05/31/21 ?0419  ?NA 132* 131* 132*  ?K 5.1 5.6* 5.7*  ?CL 104 102 103  ?CO2 20* 18* 20*  ?GLUCOSE 130* 196* 151*  ?BUN 100* 108* 118*  ?CREATININE 2.91* 3.17* 3.21*  ?CALCIUM 9.0 9.1 9.0  ?PROT  --  6.2* 6.3*  ?ALBUMIN  --  2.8* 2.9*  ?AST  --  54* 58*  ?ALT  --  53* 63*  ?ALKPHOS  --  137* 156*  ?BILITOT  --  1.3* 1.2  ?GFRNONAA 19* 17* 17*  ?ANIONGAP '8 11 9  '$ ?  ?Lipids No results for input(s): CHOL, TRIG, HDL, LABVLDL, LDLCALC, CHOLHDL in the last 168 hours.  ?Hematology ?Recent Labs  ?Lab 05/25/21 ?7628 05/26/21 ?3151  ?WBC 5.1 5.3  ?RBC 3.27* 3.12*  ?HGB 10.3* 10.1*  ?HCT 31.5* 30.2*  ?MCV 96.3 96.8  ?MCH 31.5 32.4  ?MCHC 32.7 33.4  ?  RDW 15.1 15.0  ?PLT 114* 113*  ? ?Thyroid No results for input(s): TSH, FREET4 in the last 168 hours.  ?BNP ?Recent Labs  ?Lab 05/30/21 ?0809  ?BNP 1,497.0*  ?  ?DDimer No results for input(s): DDIMER in the last 168 hours.  ? ?Radiology  ?  ?DG CHEST PORT 1 VIEW ? ?Result Date: 05/30/2021 ?CLINICAL DATA:  Shortness of breath. Respiratory failure with hypoxia. EXAM: PORTABLE CHEST 1 VIEW COMPARISON:  05/23/2021 FINDINGS: Persistent cardiomegaly. Aortic atherosclerosis and tortuosity. Persistent moderate bilateral pleural effusions with dependent pulmonary atelectasis. Findings appear quite similar to the study of 1 week ago. IMPRESSION: Similar appearance with persistent moderate bilateral effusions and lower lobe atelectasis. Electronically Signed   By: Nelson Chimes M.D.   On: 05/30/2021 08:01  ? ?ECHOCARDIOGRAM COMPLETE ? ?Result Date: 05/30/2021 ?   ECHOCARDIOGRAM REPORT   Patient Name:   Charles Lester Date of Exam: 05/30/2021 Medical Rec #:  [id number]      Height:       67.0 in Accession #:    [id number]     Weight:       146.6 lb Date of  Birth:  04/17/1924      BSA:          1.772 m? Patient Age:    88 years       BP:           82/51 mmHg Patient Gender: M              HR:           75 bpm. Exam Location:  [facility] Procedure: 2D Echo, Color Doppler, Cardiac Doppler and Strain Analysis Indications:    Dyspnea  History:        Patient has prior history of Echocardiogram examinations, most                 recent 03/06/2021. Signs/Symptoms:Chest Pain; Risk                 Factors:Hypertension, Diabetes, Dyslipidemia and Former Smoker.  Sonographer:    [name] RDCS Referring Phys: [id number] [place]  1. Left ventricular ejection fraction, by estimation, is 10-15%. The left ventricle has severely decreased function. The left ventricle demonstrates global hypokinesis. Left ventricular diastolic parameters are indeterminate. The average left ventricular global longitudinal strain is -3.2 %. The global longitudinal strain is abnormal.  2. Right ventricular systolic function is normal. The right ventricular size is normal.  3. Left atrial size was moderately dilated.  4. Moderate pericardial effusion. The pericardial effusion is localized near the right atrium. There is no evidence of cardiac tamponade. Large pleural effusion in the left lateral region.  5. The mitral valve is abnormal. Mild mitral valve regurgitation. No evidence of mitral stenosis.  6. The tricuspid valve is abnormal.  7. Moderate low flow low gradient aortic stenosis. AVA VTI 1.14 mean grade 8 DI 0.36. The aortic valve has an indeterminant number of cusps. There is moderate calcification of the aortic valve. There is moderate thickening of the aortic valve. Aortic valve regurgitation is moderate.  8. The inferior vena cava is normal in size with <50% respiratory variability, suggesting right atrial pressure of 8 mmHg.  9. Evidence of atrial level shunting detected by color flow Doppler. There is a patent foramen ovale with predominantly left to right shunting  across the atrial septum. FINDINGS  Left Ventricle: Left ventricular ejection fraction, by estimation, is 10-15%. The left ventricle  has severely decreased function. The left ventricle demonstrates global hypokinesis. The average left ventricular global longitudinal strain is -3.2 %. The global longitudinal strain is abnormal. The left ventricular internal cavity size was normal in size. There is no left ventricular hypertrophy. Left ventricular diastolic parameters are indeterminate. Right Ventricle: The right ventricular size is normal. Right vetricular wall thickness was not well visualized. Right ventricular systolic function is normal. Left Atrium: Left atrial size was moderately dilated. Right Atrium: Right atrial size was normal in size. Pericardium: A moderately sized pericardial effusion is present. The pericardial effusion is localized near the right atrium. There is no evidence of cardiac tamponade. Mitral Valve: The mitral valve is abnormal. There is mild thickening of the mitral valve leaflet(s). There is mild calcification of the mitral valve leaflet(s). Mild mitral annular calcification. Mild mitral valve regurgitation. No evidence of mitral valve stenosis. Tricuspid Valve: The tricuspid valve is abnormal. Tricuspid valve regurgitation is mild . No evidence of tricuspid stenosis. Aortic Valve: Moderate low flow low gradient aortic stenosis. AVA VTI 1.14 mean grade 8 DI 0.36. The aortic valve has an indeterminant number of cusps. There is moderate calcification of the aortic valve. There is moderate thickening of the aortic valve.  There is moderate aortic valve annular calcification. Aortic valve regurgitation is moderate. Aortic regurgitation PHT measures 262 msec. Aortic valve mean gradient measures 7.7 mmHg. Aortic valve peak gradient measures 15.6 mmHg. Aortic valve area, by VTI measures 1.14 cm?. Pulmonic Valve: The pulmonic valve was not well visualized. Pulmonic valve regurgitation is mild to  moderate. No evidence of pulmonic stenosis. Aorta: The aortic root is normal in size and structure. Venous: The inferior vena cava is normal in size with less than 50% respiratory variability, suggesting righ

## 2021-05-31 NOTE — H&P (Signed)
?History and Physical  ? ? ?Patient: Charles Lester NOM:[id number] DOB: [birth year] ?DOA: 05/31/2021 ?DOS: the patient was seen and examined on 05/31/2021 ?PCP: [name], MD  ?Patient coming from: Home ? ?Chief Complaint: SOB ? ?HPI: Charles Lester is a 88 y.o. male with medical history significant of coronary artery disease with cardiomyopathy and heart failure with reduced EF of 30 to 35% on echo on 2/23 and moderate aortic stenosis, peripheral artery disease, type 2 diabetes that is well controlled with diet.   ?  ?Patient presents with 1 week of worsening shortness of breath sputum production orthopnea and dyspnea with exertion.  Noted to be markedly hypoxic in the 70s at intake requiring nonrebreather and subsequently BiPAP for respiratory support.  Patient admitted for close monitoring, aggressive diuresis and respiratory support in the setting of heart failure exacerbation. ? ?Patient has been admitted from 05/20/21 to 05/31/21 when after [place] discussion and advance care planning decision made to transition to comfort care and pursuit hospice placement. Patient is now GIP. ?  ?Review of Systems: As mentioned in the history of present illness. All other systems reviewed and are negative. ?Past Medical History:  ?Diagnosis Date  ? Aortic stenosis   ? a. Mild to moderate 2017 b. echo in 03/2021 showing moderate AS and moderate to severe AI  ? Arthritis   ? CAD (coronary artery disease)   ? Cardiomyopathy ([place])   ? a. EF 30-35% by echo in 03/2020. Patient declined further work-up in the setting of his advanced age.  ? Essential hypertension   ? Hyperlipidemia   ? Ischemic heart disease   ? Based on Myoview 2010 revealing inferolateral scar but no active ischemia  ? PAD (peripheral artery disease) ([place])   ? Type 2 diabetes mellitus ([place])   ? ?Past Surgical History:  ?Procedure Laterality Date  ? NO PAST SURGERIES    ? ?Social History:  reports that he quit smoking about 16 years ago. His smoking use included cigarettes.  He has never used smokeless tobacco. He reports that he does not drink alcohol and does not use drugs. ? ?No Known Allergies ? ?Family History  ?Problem Relation Age of Onset  ? Hypertension Father   ? Hypertension Sister   ? Hypertension Brother   ? Coronary artery disease Neg Hx   ? ? ?Prior to Admission medications   ?Medication Sig Start Date End Date Taking? Authorizing Provider  ?amLODipine (NORVASC) 2.5 MG tablet Take 2.5 mg by mouth daily. 05/02/21   [provider]  ?aspirin EC 81 MG tablet Take 1 tablet (81 mg total) by mouth daily. 10/22/20   Verta Ellen., NP  ?atorvastatin (LIPITOR) 20 MG tablet Take 1 tablet (20 mg total) by mouth daily. 10/22/20   Verta Ellen., NP  ?Cholecalciferol (VITAMIN D3) 10 MCG (400 UNIT) CAPS Take 400 Units by mouth daily.    [provider]  ?clopidogrel (PLAVIX) 75 MG tablet Take 1 tablet (75 mg total) by mouth daily. 10/22/20   Verta Ellen., NP  ?ferrous sulfate 325 (65 FE) MG tablet Take 325 mg by mouth daily with breakfast.    [provider]  ?finasteride (PROSCAR) 5 MG tablet Take 5 mg by mouth daily. 06/11/12   [provider]  ?furosemide (LASIX) 20 MG tablet Take 1 tablet (20 mg total) by mouth daily. 03/08/21   Deatra James, MD  ?hydrALAZINE (APRESOLINE) 10 MG tablet Take 1 tablet (10 mg total) by mouth  2 (two) times daily. 03/07/21 05/20/21  Deatra James, MD  ?isosorbide mononitrate (IMDUR) 30 MG 24 hr tablet Take 0.5 tablets (15 mg total) by mouth daily. 05/02/21 06/01/21  Strader, Fransisco Hertz, PA-C  ?lisinopril (ZESTRIL) 40 MG tablet Take 40 mg by mouth daily. 05/02/21   [provider]  ?metoprolol succinate (TOPROL-XL) 50 MG 24 hr tablet Take 1 tablet (50 mg total) by mouth daily. Take with or immediately following a meal. 05/02/21 06/01/21  Strader, Fransisco Hertz, PA-C  ?niacin (NIASPAN) 1000 MG CR tablet Take 1,000 mg by mouth daily. 03/27/19   [provider]  ?Omega-3 Fatty Acids (FISH OIL) 1000 MG  CAPS Take 1,000 mg by mouth daily.    [provider]  ?tamsulosin (FLOMAX) 0.4 MG CAPS Take 0.4 mg by mouth in the morning and at bedtime. 06/01/12   [provider]  ?vitamin C (ASCORBIC ACID) 500 MG tablet Take 500 mg by mouth daily.    [provider]  ? ? ?Physical Exam: ?Vitals:  ? 05/31/21 1647  ?Pulse: 79  ?Resp: 18  ?Temp: (!) 97.3 ?F (36.3 ?C)  ?TempSrc: Axillary  ?SpO2: 95%  ?Weight: 66.5 kg  ?Height: '5\' 7"'$  (1.702 m)  ? ?General exam: Alert, awake, oriented x 3; patient continue reporting short winded sensation with minimal assistance and is complaining of pain in his pressure injury.  Reports no palpitations currently. ?Respiratory system: Decreased breath sounds at the bases, no frank crackles, no wheezing; 2 L of cannula in place. ?Cardiovascular system: Rate controlled currently; no rubs or gallops, positive systolic murmur.  No JVD on exam. ?Gastrointestinal system: Abdomen is nondistended, soft and nontender. No organomegaly or masses felt. Normal bowel sounds heard. ?Central nervous system: Alert and oriented. No focal neurological deficits. ?Extremities: No cyanosis or clubbing; trace pedal edema appreciated bilaterally. ?Skin: No petechiae; stage II-III pressure injury appreciated in his sacral area; positive serosanguineous drainage. ?Psychiatry: Judgement and insight appear normal.  Intermittently anxious.  Flat mood. ? ?Data Reviewed: ?No new data to review. ?Plan is for comfort care and symptomatic management only. ? ?Assessment and Plan: ?Acute hypoxic respiratory failure in the setting of combined systolic/diastolic heart failure exacerbation, POA ?-Continue oxygen supplementation and provide symptomatic management for shortness of breath and air hunger with the use of Ativan and Dilaudid. ?-Overall prognosis is very poor; anticipating days/weeks for life expectancy. ?-Appreciate cardiology service and assistance; 2D echo demonstrating worsening inpatient ejection  fraction down to 10-15% given concern for cardiogenic shock presentation. ?-Family meeting with palliative care and cardiology has triggered decision to transition to full comfort care and symptomatic management only. ?-Family has accepted hospice and patient has been accepted to [facility]. Waiting bed availability. ?  ?D-dimer elevated ?-Concern for underlying pulmonary embolism however patient had recent CTA within the last 2 months with negative findings, cannot repeat CTA now given elevated creatinine as below; VQ also limited value at this time given bilateral effusions. ?-At this point will transition to full comfort care and symptomatic management. ?  ?AKI without history of CKD ?-In the setting of above diuresis and decreased perfusion with acute on chronic CHF. ?-Continue to follow renal function trend ?-Last creatinine has continued to worse and creatinine is currently 3.21; with a BUN of 118 ?-No further blood work ?-Last potassium 5.7 ?-Sodium level 132. ?-Transitioning to full comfort care and symptomatic management only. ?-Lactic acid 2.5 ?  ?Essential hypertension  ?-Continue to be significantly hypotensive  ?-Transitioning to full comfort care and symptomatic  management ?-Antihypertensive agents and pressor supports has been discontinued at this point. ?  ?BPH ?-No complaints of urinary retention symptoms ?-Continue the use of Proscar and Flomax. ?  ?History of CAD, stable ?-No chest pain ?-Transitioning to full comfort care and symptomatic management only ?-All medications except for amiodarone has been discontinued at this moment. ?  ?Non-insulin-dependent diabetes mellitus type 2   ?-Diet controlled prior to admission. ?-Focuses on comfort care and symptomatic management ?-no further CBG's check and not insulin therapy to be provided ?-diet has been liberalize. ?  ?Sacral decubitus ulcer stage II-III, chronic, POA  ?-Continue to follow recommendation by wound care service to keep area clean  and dry. ?-Preventive measures and conservative positioning instructed. ?-No signs of superimposed infection appreciated; but demonstrating serosanguineous drainage.. ?  ?Aortic stenosis  ?-Appreciate

## 2021-05-31 NOTE — Progress Notes (Addendum)
Palliative:   ?Mr. Granquist is lying quietly in bed.  He greets me, making but not keeping eye contact.  He appears acutely/chronically ill and very frail, thin and cachectic.  He is alert and oriented x3, able to make his basic needs known.  His family is at bedside including wife of 55 years, daughter [name] who is his healthcare surrogate, son [name], son Hamza, daughter Vaughan Basta, and several grandchildren.  Presents today for our family meeting is cardiologist, Dr. Harl Bowie. ? ?Dr. Harl Bowie talks about Mr. Fielden acute heart condition.  He talks about heart failure, fluid overload, kidney dysfunction, difficulty in treating because of heart dysfunction.  Dr. Harl Bowie shares that Mr. Colberg is in cardiogenic shock.  All questions answered prior to Dr. Nelly Laurence exit. ? ?Mr. Roig and his family and I talk about how we can continue to care for him.  We talk about medications and unburdening him from medications that are not changing what is happening.  We talked about medications for pain and anxiety, breathlessness.  Patient states that the Dilaudid he received yesterday really helped his breathing.  Patient and family agree to stop all medications.  We agreed to keep cardiac medications to allow family time to visit. ? ?We talk about hospice care.  We talk about at home hospice and residential hospice.  At this point Mr. Rodriques states that he would want residential hospice where he could have experienced staff care for him in particular if he has issues.  Provider choice offered.  Patient and family request hospice of [facility] due to location. ? ?Conference with attending, cardiology, bedside nursing staff, transition of care team related to patient condition, needs, goals of care, request for residential hospice. ?Reduced medication burden, but capped cardiac medications until transfer to residential hospice. ? ?Plan: Requesting comfort and dignity at end-of-life, [facility] residential hospice.  At this  point continuing cardiac medications to allow family time to visit.  ? ?60 minutes  ?Quinn Axe, NP ?Palliative medicine team ?Team phone [phone] ?Greater than 50% of this time was spent counseling and coordinating care related to the above assessment and plan. ? ?

## 2021-06-01 DIAGNOSIS — J9601 Acute respiratory failure with hypoxia: Secondary | ICD-10-CM

## 2021-06-01 DIAGNOSIS — I5021 Acute systolic (congestive) heart failure: Secondary | ICD-10-CM

## 2021-06-01 LAB — RESP PANEL BY RT-PCR (FLU A&B, COVID) ARPGX2
Influenza A by PCR: NEGATIVE
Influenza B by PCR: NEGATIVE
SARS Coronavirus 2 by RT PCR: NEGATIVE

## 2021-06-01 MED ORDER — HYDROMORPHONE HCL 1 MG/ML IJ SOLN
0.5000 mg | INTRAMUSCULAR | Status: DC | PRN
  Administered 2021-06-01: 0.5 mg via INTRAVENOUS
  Administered 2021-06-01: 1 mg via INTRAVENOUS
  Filled 2021-06-01 (×2): qty 1

## 2021-06-01 NOTE — Progress Notes (Signed)
?  ?       ?PROGRESS NOTE ? ?Charles Lester QJF:[id number] DOB: 12-Aug-1924 DOA: 05/31/2021 ?PCP: Charles Gravel, MD ? ?Brief History:  ?88 y.o. male with medical history significant of coronary artery disease with cardiomyopathy and heart failure with reduced EF of 30 to 35% on echo on 2/23 and moderate aortic stenosis, peripheral artery disease, type 2 diabetes that is well controlled with diet.   ?  ?Patient presents with 1 week of worsening shortness of breath sputum production orthopnea and dyspnea with exertion.  Noted to be markedly hypoxic in the 70s at intake requiring nonrebreather and subsequently BiPAP for respiratory support.  Patient admitted for close monitoring, aggressive diuresis and respiratory support in the setting of heart failure exacerbation. ?  ?Patient has been admitted from 05/20/21 to 05/31/21 when after [place] discussion and advance care planning decision made to transition to comfort care and pursuit hospice placement. Patient is now GIP.  On 06/01/21, patient remained hemodynamically stable with stable oxygenation for transfer to residential hospice.  ? ? ?Assessment and Plan: ? ?Acute hypoxic respiratory failure in the setting of combined systolic/diastolic heart failure exacerbation, POA ?-Continue oxygen supplementation and provide symptomatic management for shortness of breath and air hunger with the use of Ativan and Dilaudid. ?-Overall prognosis is very poor; anticipating days/weeks for life expectancy. ?-Appreciate cardiology service and assistance; 2D echo demonstrating worsening inpatient ejection fraction down to 10-15% given concern for cardiogenic shock presentation. ?-Family meeting with palliative care and cardiology has triggered decision to transition to full comfort care and symptomatic management only. ?-Family has accepted hospice and patient has been accepted to [facility].  ?-patient remains stable for transfer to residential hospice ? ? ?AKI without history of CKD ?-In the  setting of above diuresis and decreased perfusion with acute on chronic CHF. ?-suspect cardiorenal syndrome ?-Last creatinine has continued to worse and creatinine is currently 3.21; with a BUN of 118 ?-No further blood work ?-Last potassium 5.7 ?-Sodium level 132. ?-Transitioning to full comfort care and symptomatic management only>>stable to transfer to residential hospice ? ? ? ?Family Communication:   Family at bedside updated 5/3 ? ?Consultants:  palliative ? ?Code Status:  FULL COMFORT ? ?DVT Prophylaxis:  FULL COMFORT ? ? ?Procedures: ?As Listed in Progress Note Above ? ? ? ? ? ? ? ? ?Subjective: ?Pt complains of sob.  Denies cp, abd pain, headache. ? ?Objective: ?Vitals:  ? 06/01/21 0400 06/01/21 0500 06/01/21 0600 06/01/21 1200  ?BP:    (!) 97/46  ?Pulse: 75 79 80 90  ?Resp: 14 12 (!) 24 20  ?Temp:      ?TempSrc:      ?SpO2: 97% 98% 96% 99%  ?Weight:      ?Height:      ? ? ?Intake/Output Summary (Last 24 hours) at 06/01/2021 1403 ?Last data filed at 05/31/2021 2355 ?Gross per 24 hour  ?Intake 100 ml  ?Output 350 ml  ?Net -250 ml  ? ?Weight change:  ?Exam: ? ?General:  Pt is alert, follows commands appropriately, not in acute distress ?HEENT: No icterus, No thrush, No neck mass, [place]/AT ?Cardiovascular: RRR, S1/S2, no rubs, no gallops ?Respiratory: bilateral crackles. No wheeze ?Abdomen: Soft/+BS, non tender, non distended, no guarding ?Extremities: 1 + LE edema, No lymphangitis, No petechiae, No rashes, no synovitis ? ? ?Data Reviewed: ?I have personally reviewed following labs and imaging studies ?Basic Metabolic Panel: ?Recent Labs  ?Lab 05/26/21 ?8937 05/28/21 ?0401 05/29/21 ?3428 05/30/21 ?0809 05/31/21 ?7681  ?  NA 134* 133* 132* 131* 132*  ?K 5.1 5.0 5.1 5.6* 5.7*  ?CL 104 103 104 102 103  ?CO2 23 23 20* 18* 20*  ?GLUCOSE 142* 128* 130* 196* 151*  ?BUN 80* 92* 100* 108* 118*  ?CREATININE 2.47* 2.68* 2.91* 3.17* 3.21*  ?CALCIUM 9.2 8.8* 9.0 9.1 9.0  ? ?Liver Function Tests: ?Recent Labs  ?Lab 05/30/21 ?0809  05/31/21 ?8119  ?AST 54* 58*  ?ALT 53* 63*  ?ALKPHOS 137* 156*  ?BILITOT 1.3* 1.2  ?PROT 6.2* 6.3*  ?ALBUMIN 2.8* 2.9*  ? ?No results for input(s): LIPASE, AMYLASE in the last 168 hours. ?No results for input(s): AMMONIA in the last 168 hours. ?Coagulation Profile: ?No results for input(s): INR, PROTIME in the last 168 hours. ?CBC: ?Recent Labs  ?Lab 05/26/21 ?0339  ?WBC 5.3  ?HGB 10.1*  ?HCT 30.2*  ?MCV 96.8  ?PLT 113*  ? ?Cardiac Enzymes: ?No results for input(s): CKTOTAL, CKMB, CKMBINDEX, TROPONINI in the last 168 hours. ?BNP: ?Invalid input(s): POCBNP ?CBG: ?No results for input(s): GLUCAP in the last 168 hours. ?HbA1C: ?No results for input(s): HGBA1C in the last 72 hours. ?Urine analysis: ?   ?Component Value Date/Time  ? APPEARANCEUR Clear 05/04/2020 0913  ? GLUCOSEU Negative 05/04/2020 0913  ? BILIRUBINUR Negative 05/04/2020 0913  ? PROTEINUR Negative 05/04/2020 0913  ? NITRITE Negative 05/04/2020 0913  ? LEUKOCYTESUR Negative 05/04/2020 0913  ? ?Sepsis Labs: ?'@LABRCNTIP'$ (procalcitonin:4,lacticidven:4) ?)No results found for this or any previous visit (from the past 240 hour(s)).  ? ?Scheduled Meds: ? amiodarone  200 mg Oral BID  ? feeding supplement  237 mL Oral BID BM  ? finasteride  5 mg Oral Daily  ? mouth rinse  15 mL Mouth Rinse BID  ? tamsulosin  0.4 mg Oral QPC supper  ? ?Continuous Infusions: ? sodium chloride Stopped (05/31/21 1653)  ? ? ?Procedures/Studies: ?US Venous Img Lower Unilateral Left (DVT) ? ?Result Date: 05/03/2021 ?CLINICAL DATA:  88 year old male with a history of left leg edema EXAM: LEFT LOWER EXTREMITY VENOUS DOPPLER ULTRASOUND TECHNIQUE: Gray-scale sonography with graded compression, as well as color Doppler and duplex ultrasound were performed to evaluate the lower extremity deep venous systems from the level of the common femoral vein and including the common femoral, femoral, profunda femoral, popliteal and calf veins including the posterior tibial, peroneal and gastrocnemius  veins when visible. The superficial great saphenous vein was also interrogated. Spectral Doppler was utilized to evaluate flow at rest and with distal augmentation maneuvers in the common femoral, femoral and popliteal veins. COMPARISON:  None. FINDINGS: Contralateral Common Femoral Vein: Respiratory phasicity is normal and symmetric with the symptomatic side. No evidence of thrombus. Normal compressibility. Common Femoral Vein: No evidence of thrombus. Normal compressibility, respiratory phasicity and response to augmentation. Saphenofemoral Junction: No evidence of thrombus. Normal compressibility and flow on color Doppler imaging. Profunda Femoral Vein: No evidence of thrombus. Normal compressibility and flow on color Doppler imaging. Femoral Vein: No evidence of thrombus. Normal compressibility, respiratory phasicity and response to augmentation. Popliteal Vein: No evidence of thrombus. Normal compressibility, respiratory phasicity and response to augmentation. Calf Veins: No evidence of thrombus. Normal compressibility and flow on color Doppler imaging. Superficial Great Saphenous Vein: No evidence of thrombus. Normal compressibility and flow on color Doppler imaging. Other Findings:  None. IMPRESSION: Directed duplex left lower extremity negative for DVT Electronically Signed   By: Corrie Mckusick D.O.   On: 05/03/2021 11:28  ? ?DG CHEST PORT 1 VIEW ? ?Result Date: 05/30/2021 ?CLINICAL DATA:  Shortness of breath. Respiratory failure with hypoxia. EXAM: PORTABLE CHEST 1 VIEW COMPARISON:  05/23/2021 FINDINGS: Persistent cardiomegaly. Aortic atherosclerosis and tortuosity. Persistent moderate bilateral pleural effusions with dependent pulmonary atelectasis. Findings appear quite similar to the study of 1 week ago. IMPRESSION: Similar appearance with persistent moderate bilateral effusions and lower lobe atelectasis. Electronically Signed   By: Nelson Chimes M.D.   On: 05/30/2021 08:01  ? ?DG CHEST PORT 1  VIEW ? ?Result Date: 05/23/2021 ?CLINICAL DATA:  Shortness of breath, hypoxia EXAM: PORTABLE CHEST 1 VIEW COMPARISON:  05/20/2021 FINDINGS: Increased bilateral pleural effusions and bibasilar atelectasis. Similar probable

## 2021-06-01 NOTE — Hospital Course (Signed)
88 y.o. male with medical history significant of coronary artery disease with cardiomyopathy and heart failure with reduced EF of 30 to 35% on echo on 2/23 and moderate aortic stenosis, peripheral artery disease, type 2 diabetes that is well controlled with diet.   ?  ?Patient presents with 1 week of worsening shortness of breath sputum production orthopnea and dyspnea with exertion.  Noted to be markedly hypoxic in the 70s at intake requiring nonrebreather and subsequently BiPAP for respiratory support.  Patient admitted for close monitoring, aggressive diuresis and respiratory support in the setting of heart failure exacerbation. ?  ?Patient has been admitted from 05/20/21 to 05/31/21 when after [place] discussion and advance care planning decision made to transition to comfort care and pursuit hospice placement. Patient is now GIP.  On 06/01/21, patient remained hemodynamically stable with stable oxygenation for transfer to residential hospice. ?

## 2021-06-01 NOTE — TOC Transition Note (Signed)
Transition of Care (TOC) - CM/SW Discharge Note ? ? ?Patient Details  ?Name: Charles Lester ?MRN: [id number] ?Date of Birth: [birth year] ? ?Transition of Care (TOC) CM/SW Contact:  ?Tonita Bills D, LCSW ?Phone Number: ?06/01/2021, 4:34 PM ? ? ?Clinical Narrative:    ?[name] has a bed available at the facility and arranged for patient transport. Daughter, [name], notified.  ? ? ?Final next level of care: [place] ?Barriers to Discharge: No Barriers Identified ? ? ?Patient Goals and CMS Choice ?  ?  ?  ? ?Discharge Placement ?  ?           ?  ?  ?  ?  ? ?Discharge Plan and Services ?  ?  ?           ?  ?  ?  ?  ?  ?  ?  ?  ?  ?  ? ?Social Determinants of Health (SDOH) Interventions ?  ? ? ?Readmission Risk Interventions ? ?  05/23/2021  ? 11:58 AM  ?Readmission Risk Prevention Plan  ?Transportation Screening Complete  ?Home Care Screening Complete  ?Medication Review (RN CM) Complete  ? ? ? ? ? ?

## 2021-06-06 NOTE — Discharge Summary (Signed)
?Physician Discharge Summary ?  ?Patient: Charles Lester MRN: [id number] DOB: 16-Jul-1924  ?Admit date:     05/31/2021  ?Discharge date: 06/01/2021  ?Discharge Physician: Barton Dubois  ? ?PCP: [name], MD  ? ?Recommendations at discharge:  ?Full comfort care  ?Life expectancy < 2 weeks  ? ?Discharge Diagnoses: ?Principal Problem: ?  End of life care ?Active Problems: ?  DMII (diabetes mellitus, type 2) ([place]) ?  HTN (hypertension) ?  Mixed hyperlipidemia ?  Acute respiratory failure with hypoxia ([place]) ?  Acute clinical systolic heart failure ([place]) ? ? ?Hospital Course: ?88 y.o. male with medical history significant of coronary artery disease with cardiomyopathy and heart failure with reduced EF of 30 to 35% on echo on 2/23 and moderate aortic stenosis, peripheral artery disease, type 2 diabetes that is well controlled with diet.   ?  ?Patient presents with 1 week of worsening shortness of breath sputum production orthopnea and dyspnea with exertion.  Noted to be markedly hypoxic in the 70s at intake requiring nonrebreather and subsequently BiPAP for respiratory support.  Patient admitted for close monitoring, aggressive diuresis and respiratory support in the setting of heart failure exacerbation. ?  ?Patient has been admitted from 05/20/21 to 05/31/21 when after [place] discussion and advance care planning decision made to transition to comfort care and pursuit hospice placement. Patient is now GIP.  On 06/01/21, patient remained hemodynamically stable with stable oxygenation for transfer to residential hospice. ? ?Assessment and Plan: ?Acute hypoxic respiratory failure in the setting of combined systolic/diastolic heart failure exacerbation, POA ?-Continue oxygen supplementation and provide symptomatic management for shortness of breath and air hunger with the use of Ativan and Dilaudid. ?-Overall prognosis is very poor; anticipating days/weeks for life expectancy. ?-Appreciate cardiology service and assistance; 2D echo  demonstrating worsening inpatient ejection fraction down to 10-15% given concern for cardiogenic shock presentation. ?-Family meeting with palliative care and cardiology has triggered decision to transition to full comfort care and symptomatic management only. ?-Family has accepted hospice and patient has been accepted to [facility].  ?-will focus on full comfort care. ?  ?D-dimer elevated ?-Concern for underlying pulmonary embolism however patient had recent CTA within the last 2 months with negative findings, cannot repeat CTA now given elevated creatinine as below; VQ also limited value at this time given bilateral effusions. ?-At this point will transition to full comfort care and symptomatic management. ?  ?AKI without history of CKD ?-In the setting of above diuresis and decreased perfusion with acute on chronic CHF. ?-Continue to follow renal function trend ?-Last creatinine has continued to worse and creatinine is currently 3.21; with a BUN of 118 ?-No further blood work ?-Last potassium 5.7 ?-Sodium level 132. ?-Transitioning to full comfort care and symptomatic management only. ?-Lactic acid 2.5 ?  ?Essential hypertension  ?-Continue to be significantly hypotensive  ?-Transitioning to full comfort care and symptomatic management ?-Antihypertensive agents and pressor supports has been discontinued at this point. ?  ?BPH ?-No complaints of urinary retention symptoms ?-Continue the use of Proscar and Flomax. ?  ?History of CAD, stable ?-No chest pain ?-Transitioning to full comfort care and symptomatic management only ?-All medications except for amiodarone has been discontinued at this moment. ?  ?Non-insulin-dependent diabetes mellitus type 2   ?-Diet controlled prior to admission. ?-Focuses on comfort care and symptomatic management ?-no further CBG's check and not insulin therapy to be provided ?-diet has been liberalize. ?-focusing on comfort care and symptomatic management. ?  ?Sacral decubitus  ulcer stage II-III, chronic, POA  ?-Continue to follow recommendation by wound care service to keep area clean and dry. ?-Preventive measures and conservative positioning instructed. ?-No signs of superimposed infection appreciated; but demonstrating serosanguineous drainage.. ?-plan is for comfort care. ?  ?Aortic stenosis  ?-Appreciate assistance and recommendations by cardiology service ?-Not a candidate for surgical intervention ?-Plan is to transition to full comfort care. ?-Very poor prognosis, life expectancy days/weeks. ?  ?Peripheral vascular disease  ?-Transitioning to full comfort care ?-Aspirin, Plavix and statin has been discontinued. ?-Plan is to focus on symptomatic management. ?  ?Atrial tachycardia: ?-Unable to fully exclude atypical atrial flutter; CHA2DS2-VASc score above 6.  Patient is not a candidate for anticoagulation. ?-After discussing with palliative care, family and cardiology service decision was made to pursuit full comfort care and symptomatic management only. ?-All medications except for meds intended for comfort has been discontinued.  ? ?Consultants: cardiology, palliative care and Hospice ?Procedures performed: see below for x-ray reports. 2-D echo demonstrating EF 10-15 % with global hypokinesis   ?Disposition: Hospice care ?Diet recommendation: Regular diet ? ?DISCHARGE MEDICATION: ?Allergies as of 06/01/2021   ?No Known Allergies ?  ? ?  ?Medication List  ?  ? ?STOP taking these medications   ? ?amLODipine 2.5 MG tablet ?Commonly known as: NORVASC ?  ?aspirin EC 81 MG tablet ?  ?atorvastatin 20 MG tablet ?Commonly known as: LIPITOR ?  ?clopidogrel 75 MG tablet ?Commonly known as: PLAVIX ?  ?ferrous sulfate 325 (65 FE) MG tablet ?  ?finasteride 5 MG tablet ?Commonly known as: PROSCAR ?  ?Fish Oil 1000 MG Caps ?  ?furosemide 20 MG tablet ?Commonly known as: LASIX ?  ?hydrALAZINE 10 MG tablet ?Commonly known as: APRESOLINE ?  ?isosorbide mononitrate 30 MG 24 hr tablet ?Commonly known  as: IMDUR ?  ?lisinopril 40 MG tablet ?Commonly known as: ZESTRIL ?  ?metoprolol succinate 50 MG 24 hr tablet ?Commonly known as: TOPROL-XL ?  ?niacin 1000 MG CR tablet ?Commonly known as: NIASPAN ?  ?tamsulosin 0.4 MG Caps capsule ?Commonly known as: FLOMAX ?  ?vitamin C 500 MG tablet ?Commonly known as: ASCORBIC ACID ?  ?Vitamin D3 10 MCG (400 UNIT) Caps ?  ? ?  ? ? ?Discharge Exam: ?Filed Weights  ? 05/31/21 1647  ?Weight: 66.5 kg  ? ?General exam: Alert, awake, oriented x 3; patient continue reporting short winded sensation with minimal assistance and is complaining of pain in his pressure injury.  Reports no palpitations currently. ?Respiratory system: Decreased breath sounds at the bases, no frank crackles, no wheezing; 2 L of cannula in place. ?Cardiovascular system: Rate controlled currently; no rubs or gallops, positive systolic murmur.  No JVD on exam. ?Gastrointestinal system: Abdomen is nondistended, soft and nontender. No organomegaly or masses felt. Normal bowel sounds heard. ?Central nervous system: Alert and oriented. No focal neurological deficits. ?Extremities: No cyanosis or clubbing; trace pedal edema appreciated bilaterally. ?Skin: No petechiae; stage II-III pressure injury appreciated in his sacral area; positive serosanguineous drainage. ?Psychiatry: Judgement and insight appear normal.  Intermittently anxious.  Flat mood. ? ?Condition at discharge: stable ? ?The results of significant diagnostics from this hospitalization (including imaging, microbiology, ancillary and laboratory) are listed below for reference.  ? ?Imaging Studies: ?DG CHEST PORT 1 VIEW ? ?Result Date: 05/30/2021 ?CLINICAL DATA:  Shortness of breath. Respiratory failure with hypoxia. EXAM: PORTABLE CHEST 1 VIEW COMPARISON:  05/23/2021 FINDINGS: Persistent cardiomegaly. Aortic atherosclerosis and tortuosity. Persistent moderate bilateral pleural effusions with dependent pulmonary atelectasis.  Findings appear quite similar to  the study of 1 week ago. IMPRESSION: Similar appearance with persistent moderate bilateral effusions and lower lobe atelectasis. Electronically Signed   By: Nelson Chimes M.D.   On: 05/30/2021 08:01  ? ?DG CHEST

## 2021-06-30 DEATH — deceased

## 2021-07-22 ENCOUNTER — Other Ambulatory Visit: Payer: Self-pay

## 2021-07-22 MED ORDER — METOPROLOL SUCCINATE ER 50 MG PO TB24: 50.0000 mg | ORAL_TABLET | Freq: Every day | ORAL | 5 refills | Status: AC

## 2021-07-22 MED ORDER — ISOSORBIDE MONONITRATE ER 30 MG PO TB24: 15.0000 mg | ORAL_TABLET | Freq: Every day | ORAL | 5 refills | Status: AC

## 2021-07-22 MED ORDER — CLOPIDOGREL BISULFATE 75 MG PO TABS: 75.0000 mg | ORAL_TABLET | Freq: Every day | ORAL | 5 refills | Status: AC

## 2021-08-01 ENCOUNTER — Ambulatory Visit: Admitting: Cardiology
